# Patient Record
Sex: Female | Born: 1935 | Race: White | Hispanic: No | Smoking: Never smoker
Health system: Southern US, Community
[De-identification: ages and names within clinical notes are randomized; demographics above are authoritative.]

## PROBLEM LIST (undated history)

## (undated) DIAGNOSIS — M8580 Other specified disorders of bone density and structure, unspecified site: Secondary | ICD-10-CM

## (undated) DIAGNOSIS — R0600 Dyspnea, unspecified: Secondary | ICD-10-CM

## (undated) DIAGNOSIS — R413 Other amnesia: Secondary | ICD-10-CM

## (undated) DIAGNOSIS — R0609 Other forms of dyspnea: Secondary | ICD-10-CM

## (undated) DIAGNOSIS — F0390 Unspecified dementia without behavioral disturbance: Secondary | ICD-10-CM

## (undated) DIAGNOSIS — F4321 Adjustment disorder with depressed mood: Secondary | ICD-10-CM

## (undated) DIAGNOSIS — F4320 Adjustment disorder, unspecified: Secondary | ICD-10-CM

## (undated) HISTORY — DX: Other amnesia: R41.3

## (undated) HISTORY — PX: OOPHORECTOMY: SHX86

## (undated) HISTORY — DX: Adjustment disorder, unspecified: F43.20

## (undated) HISTORY — DX: Other forms of dyspnea: R06.09

## (undated) HISTORY — DX: Other specified disorders of bone density and structure, unspecified site: M85.80

## (undated) HISTORY — DX: Adjustment disorder with depressed mood: F43.21

## (undated) HISTORY — PX: ABDOMINAL HYSTERECTOMY: SHX81

## (undated) HISTORY — DX: Unspecified dementia, unspecified severity, without behavioral disturbance, psychotic disturbance, mood disturbance, and anxiety: F03.90

## (undated) HISTORY — DX: Dyspnea, unspecified: R06.00

---

## 1997-11-25 ENCOUNTER — Ambulatory Visit (HOSPITAL_COMMUNITY): Admission: RE | Admit: 1997-11-25 | Discharge: 1997-11-25 | Payer: Self-pay | Admitting: Gynecology

## 1999-02-28 ENCOUNTER — Other Ambulatory Visit: Admission: RE | Admit: 1999-02-28 | Discharge: 1999-02-28 | Payer: Self-pay | Admitting: Gynecology

## 2000-04-04 ENCOUNTER — Other Ambulatory Visit: Admission: RE | Admit: 2000-04-04 | Discharge: 2000-04-04 | Payer: Self-pay | Admitting: Gynecology

## 2000-07-09 ENCOUNTER — Ambulatory Visit (HOSPITAL_COMMUNITY): Admission: RE | Admit: 2000-07-09 | Discharge: 2000-07-09 | Payer: Self-pay | Admitting: Gastroenterology

## 2001-05-29 ENCOUNTER — Other Ambulatory Visit: Admission: RE | Admit: 2001-05-29 | Discharge: 2001-05-29 | Payer: Self-pay | Admitting: Gynecology

## 2002-06-08 ENCOUNTER — Other Ambulatory Visit: Admission: RE | Admit: 2002-06-08 | Discharge: 2002-06-08 | Payer: Self-pay | Admitting: Gynecology

## 2004-06-12 ENCOUNTER — Other Ambulatory Visit: Admission: RE | Admit: 2004-06-12 | Discharge: 2004-06-12 | Payer: Self-pay | Admitting: Gynecology

## 2005-06-28 ENCOUNTER — Other Ambulatory Visit: Admission: RE | Admit: 2005-06-28 | Discharge: 2005-06-28 | Payer: Self-pay | Admitting: Gynecology

## 2006-07-26 ENCOUNTER — Other Ambulatory Visit: Admission: RE | Admit: 2006-07-26 | Discharge: 2006-07-26 | Payer: Self-pay | Admitting: Gynecology

## 2007-07-31 ENCOUNTER — Other Ambulatory Visit: Admission: RE | Admit: 2007-07-31 | Discharge: 2007-07-31 | Payer: Self-pay | Admitting: Gynecology

## 2009-02-22 ENCOUNTER — Ambulatory Visit: Payer: Self-pay | Admitting: Gynecology

## 2009-02-22 ENCOUNTER — Other Ambulatory Visit: Admission: RE | Admit: 2009-02-22 | Discharge: 2009-02-22 | Payer: Self-pay | Admitting: Gynecology

## 2009-05-02 ENCOUNTER — Ambulatory Visit: Payer: Self-pay | Admitting: Gynecology

## 2010-08-04 ENCOUNTER — Encounter (INDEPENDENT_AMBULATORY_CARE_PROVIDER_SITE_OTHER): Payer: Medicare Other | Admitting: Gynecology

## 2010-08-04 DIAGNOSIS — K648 Other hemorrhoids: Secondary | ICD-10-CM

## 2010-08-04 DIAGNOSIS — R82998 Other abnormal findings in urine: Secondary | ICD-10-CM

## 2010-08-04 DIAGNOSIS — N952 Postmenopausal atrophic vaginitis: Secondary | ICD-10-CM

## 2010-08-04 DIAGNOSIS — M949 Disorder of cartilage, unspecified: Secondary | ICD-10-CM

## 2010-08-04 DIAGNOSIS — M899 Disorder of bone, unspecified: Secondary | ICD-10-CM

## 2010-08-25 NOTE — Procedures (Signed)
[place]. [facility]  Patient:    Rita Torres, Rita Torres                     MRN: [id number] Proc. Date: 07/09/00 Adm. Date:  [id number] Attending:  Charna Elizabeth CC:         Timothy P. Fontaine, M.D.                           Procedure Report  DATE OF BIRTH:  01-Jan-1933  REFERRING PHYSICIAN:  Nadyne Coombes. Fontaine, M.D.  PROCEDURE PERFORMED:  Colonoscopy.  ENDOSCOPIST:  Anselmo Rod, M.D.  INSTRUMENT USED:  Olympus video colonoscope.  INDICATIONS FOR PROCEDURE:  Screening colonoscopy being performed in a 77 year old white female to rule out colonic polyps, masses, hemorrhoids, etc.  PREPROCEDURE PREPARATION:  Informed consent was procured from the patient. The patient was fasted for eight hours prior to the procedure and prepped with a bottle of magnesium citrate and a gallon of NuLytely the night prior to the procedure.  PREPROCEDURE PHYSICAL:  The patient had stable vital signs.  Neck supple. Chest clear to auscultation.  S1, S2 regular.  Abdomen soft with normal abdominal bowel sounds.  DESCRIPTION OF PROCEDURE:  The patient was placed in the left lateral decubitus position and sedated with 40 mg of Demerol and 5 mg of Versed intravenously.  Once the patient was adequately sedated and maintained on low-flow oxygen and continuous cardiac monitoring, the Olympus video colonoscope was advanced from the rectum to the cecum and terminal ileum without difficulty.  The entire exam was normal except for small nonbleeding internal and external hemorrhoids.  No other abnormalities were appreciated.  No masses or polyps were seen.  IMPRESSION: 1. Normal colonoscopy up to the terminal ileum. 2. Small nonbleeding internal and external hemorrhoids.  RECOMMENDATIONS:  The patient has been advised to increase the fluid and fiber in the diet and follow up in the office as the need arises.DD:  07/09/00 TD:  07/09/00 Job: [id number]

## 2011-03-09 ENCOUNTER — Other Ambulatory Visit: Payer: Self-pay | Admitting: *Deleted

## 2011-03-09 ENCOUNTER — Encounter: Payer: Self-pay | Admitting: Gynecology

## 2011-03-12 ENCOUNTER — Encounter: Payer: Self-pay | Admitting: Gynecology

## 2011-03-13 ENCOUNTER — Encounter: Payer: Self-pay | Admitting: Gynecology

## 2012-05-27 ENCOUNTER — Encounter: Payer: Self-pay | Admitting: Gynecology

## 2012-06-04 ENCOUNTER — Encounter: Payer: Self-pay | Admitting: Gynecology

## 2012-06-06 ENCOUNTER — Encounter: Payer: Self-pay | Admitting: Gynecology

## 2012-06-06 ENCOUNTER — Ambulatory Visit (INDEPENDENT_AMBULATORY_CARE_PROVIDER_SITE_OTHER): Payer: Medicare Other | Admitting: Gynecology

## 2012-06-06 VITALS — BP 120/70 | Ht 64.0 in | Wt 169.0 lb

## 2012-06-06 DIAGNOSIS — N3281 Overactive bladder: Secondary | ICD-10-CM

## 2012-06-06 DIAGNOSIS — M899 Disorder of bone, unspecified: Secondary | ICD-10-CM

## 2012-06-06 DIAGNOSIS — N952 Postmenopausal atrophic vaginitis: Secondary | ICD-10-CM

## 2012-06-06 DIAGNOSIS — N318 Other neuromuscular dysfunction of bladder: Secondary | ICD-10-CM

## 2012-06-06 DIAGNOSIS — M8580 Other specified disorders of bone density and structure, unspecified site: Secondary | ICD-10-CM | POA: Insufficient documentation

## 2012-06-06 NOTE — Progress Notes (Signed)
Rita Torres 02/11/1933 [id number]        79 y.o.  G2P2002 for followup exam.  Several issues that are below  Past medical history,surgical history, medications, allergies, family history and social history were all reviewed and documented in the EPIC chart. ROS:  Was performed and pertinent positives and negatives are included in the history.  Exam: Kim assistant Filed Vitals:   06/06/12 1401  BP: 120/70  Height: 5\' 4"  (1.626 m)  Weight: 169 lb (76.658 kg)   General appearance  Normal Skin grossly normal Head/Neck normal with no cervical or supraclavicular adenopathy thyroid normal Lungs  clear Cardiac RR, without RMG Abdominal  soft, nontender, without masses, organomegaly or hernia Breasts  examined lying and sitting without masses, retractions, discharge or axillary adenopathy. Pelvic  Ext/BUS/vagina  normal with atrophic changes  Adnexa  Without masses or tenderness    Anus and perineum  normal   Rectovaginal  normal sphincter tone without palpated masses or tenderness.    Assessment/Plan:  79 y.o. [id number] female for annual exam.   1. Atrophic genital changes. Patient is asymptomatic and we'll continue to follow. 2. Detrusor instability. Having to run to the bathroom at times but no incontinence. Discussed strategies to include decrease caffeine and more frequent bladder and being. Discussed medication and she is not interested in this. 3. Osteopenia. DEXA 02/2011 T score -1.3. Stable from prior DEXA. Had been on bisphosphonate previously but is off now. Plan repeat DEXA  in another year or 2.  Increase calcium vitamin D reviewed. 4. Mammography 05/2012. Continue with annual mammography. SBE reviewed. 5. Pap smear 2010. No Pap smear done today. No history of significant abnormalities. Status post hysterectomy for benign indications. Over the age of 34. We'll stop screening and she is comfortable with this. 6. Colonoscopy 2012. Repeat at their recommended  interval. 7. Health maintenance. No blood work done as it is all done through her primary physician's office. Followup one year, sooner as needed.    Dara Lords MD, 2:25 PM 06/06/2012

## 2012-06-06 NOTE — Patient Instructions (Signed)
Followup in 1 year for her exam.

## 2012-06-07 LAB — URINALYSIS W MICROSCOPIC + REFLEX CULTURE
Bacteria, UA: NONE SEEN
Bilirubin Urine: NEGATIVE
Casts: NONE SEEN
Crystals: NONE SEEN
Glucose, UA: NEGATIVE mg/dL
Hgb urine dipstick: NEGATIVE
Ketones, ur: NEGATIVE mg/dL
Nitrite: NEGATIVE
Protein, ur: NEGATIVE mg/dL
Specific Gravity, Urine: 1.018 (ref 1.005–1.030)
Urobilinogen, UA: 0.2 mg/dL (ref 0.0–1.0)
pH: 5.5 (ref 5.0–8.0)

## 2012-06-08 LAB — URINE CULTURE: Colony Count: 40000

## 2013-03-13 ENCOUNTER — Ambulatory Visit (INDEPENDENT_AMBULATORY_CARE_PROVIDER_SITE_OTHER): Payer: Medicare Other | Admitting: Women's Health

## 2013-03-13 ENCOUNTER — Encounter: Payer: Self-pay | Admitting: Women's Health

## 2013-03-13 DIAGNOSIS — R300 Dysuria: Secondary | ICD-10-CM

## 2013-03-13 LAB — URINALYSIS W MICROSCOPIC + REFLEX CULTURE
Bilirubin Urine: NEGATIVE
Glucose, UA: NEGATIVE mg/dL
Hgb urine dipstick: NEGATIVE
Nitrite: NEGATIVE
Protein, ur: NEGATIVE mg/dL
Specific Gravity, Urine: 1.025 (ref 1.005–1.030)
Urobilinogen, UA: 0.2 mg/dL (ref 0.0–1.0)
pH: 5.5 (ref 5.0–8.0)

## 2013-03-13 MED ORDER — CIPROFLOXACIN HCL 250 MG PO TABS: 250.0000 mg | ORAL_TABLET | Freq: Two times a day (BID) | ORAL | Status: DC

## 2013-03-13 MED ORDER — BETAMETHASONE VALERATE 0.1 % EX OINT: 1.0000 "application " | TOPICAL_OINTMENT | Freq: Two times a day (BID) | CUTANEOUS | Status: DC

## 2013-03-13 NOTE — Progress Notes (Signed)
Patient ID: Rita Torres, female   DOB: 03-02-1933, 79 y.o.   MRN: [id number] Presents with complaint of urinary frequency with urgency and slight burning with urination. States urine has a stronger odor than usual. Denies vaginal discharge, abdominal pain or fever. Right shoulder pain and immobility, had a steroid injection, and now has a  rash in the axilla. Helping to care for her husband who has numerous health  and mobility problems  Exam: Appears well, UA moderate leukocytes, 7-10 WBCs, heavy mucus, few bacteria. Right Axilla 3 cm erythemic area.  UTI  Plan: Cipro 250 twice daily for 3 days #6 prescription, proper use given and reviewed. Valisone 0.1% ointment twice daily small amount to right axilla. Avoid deodorant soft, make sure dry after showering. If no relief followup with primary care.

## 2013-03-13 NOTE — Patient Instructions (Signed)
Urinary Tract Infection  Urinary tract infections (UTIs) can develop anywhere along your urinary tract. Your urinary tract is your body's drainage system for removing wastes and extra water. Your urinary tract includes two kidneys, two ureters, a bladder, and a urethra. Your kidneys are a pair of bean-shaped organs. Each kidney is about the size of your fist. They are located below your ribs, one on each side of your spine.  CAUSES  Infections are caused by microbes, which are microscopic organisms, including fungi, viruses, and bacteria. These organisms are so small that they can only be seen through a microscope. Bacteria are the microbes that most commonly cause UTIs.  SYMPTOMS   Symptoms of UTIs may vary by age and gender of the patient and by the location of the infection. Symptoms in [place] women typically include a frequent and intense urge to urinate and a painful, burning feeling in the bladder or urethra during urination. Older women and men are more likely to be tired, shaky, and weak and have muscle aches and abdominal pain. A fever may mean the infection is in your kidneys. Other symptoms of a kidney infection include pain in your back or sides below the ribs, nausea, and vomiting.  DIAGNOSIS  To diagnose a UTI, your caregiver will ask you about your symptoms. Your caregiver also will ask to provide a urine sample. The urine sample will be tested for bacteria and white blood cells. White blood cells are made by your body to help fight infection.  TREATMENT   Typically, UTIs can be treated with medication. Because most UTIs are caused by a bacterial infection, they usually can be treated with the use of antibiotics. The choice of antibiotic and length of treatment depend on your symptoms and the type of bacteria causing your infection.  HOME CARE INSTRUCTIONS   If you were prescribed antibiotics, take them exactly as your caregiver instructs you. Finish the medication even if you feel better after you  have only taken some of the medication.   Drink enough water and fluids to keep your urine clear or pale yellow.   Avoid caffeine, tea, and carbonated beverages. They tend to irritate your bladder.   Empty your bladder often. Avoid holding urine for long periods of time.   Empty your bladder before and after sexual intercourse.   After a bowel movement, women should cleanse from front to back. Use each tissue only once.  SEEK MEDICAL CARE IF:    You have back pain.   You develop a fever.   Your symptoms do not begin to resolve within 3 days.  SEEK IMMEDIATE MEDICAL CARE IF:    You have severe back pain or lower abdominal pain.   You develop chills.   You have nausea or vomiting.   You have continued burning or discomfort with urination.  MAKE SURE YOU:    Understand these instructions.   Will watch your condition.   Will get help right away if you are not doing well or get worse.  Document Released: 01/03/2005 Document Revised: 09/25/2011 Document Reviewed: 05/04/2011  Exit[employer] Patient Information 2014 [employer].

## 2013-03-14 LAB — URINE CULTURE
Colony Count: NO GROWTH
Organism ID, Bacteria: NO GROWTH

## 2013-04-30 ENCOUNTER — Ambulatory Visit (INDEPENDENT_AMBULATORY_CARE_PROVIDER_SITE_OTHER): Payer: Medicare Other | Admitting: Gynecology

## 2013-04-30 ENCOUNTER — Encounter: Payer: Self-pay | Admitting: Gynecology

## 2013-04-30 DIAGNOSIS — R300 Dysuria: Secondary | ICD-10-CM

## 2013-04-30 LAB — URINALYSIS W MICROSCOPIC + REFLEX CULTURE
Bilirubin Urine: NEGATIVE
Casts: NONE SEEN
Glucose, UA: NEGATIVE mg/dL
Hgb urine dipstick: NEGATIVE
Ketones, ur: 15 mg/dL — AB
Leukocytes, UA: NEGATIVE
Nitrite: NEGATIVE
Protein, ur: 30 mg/dL — AB
RBC / HPF: NONE SEEN RBC/hpf (ref <3)
Specific Gravity, Urine: >1.03 — ABNORMAL HIGH (ref 1.005–1.030)
Urobilinogen, UA: 0.2 mg/dL (ref 0.0–1.0)
WBC, UA: NONE SEEN WBC/hpf (ref <3)
pH: 5.5 (ref 5.0–8.0)

## 2013-04-30 MED ORDER — NITROFURANTOIN MONOHYD MACRO 100 MG PO CAPS: 100.0000 mg | ORAL_CAPSULE | Freq: Two times a day (BID) | ORAL | Status: DC

## 2013-04-30 NOTE — Progress Notes (Signed)
Patient presents with several days of dysuria. Also mild frequency. No low back pain abdominal discomfort fever chills nausea vomiting diarrhea or constipation. Was treated for UTI earlier in December.  Exam was Radiographer, therapeuticKim Assistant Spine straight without CVA tenderness. Abdomen soft nontender without masses guarding rebound organomegaly. Pelvic external BUS vagina with atrophic changes. Bimanual exam without masses or tenderness.  Assessment and plan: Symptoms suggestive of an early mild UTI. Urinalysis is unremarkable. No evidence of vaginal infection. Will cover with Macrobid 100 mg twice a day x7 days. Followup if symptoms persist worsen or recur.

## 2013-04-30 NOTE — Patient Instructions (Signed)
Start antibiotic twice daily for 7 days. Followup if symptoms persist, worsen or recur.

## 2013-05-01 ENCOUNTER — Telehealth: Payer: Self-pay | Admitting: *Deleted

## 2013-05-01 NOTE — Telephone Encounter (Signed)
Left message for pt to call.

## 2013-05-01 NOTE — Telephone Encounter (Signed)
Pt informed with the below note. 

## 2013-05-01 NOTE — Telephone Encounter (Signed)
Message copied by Aura CampsWEBB, Adriana Lina L on Fri May 01, 2013  9:41 AM ------      Message from: [name], TIMOTHY P      Created: Thu Apr 30, 2013  4:46 PM       Call patient. She left the office without talking to me. I did want her to start antibiotics twice daily for 7 days in the event that she's has an early urinary tract infection. I prescribed this to her pharmacy and she can pick it up there. ------

## 2013-05-02 LAB — URINE CULTURE: Colony Count: 40000

## 2013-11-16 ENCOUNTER — Ambulatory Visit (INDEPENDENT_AMBULATORY_CARE_PROVIDER_SITE_OTHER): Payer: Medicare Other | Admitting: Gynecology

## 2013-11-16 ENCOUNTER — Encounter: Payer: Self-pay | Admitting: Gynecology

## 2013-11-16 VITALS — BP 130/82

## 2013-11-16 DIAGNOSIS — N318 Other neuromuscular dysfunction of bladder: Secondary | ICD-10-CM

## 2013-11-16 DIAGNOSIS — R350 Frequency of micturition: Secondary | ICD-10-CM

## 2013-11-16 DIAGNOSIS — N3281 Overactive bladder: Secondary | ICD-10-CM | POA: Insufficient documentation

## 2013-11-16 LAB — URINALYSIS W MICROSCOPIC + REFLEX CULTURE
Bilirubin Urine: NEGATIVE
Glucose, UA: NEGATIVE mg/dL
Hgb urine dipstick: NEGATIVE
Ketones, ur: NEGATIVE mg/dL
Leukocytes, UA: NEGATIVE
Nitrite: NEGATIVE
Protein, ur: NEGATIVE mg/dL
Specific Gravity, Urine: 1.02 (ref 1.005–1.030)
Urobilinogen, UA: 0.2 mg/dL (ref 0.0–1.0)
pH: 5.5 (ref 5.0–8.0)

## 2013-11-16 MED ORDER — MIRABEGRON ER 25 MG PO TB24: 25.0000 mg | ORAL_TABLET | Freq: Every day | ORAL | Status: DC

## 2013-11-16 NOTE — Patient Instructions (Signed)
Mirabegron extended-release tablets What is this medicine? MIRABEGRON (MIR a BEG ron) is used to treat overactive bladder. This medicine reduces the amount of bathroom visits. It may also help to control wetting accidents. This medicine may be used for other purposes; ask your health care provider or pharmacist if you have questions. COMMON BRAND NAME(S): Myrbetriq What should I tell my health care provider before I take this medicine? They need to know if you have any of these conditions: -difficulty passing urine -high blood pressure -kidney disease -liver disease -an unusual or allergic reaction to mirabegron, other medicines, foods, dyes, or preservatives -pregnant or trying to get pregnant -breast-feeding How should I use this medicine? Take this medicine by mouth with a glass of water. Follow the directions on the prescription label. Do not cut, crush or chew this medicine. You can take it with or without food. If it upsets your stomach, take it with food. Take your medicine at regular intervals. Do not take it more often than directed. Do not stop taking except on your doctor's advice. Talk to your pediatrician regarding the use of this medicine in children. Special care may be needed. Overdosage: If you think you've taken too much of this medicine contact a poison control center or emergency room at once. Overdosage: If you think you have taken too much of this medicine contact a poison control center or emergency room at once. NOTE: This medicine is only for you. Do not share this medicine with others. What if I miss a dose? If you miss a dose, take it as soon as you can. If it is almost time for your next dose, take only that dose. Do not take double or extra doses. What may interact with this medicine? -certain medicines for bladder problems like fesoterodine, oxybutynin, solifenacin, tolterodine -desipramine -digoxin -flecainide -ketoconazole -MAOIs like Carbex, Eldepryl,  Marplan, Nardil, and Parnate -metoprolol -propafenone -thioridazine -warfarin This list may not describe all possible interactions. Give your health care provider a list of all the medicines, herbs, non-prescription drugs, or dietary supplements you use. Also tell them if you smoke, drink alcohol, or use illegal drugs. Some items may interact with your medicine. What should I watch for while using this medicine? It may take 8 weeks to notice the full benefit from this medicine. You may need to limit your intake tea, coffee, caffeinated sodas, and alcohol. These drinks may make your symptoms worse. Visit your doctor or health care professional for regular checks on your progress. Check your blood pressure as directed. Ask your doctor or health care professional what your blood pressure should be and when you should contact him or her. What side effects may I notice from receiving this medicine? Side effects that you should report to your doctor or health care professional as soon as possible: -allergic reactions like skin rash, itching or hives, swelling of the face, lips, or tongue -chest pain or palpitations -severe or sudden headache -high blood pressure -fast, irregular heartbeat -redness, blistering, peeling or loosening of the skin, including inside the mouth -signs of infection - fever or chills, pain or difficulty passing urine -trouble passing urine or change in the amount of urine Side effects that usually do not require medical attention (Report these to your doctor or health care professional if they continue or are bothersome.): -constipation -dry eyes -joint pain -mild headache -nausea -runny nose This list may not describe all possible side effects. Call your doctor for medical advice about side effects. You may report   side effects to FDA at 1-800-FDA-1088. Where should I keep my medicine? Keep out of the reach of children. Store at room temperature between 15 and 30  degrees C (59 and 86 degrees F). Throw away any unused medicine after the expiration date. NOTE: This sheet is a summary. It may not cover all possible information. If you have questions about this medicine, talk to your doctor, pharmacist, or health care provider.  2015, [employer]vier/[employer] Standard. (2011-12-07 15:59:47)  

## 2013-11-16 NOTE — Progress Notes (Signed)
   80 year old who presented to the office today complaining of urinary frequency but no dysuria. Patient denies any back pain, patient denies any nausea, fever, chills or any vomiting. Patient has had this problem for quite some time. She has been seen on several occasions and urine cultures have identified no microorganisms. On further questioning the patient states that regardless of her fluid intake that she has to get up at night at least 3 times to void. The sensation wakes her up. She also urinates frequently throughout the day. She is currently on no medication. Patient states she suffers from no vaginal dryness or irritation and is not sexually active.  Exam: Back: No CVA tenderness Abdomen: Soft nontender no rebound guarding Pelvic: Bartholin urethra Skene glands with atrophic changes Vagina: Vaginal cuff intact, no discharge Bimanual exam no palpable masses or tenderness Rectal exam: Not done  Urinalysis negative  Assessment/plan: Patient with apparent detrusor dyssynergia. We discussed different treatment options. The patient will be started on Myrbetriq 25 mg daily. Literature information was provided. Risks benefits and pros and cons discussed. We will still some in her urine for for culture as well.

## 2013-11-18 ENCOUNTER — Other Ambulatory Visit: Payer: Self-pay | Admitting: Gynecology

## 2013-11-18 MED ORDER — NITROFURANTOIN MONOHYD MACRO 100 MG PO CAPS: 100.0000 mg | ORAL_CAPSULE | Freq: Two times a day (BID) | ORAL | Status: DC

## 2013-11-20 LAB — URINE CULTURE: Colony Count: 15000

## 2013-12-03 ENCOUNTER — Telehealth: Payer: Self-pay | Admitting: *Deleted

## 2013-12-03 ENCOUNTER — Other Ambulatory Visit: Payer: Medicare Other

## 2013-12-03 DIAGNOSIS — N390 Urinary tract infection, site not specified: Secondary | ICD-10-CM

## 2013-12-03 NOTE — Telephone Encounter (Signed)
Pt informed with the below note will be in today at 11:00 am, order placed

## 2013-12-03 NOTE — Telephone Encounter (Signed)
Pt was treated with Macrobid x 7 days at [facility] 11/16/13 completed course. Pt still c/o urgency requesting if refill could be given? Please advise

## 2013-12-03 NOTE — Telephone Encounter (Signed)
I would recommend a urine analysis and urine culture and sensitivity regardless of urinalysis results to see if she still has a bacterial infection and which antibiotics the bacteria is sensitive to.

## 2013-12-04 ENCOUNTER — Other Ambulatory Visit: Payer: Self-pay | Admitting: Women's Health

## 2013-12-04 ENCOUNTER — Telehealth: Payer: Self-pay | Admitting: *Deleted

## 2013-12-04 DIAGNOSIS — R350 Frequency of micturition: Secondary | ICD-10-CM

## 2013-12-04 LAB — URINALYSIS W MICROSCOPIC + REFLEX CULTURE
Bacteria, UA: NONE SEEN
Bilirubin Urine: NEGATIVE
Casts: NONE SEEN
Glucose, UA: NEGATIVE mg/dL
Hgb urine dipstick: NEGATIVE
Ketones, ur: NEGATIVE mg/dL
Leukocytes, UA: NEGATIVE
Nitrite: NEGATIVE
Protein, ur: NEGATIVE mg/dL
Specific Gravity, Urine: 1.021 (ref 1.005–1.030)
Urobilinogen, UA: 0.2 mg/dL (ref 0.0–1.0)
pH: 6 (ref 5.0–8.0)

## 2013-12-04 MED ORDER — PHENAZOPYRIDINE HCL 100 MG PO TABS: 100.0000 mg | ORAL_TABLET | Freq: Three times a day (TID) | ORAL | Status: DC | PRN

## 2013-12-04 NOTE — Telephone Encounter (Signed)
(  Dr.Fontaine patient) Pt was treated for UTI with Macrobid x 7 days on 11/16/13 dropped repeat u/a off on 12/03/13 negative. Pt still c/o waking up at night using bathroom 5-6 times last night, no pain other symptoms. Pt would like recommendations. Please advise

## 2013-12-04 NOTE — Telephone Encounter (Signed)
Telephone call, states feels she is getting another UTI. Reviewed UA  12/03/2013 with negative. States has frequency during the day but more so at night, was up 6 times last night. Will try pyridium 100 3 times daily, prescription called in and will discuss at office visit with Dr. Audie Box in September. Encouraged to drink more fluids during the day and stop after 7 PM, night lites for safety.

## 2013-12-29 ENCOUNTER — Ambulatory Visit (INDEPENDENT_AMBULATORY_CARE_PROVIDER_SITE_OTHER): Payer: Medicare Other | Admitting: Gynecology

## 2013-12-29 ENCOUNTER — Encounter: Payer: Self-pay | Admitting: Gynecology

## 2013-12-29 VITALS — BP 120/66 | Ht 64.0 in | Wt 169.0 lb

## 2013-12-29 DIAGNOSIS — M949 Disorder of cartilage, unspecified: Secondary | ICD-10-CM

## 2013-12-29 DIAGNOSIS — M899 Disorder of bone, unspecified: Secondary | ICD-10-CM

## 2013-12-29 DIAGNOSIS — M8580 Other specified disorders of bone density and structure, unspecified site: Secondary | ICD-10-CM

## 2013-12-29 DIAGNOSIS — N952 Postmenopausal atrophic vaginitis: Secondary | ICD-10-CM

## 2013-12-29 NOTE — Patient Instructions (Signed)
Call to Schedule your mammogram and bone density  Facilities in [place]: 1)  [facility], [place] Phone: [phone] 2)  [facility]. [address] [place] Phone: [phone] 3)  Dr. Isaiah Blakes at [facility] [address] Phone: [phone]     Mammogram A mammogram is an X-ray test to find changes in a woman's breast. You should get a mammogram if:  You are 78 years of age or older  You have risk factors.   Your doctor recommends that you have one.  BEFORE THE TEST  Do not schedule the test the week before your period, especially if your breasts are sore during this time.  On the day of your mammogram:  Wash your breasts and armpits well. After washing, do not put on any deodorant or talcum powder on until after your test.   Eat and drink as you usually do.   Take your medicines as usual.   If you are diabetic and take insulin, make sure you:   Eat before coming for your test.   Take your insulin as usual.   If you cannot keep your appointment, call before the appointment to cancel. Schedule another appointment.  TEST  You will need to undress from the waist up. You will put on a hospital gown.   Your breast will be put on the mammogram machine, and it will press firmly on your breast with a piece of plastic called a compression paddle. This will make your breast flatter so that the machine can X-ray all parts of your breast.   Both breasts will be X-rayed. Each breast will be X-rayed from above and from the side. An X-ray might need to be taken again if the picture is not good enough.   The mammogram will last about 15 to 30 minutes.  AFTER THE TEST Finding out the results of your test Ask when your test results will be ready. Make sure you get your test results.  Document Released: 06/22/2008 Document Revised: 03/15/2011 Document Reviewed:  06/22/2008 [facility] Patient Information 2012 [place].  You may obtain a copy of any labs that were done today by logging onto MyChart as outlined in the instructions provided with your AVS (after visit summary). The office will not call with normal lab results but certainly if there are any significant abnormalities then we will contact you.   Health Maintenance, Female A healthy lifestyle and preventative care can promote health and wellness.  Maintain regular health, dental, and eye exams.  Eat a healthy diet. Foods like vegetables, fruits, whole grains, low-fat dairy products, and lean protein foods contain the nutrients you need without too many calories. Decrease your intake of foods high in solid fats, added sugars, and salt. Get information about a proper diet from your caregiver, if necessary.  Regular physical exercise is one of the most important things you can do for your health. Most adults should get at least 150 minutes of moderate-intensity exercise (any activity that increases your heart rate and causes you to sweat) each week. In addition, most adults need muscle-strengthening exercises on 2 or more days a week.   Maintain a healthy weight. The body mass index (BMI) is a screening tool to identify possible weight problems. It provides an estimate of body fat based on height and weight. Your caregiver can help determine your BMI, and can help you achieve or maintain a healthy  weight. For adults 20 years and older:  A BMI below 18.5 is considered underweight.  A BMI of 18.5 to 24.9 is normal.  A BMI of 25 to 29.9 is considered overweight.  A BMI of 30 and above is considered obese.  Maintain normal blood lipids and cholesterol by exercising and minimizing your intake of saturated fat. Eat a balanced diet with plenty of fruits and vegetables. Blood tests for lipids and cholesterol should begin at age 47 and be repeated every 5 years. If your lipid or cholesterol levels  are high, you are over 50, or you are a high risk for heart disease, you may need your cholesterol levels checked more frequently.Ongoing high lipid and cholesterol levels should be treated with medicines if diet and exercise are not effective.  If you smoke, find out from your caregiver how to quit. If you do not use tobacco, do not start.  Lung cancer screening is recommended for adults aged 59 80 years who are at high risk for developing lung cancer because of a history of smoking. Yearly low-dose computed tomography (CT) is recommended for people who have at least a 30-pack-year history of smoking and are a current smoker or have quit within the past 15 years. A pack year of smoking is smoking an average of 1 pack of cigarettes a day for 1 year (for example: 1 pack a day for 30 years or 2 packs a day for 15 years). Yearly screening should continue until the smoker has stopped smoking for at least 15 years. Yearly screening should also be stopped for people who develop a health problem that would prevent them from having lung cancer treatment.  If you are pregnant, do not drink alcohol. If you are breastfeeding, be very cautious about drinking alcohol. If you are not pregnant and choose to drink alcohol, do not exceed 1 drink per day. One drink is considered to be 12 ounces (355 mL) of beer, 5 ounces (148 mL) of wine, or 1.5 ounces (44 mL) of liquor.  Avoid use of street drugs. Do not share needles with anyone. Ask for help if you need support or instructions about stopping the use of drugs.  High blood pressure causes heart disease and increases the risk of stroke. Blood pressure should be checked at least every 1 to 2 years. Ongoing high blood pressure should be treated with medicines, if weight loss and exercise are not effective.  If you are 84 to 78 years old, ask your caregiver if you should take aspirin to prevent strokes.  Diabetes screening involves taking a blood sample to check your  fasting blood sugar level. This should be done once every 3 years, after age 53, if you are within normal weight and without risk factors for diabetes. Testing should be considered at a younger age or be carried out more frequently if you are overweight and have at least 1 risk factor for diabetes.  Breast cancer screening is essential preventative care for women. You should practice "breast self-awareness." This means understanding the normal appearance and feel of your breasts and may include breast self-examination. Any changes detected, no matter how small, should be reported to a caregiver. Women in their 22s and 30s should have a clinical breast exam (CBE) by a caregiver as part of a regular health exam every 1 to 3 years. After age 65, women should have a CBE every year. Starting at age 78, women should consider having a mammogram (breast X-ray) every year. Women  who have a family history of breast cancer should talk to their caregiver about genetic screening. Women at a high risk of breast cancer should talk to their caregiver about having an MRI and a mammogram every year.  Breast cancer gene (BRCA)-related cancer risk assessment is recommended for women who have family members with BRCA-related cancers. BRCA-related cancers include breast, ovarian, tubal, and peritoneal cancers. Having family members with these cancers may be associated with an increased risk for harmful changes (mutations) in the breast cancer genes BRCA1 and BRCA2. Results of the assessment will determine the need for genetic counseling and BRCA1 and BRCA2 testing.  The Pap test is a screening test for cervical cancer. Women should have a Pap test starting at age 29. Between ages 63 and 63, Pap tests should be repeated every 2 years. Beginning at age 64, you should have a Pap test every 3 years as long as the past 3 Pap tests have been normal. If you had a hysterectomy for a problem that was not cancer or a condition that could  lead to cancer, then you no longer need Pap tests. If you are between ages 75 and 31, and you have had normal Pap tests going back 10 years, you no longer need Pap tests. If you have had past treatment for cervical cancer or a condition that could lead to cancer, you need Pap tests and screening for cancer for at least 20 years after your treatment. If Pap tests have been discontinued, risk factors (such as a new sexual partner) need to be reassessed to determine if screening should be resumed. Some women have medical problems that increase the chance of getting cervical cancer. In these cases, your caregiver may recommend more frequent screening and Pap tests.  The human papillomavirus (HPV) test is an additional test that may be used for cervical cancer screening. The HPV test looks for the virus that can cause the cell changes on the cervix. The cells collected during the Pap test can be tested for HPV. The HPV test could be used to screen women aged 42 years and older, and should be used in women of any age who have unclear Pap test results. After the age of 25, women should have HPV testing at the same frequency as a Pap test.  Colorectal cancer can be detected and often prevented. Most routine colorectal cancer screening begins at the age of 42 and continues through age 19. However, your caregiver may recommend screening at an earlier age if you have risk factors for colon cancer. On a yearly basis, your caregiver may provide home test kits to check for hidden blood in the stool. Use of a small camera at the end of a tube, to directly examine the colon (sigmoidoscopy or colonoscopy), can detect the earliest forms of colorectal cancer. Talk to your caregiver about this at age 30, when routine screening begins. Direct examination of the colon should be repeated every 5 to 10 years through age 35, unless early forms of pre-cancerous polyps or small growths are found.  Hepatitis C blood testing is  recommended for all people born from [birth year] through 1963 and any individual with known risks for hepatitis C.  Practice safe sex. Use condoms and avoid high-risk sexual practices to reduce the spread of sexually transmitted infections (STIs). Sexually active women aged 14 and younger should be checked for Chlamydia, which is a common sexually transmitted infection. Older women with new or multiple partners should also be tested for  Chlamydia. Testing for other STIs is recommended if you are sexually active and at increased risk.  Osteoporosis is a disease in which the bones lose minerals and strength with aging. This can result in serious bone fractures. The risk of osteoporosis can be identified using a bone density scan. Women ages 32 and over and women at risk for fractures or osteoporosis should discuss screening with their caregivers. Ask your caregiver whether you should be taking a calcium supplement or vitamin D to reduce the rate of osteoporosis.  Menopause can be associated with physical symptoms and risks. Hormone replacement therapy is available to decrease symptoms and risks. You should talk to your caregiver about whether hormone replacement therapy is right for you.  Use sunscreen. Apply sunscreen liberally and repeatedly throughout the day. You should seek shade when your shadow is shorter than you. Protect yourself by wearing long sleeves, pants, a wide-brimmed hat, and sunglasses year round, whenever you are outdoors.  Notify your caregiver of new moles or changes in moles, especially if there is a change in shape or color. Also notify your caregiver if a mole is larger than the size of a pencil eraser.  Stay current with your immunizations. Document Released: 10/09/2010 Document Revised: 07/21/2012 Document Reviewed: 10/09/2010 [facility] Patient Information 2014 [place].

## 2013-12-29 NOTE — Progress Notes (Signed)
Rita Torres 1933/02/25 [id number]        80 y.o.  G2P2002 for follow up exam. Several issues noted below.  Past medical history,surgical history, problem list, medications, allergies, family history and social history were all reviewed and documented as reviewed in the EPIC chart.  ROS:  12 system ROS performed with pertinent positives and negatives included in the history, assessment and plan.   Additional significant findings :  none   Exam: [facility] person Vitals:   12/29/13 1409  BP: 120/66  Height:  (1.626 m)  Weight: 169 lb (76.658 kg)   General appearance:  Normal affect, orientation and appearance. Skin: Grossly normal HEENT: Without gross lesions.  No cervical or supraclavicular adenopathy. Thyroid normal.  Lungs:  Clear without wheezing, rales or rhonchi Cardiac: RR, without RMG Abdominal:  Soft, nontender, without masses, guarding, rebound, organomegaly or hernia Breasts:  Examined lying and sitting without masses, retractions, discharge or axillary adenopathy. Pelvic:  Ext/BUS/vagina with generalized atrophic changes  Adnexa  Without masses or tenderness    Anus and perineum  Normal   Rectovaginal  Normal sphincter tone without palpated masses or tenderness.    Assessment/Plan:  80 y.o. [id number] female for follow up exam.   1. Postmenopausal/atrophic genital changes. Patient without significant symptoms of hot flushes, night sweats, vaginal dryness. Continue to monitor. 2. Osteopenia.  DEXA 02/2011 T score -1.3. Repeat DEXA now when she schedules her mammograms she agrees to do so. Increase calcium and vitamin D reviewed. 3. Pap smear 2010. No Pap smear done today. No history of significant abnormal Pap smears. Status post hysterectomy for benign indications.  Over the age of 35. We both agreed to stop screening and she is comfortable with this. 4. Mammography 03/2013. Patient knows she's overdue and agrees to schedule along with her bone density at [facility].  SBE monthly reviewed. 5. Colonoscopy 2012. Repeat at their recommended interval. 6. Health maintenance. No routine blood work done as she reports this done at her primary physician's office. Follow up one year, sooner as needed.     Dara Lords MD, 2:43 PM 12/29/2013

## 2013-12-30 LAB — URINALYSIS W MICROSCOPIC + REFLEX CULTURE
Bacteria, UA: NONE SEEN
Bilirubin Urine: NEGATIVE
Casts: NONE SEEN
Crystals: NONE SEEN
Glucose, UA: NEGATIVE mg/dL
Hgb urine dipstick: NEGATIVE
Ketones, ur: NEGATIVE mg/dL
Leukocytes, UA: NEGATIVE
Nitrite: NEGATIVE
Protein, ur: NEGATIVE mg/dL
Specific Gravity, Urine: 1.021 (ref 1.005–1.030)
Urobilinogen, UA: 1 mg/dL (ref 0.0–1.0)
pH: 6 (ref 5.0–8.0)

## 2014-02-08 ENCOUNTER — Encounter: Payer: Self-pay | Admitting: Gynecology

## 2015-02-07 ENCOUNTER — Encounter: Payer: Self-pay | Admitting: Gynecology

## 2015-03-31 ENCOUNTER — Encounter: Payer: Self-pay | Admitting: Gynecology

## 2015-03-31 ENCOUNTER — Ambulatory Visit (INDEPENDENT_AMBULATORY_CARE_PROVIDER_SITE_OTHER): Payer: Medicare Other | Admitting: Gynecology

## 2015-03-31 VITALS — BP 120/76 | Ht 64.0 in | Wt 165.0 lb

## 2015-03-31 DIAGNOSIS — Z01419 Encounter for gynecological examination (general) (routine) without abnormal findings: Secondary | ICD-10-CM | POA: Diagnosis not present

## 2015-03-31 DIAGNOSIS — N8111 Cystocele, midline: Secondary | ICD-10-CM

## 2015-03-31 DIAGNOSIS — N952 Postmenopausal atrophic vaginitis: Secondary | ICD-10-CM | POA: Diagnosis not present

## 2015-03-31 DIAGNOSIS — M8580 Other specified disorders of bone density and structure, unspecified site: Secondary | ICD-10-CM | POA: Diagnosis not present

## 2015-03-31 DIAGNOSIS — R21 Rash and other nonspecific skin eruption: Secondary | ICD-10-CM | POA: Diagnosis not present

## 2015-03-31 NOTE — Patient Instructions (Signed)
Follow up for the bone density as you scheduled in the office.  Make appointment to see her primary physician for a health physical and lab work.

## 2015-03-31 NOTE — Progress Notes (Signed)
Rita Torres 08/23/1932 [id number]        81 y.o.  [id number]  for breast and pelvic exam. Several issues noted below.  Past medical history,surgical history, problem list, medications, allergies, family history and social history were all reviewed and documented as reviewed in the EPIC chart.  ROS:  Performed with pertinent positives and negatives included in the history, assessment and plan.   Additional significant findings :  none   Exam: [facility] personassistant Filed Vitals:   03/31/15 1516  BP: 120/76  Height: 5\' 4"  (1.626 m)  Weight: 165 lb (74.844 kg)   General appearance:  Normal affect, orientation and appearance. Skin: Grossly normal excepting small erythematous patch-like rash right corner of her panniculus consistent with fungal HEENT: Without gross lesions.  No cervical or supraclavicular adenopathy. Thyroid normal.  Lungs:  Clear without wheezing, rales or rhonchi Cardiac: RR, without RMG Abdominal:  Soft, nontender, without masses, guarding, rebound, organomegaly or hernia Breasts:  Examined lying and sitting without masses, retractions, discharge or axillary adenopathy. Pelvic:  Ext/BUS/vagina with atrophic changes. First-degree cystocele noted.  Adnexa  Without masses or tenderness    Anus and perineum  Normal   Rectovaginal  Normal sphincter tone without palpated masses or tenderness.    Assessment/Plan:  81 y.o. [id number] female for breast and pelvic exam.   1. Postmenopausal/atrophic genital changes.  Without significant hot flushes, night sweats or vaginal dryness. Continue to monitor report any issues. 2. Osteopenia. DEXA 2012 T score -1.3. I asked her to schedule a repeat DEXA but she did not do this last year. I asked her again this year to go ahead scheduled this and she agrees to do so. Increased calcium and vitamin D reviewed. 3. Cystocele. Patient has a first-degree cystocele. Patient doing well without significant urinary symptoms such as frequency dysuria  urgency or incontinence. Check urinalysis today. Continue to monitor. 4. Fungal appearing rash right corner of her panniculus. Recommended OTC dermatologic antifungal at bedtime. Follow up if persists or worsens. 5. Issues with memory. Patient notes having issues with memory. She notes it seems to followed her husband's death this past spring. Reviewed with the patient this is very common after a death of his spouse when your mind is being preoccupied and overwhelmed. She is not having more concerning symptoms such as not remembering how to get home, forgetting close relatives names or leaving appliances on. Will monitor at present and if seems to persist/worsen she's going to follow up with her primary physician. 6. Mammography 01/2015. Continue with annual mammography. SBE monthly reviewed. 7. Pap smear 2010. No Pap smear done today. No history of significant abnormal Pap smears. Based on age and hysterectomy for benign indications we both agreed to stop screening per current screening guidelines. 8. Colonoscopy 2012. Repeat at their recommended interval. 9. Health maintenance. She reports not having seen her primary physician for some time. I strongly recommended she follow up with her primary physician for general health screening and lab work. The patient agrees to arrange. Follow up for her bone density otherwise 1 year, sooner as needed.   [name],Rita Torres P MD, 3:50 PM 03/31/2015

## 2015-04-01 LAB — URINALYSIS W MICROSCOPIC + REFLEX CULTURE
Bilirubin Urine: NEGATIVE
Casts: NONE SEEN [LPF]
Glucose, UA: NEGATIVE
Hgb urine dipstick: NEGATIVE
Nitrite: NEGATIVE
Protein, ur: NEGATIVE
RBC / HPF: NONE SEEN RBC/HPF (ref <=2)
Specific Gravity, Urine: 1.027 (ref 1.001–1.035)
Yeast: NONE SEEN [HPF]
pH: 5.5 (ref 5.0–8.0)

## 2015-04-02 LAB — URINE CULTURE: Colony Count: 3000

## 2015-04-10 DIAGNOSIS — M8580 Other specified disorders of bone density and structure, unspecified site: Secondary | ICD-10-CM

## 2015-04-10 HISTORY — DX: Other specified disorders of bone density and structure, unspecified site: M85.80

## 2015-04-12 ENCOUNTER — Other Ambulatory Visit: Payer: Self-pay | Admitting: Gynecology

## 2015-04-12 ENCOUNTER — Ambulatory Visit (INDEPENDENT_AMBULATORY_CARE_PROVIDER_SITE_OTHER): Payer: Medicare Other

## 2015-04-12 ENCOUNTER — Telehealth: Payer: Self-pay | Admitting: Gynecology

## 2015-04-12 DIAGNOSIS — M8580 Other specified disorders of bone density and structure, unspecified site: Secondary | ICD-10-CM

## 2015-04-12 DIAGNOSIS — M8589 Other specified disorders of bone density and structure, multiple sites: Secondary | ICD-10-CM

## 2015-04-12 DIAGNOSIS — M899 Disorder of bone, unspecified: Secondary | ICD-10-CM

## 2015-04-12 NOTE — Telephone Encounter (Signed)
Tell patient that her bone density does show an increased risk of hip fracture. Recommend office visit to discuss treatment options.  Recommend check baseline vitamin D

## 2015-04-13 NOTE — Telephone Encounter (Signed)
Left message for pt to call.

## 2015-04-13 NOTE — Telephone Encounter (Signed)
Pt informed, transferred

## 2015-04-14 ENCOUNTER — Encounter: Payer: Self-pay | Admitting: Gynecology

## 2015-04-14 ENCOUNTER — Ambulatory Visit (INDEPENDENT_AMBULATORY_CARE_PROVIDER_SITE_OTHER): Payer: Medicare Other | Admitting: Gynecology

## 2015-04-14 VITALS — BP 124/76

## 2015-04-14 DIAGNOSIS — M899 Disorder of bone, unspecified: Secondary | ICD-10-CM | POA: Diagnosis not present

## 2015-04-14 DIAGNOSIS — M8589 Other specified disorders of bone density and structure, multiple sites: Secondary | ICD-10-CM

## 2015-04-14 DIAGNOSIS — M8580 Other specified disorders of bone density and structure, unspecified site: Secondary | ICD-10-CM

## 2015-04-14 NOTE — Patient Instructions (Addendum)
Follow up in one year when you're due for your annual exam We will plan on repeating your bone density in 2 years

## 2015-04-14 NOTE — Progress Notes (Signed)
[place] OldBarbara J Drinkard 25-Jul-1932 [id number]        82 y.o.  [id number] Presents to discuss her most recent bone density T score -1.8 FRAX 15%/3.8%  Past medical history,surgical history, problem list, medications, allergies, family history and social history were all reviewed and documented in the EPIC chart.  Directed ROS with pertinent positives and negatives documented in the history of present illness/assessment and plan.  Exam: Filed Vitals:   04/14/15 1107  BP: 124/76   General appearance:  Normal   Assessment/Plan:  82 y.o. [id number] with most recent DEXA T score -1.8 FRAX 15%/3.8%. 10 your calculated hip fracture risk exceeds 3% recommended threshold for treatment. [facility]. Unable to do a direct comparison but prior T score -1.3 right hip. The issues is whether we should initiate treatment now versus continued observation given that she is active/ambulatory with repeat DEXA in 2 years. Options for treatment reviewed. Side effects and risks discussed. After a lengthy discussion at this point the patient is not interested in treatment but prefers observation. I did recommend checking a vitamin D level today she is unclear when she has had a recent vitamin D level. I reviewed that this is one modifiable factor if low we could supplement. Patient go ahead and get that drawn today and otherwise we will plan on repeat DEXA in 2 years. She'll follow up in one year when she is due for her annual follow up.    [name],TIMOTHY P MD, 11:23 AM 04/14/2015

## 2015-04-15 LAB — VITAMIN D 25 HYDROXY (VIT D DEFICIENCY, FRACTURES): Vit D, 25-Hydroxy: 33 ng/mL (ref 30–100)

## 2016-02-19 ENCOUNTER — Emergency Department (HOSPITAL_COMMUNITY)
Admission: EM | Admit: 2016-02-19 | Discharge: 2016-02-19 | Disposition: A | Discharge status: Home / Self Care | Payer: Medicare Other | Attending: Emergency Medicine | Admitting: Emergency Medicine

## 2016-02-19 ENCOUNTER — Encounter (HOSPITAL_COMMUNITY): Payer: Self-pay

## 2016-02-19 DIAGNOSIS — H9201 Otalgia, right ear: Secondary | ICD-10-CM | POA: Diagnosis not present

## 2016-02-19 DIAGNOSIS — H61891 Other specified disorders of right external ear: Secondary | ICD-10-CM

## 2016-02-19 NOTE — ED Triage Notes (Signed)
Pt here with rt ear pain. States fluttering sensation and family feels they saw movement in ear.  Has been there for a week.

## 2016-02-19 NOTE — Discharge Instructions (Signed)
Follow-up with ENT if symptoms persist-- call to make appt. Return to the ED for new or worsening symptoms.

## 2016-02-19 NOTE — ED Provider Notes (Signed)
[facility]-EMERGENCY DEPT Provider Note   CSN: [id number] Arrival date & time: 02/19/16  1322  By signing my name below, I, Doreatha MartinEva Mathews, attest that this documentation has been prepared under the direction and in the presence of  Sharilyn SitesLisa Darly Massi, PA-C. Electronically Signed: Doreatha MartinEva Mathews, ED Scribe. 02/19/16. 1:49 PM.    History   Chief Complaint Chief Complaint  Patient presents with  . Otalgia    HPI Rita Torres is a 82 y.o. female who presents to the Emergency Department complaining of an intermittent foreign body sensation in her right ear for a week. Pt states her ear is not painful, but she describes the sensation that she suddenly feels a flutter or hears a buzzing noise. States when this happens she pushes down on the opening of her ear canal and it goes away. Pt states she does not specifically remember any objects getting into or bugs flying into the ear; however, reports that she was working out in her yard prior to the onset of her symptoms. No h/o of similar symptoms or ear problems. Pt notes she has attempted to clear the ear canal with a q-tip, but this provided no relief of the foreign body sensation. Pt denies similar symptoms in the left ear, hearing changes, dizziness, numbness, or weakness.  No fever, chills, sweats.  No cough or URI symptoms.   The history is provided by the patient. No language interpreter was used.    Past Medical History:  Diagnosis Date  . Memory loss   . Osteopenia 04/2015   t score -1.8    Patient Active Problem List   Diagnosis Date Noted  . OAB (overactive bladder) 11/16/2013  . Osteopenia     Past Surgical History:  Procedure Laterality Date  . ABDOMINAL HYSTERECTOMY     BSO  . OOPHORECTOMY     BSO    OB History    Gravida Para Term Preterm AB Living   2 2 2     2    SAB TAB Ectopic Multiple Live Births                   Home Medications    Prior to Admission medications   Medication Sig Start Date End Date Taking?  Authorizing Provider  Ascorbic Acid (VITAMIN C) 100 MG tablet Take 100 mg by mouth daily.    Historical Provider, MD  Omega-3 Fatty Acids (FISH OIL PO) Take by mouth.    Historical Provider, MD    Family History Family History  Problem Relation Age of Onset  . Heart disease Father     Social History Social History  Substance Use Topics  . Smoking status: Never Smoker  . Smokeless tobacco: Never Used  . Alcohol use No     Allergies   Penicillins   Review of Systems Review of Systems  Constitutional: Negative for fever.  HENT: Negative for ear pain and hearing loss.        +foreign body sensation right ear  All other systems reviewed and are negative.    Physical Exam Updated Vital Signs BP 135/65 (BP Location: Left Arm)   Pulse 72   Temp 97.5 F (36.4 C) (Oral)   Resp 18   SpO2 99%   Physical Exam  Constitutional: She is oriented to person, place, and time. She appears well-developed and well-nourished.  HENT:  Head: Normocephalic and atraumatic.  Mouth/Throat: Oropharynx is clear and moist.  Right ear normal in appearance, small amount of dried way  noted without cerumen impaction; TM is well visualized and clear; no signs of effusion or hemotympanum; hearing is normal, mastoids non-tender  Eyes: Conjunctivae and EOM are normal. Pupils are equal, round, and reactive to light.  Neck: Normal range of motion.  Cardiovascular: Normal rate, regular rhythm and normal heart sounds.   Pulmonary/Chest: Effort normal and breath sounds normal.  Abdominal: Soft. Bowel sounds are normal.  Musculoskeletal: Normal range of motion.  Neurological: She is alert and oriented to person, place, and time.  Awake, alert, oriented to baseline; moving all extremities with purposeful movements, no apparent ataxia, normal gait, speech clear and goal oriented  Skin: Skin is warm and dry.  Psychiatric: She has a normal mood and affect.  Nursing note and vitals reviewed.    ED  Treatments / Results   DIAGNOSTIC STUDIES: Oxygen Saturation is 99% on RA, normal by my interpretation.    COORDINATION OF CARE: 1:46 PM Discussed treatment plan with pt at bedside and pt agreed to plan.    Labs (all labs ordered are listed, but only abnormal results are displayed) Labs Reviewed - No data to display  EKG  EKG Interpretation None       Radiology No results found.  Procedures Procedures (including critical care time)  Medications Ordered in ED Medications - No data to display   Initial Impression / Assessment and Plan / ED Course  I have reviewed the triage vital signs and the nursing notes.  Pertinent labs & imaging results that were available during my care of the patient were reviewed by me and considered in my medical decision making (see chart for details).  Clinical Course    82 year old female here with foreign body sensation of her right ear. Reports this is been intermittent over the past week. Denies any known foreign body exposure, however has been working in her garden recently.  Right ear is normal in appearance without visualized foreign body or signs of infection.  Hearing is normal on exam, no apparent tinnitus.  She is ambulatory here with steady gait.  Denies dizziness, nausea, vomiting.  Does not appear to be vertigo at this time. Case discussed with attending physician, Dr. Verdie MosherLiu, does not recommend further work-up as there are no abnormalities noted on exam and symptoms are atypical for any acute neurologic process.  Will refer to ENT if symptoms persist.  Discussed plan with patient and family, acknowledged understanding and agreed with plan of care.  Return precautions given for new or worsening symptoms.  Final Clinical Impressions(s) / ED Diagnoses   Final diagnoses:  Foreign body sensation in ear canal, right    New Prescriptions New Prescriptions   No medications on file   I personally performed the services described in this  documentation, which was scribed in my presence. The recorded information has been reviewed and is accurate.   Garlon HatchetLisa M Lizmary Nader, PA-C 02/19/16 1407    Lavera Guiseana Duo Liu, MD 02/19/16 2126

## 2016-04-17 DIAGNOSIS — H524 Presbyopia: Secondary | ICD-10-CM | POA: Diagnosis not present

## 2016-04-17 DIAGNOSIS — Z961 Presence of intraocular lens: Secondary | ICD-10-CM | POA: Diagnosis not present

## 2016-04-19 ENCOUNTER — Encounter: Payer: Self-pay | Admitting: Gynecology

## 2016-04-19 ENCOUNTER — Ambulatory Visit (INDEPENDENT_AMBULATORY_CARE_PROVIDER_SITE_OTHER): Payer: Medicare Other | Admitting: Gynecology

## 2016-04-19 VITALS — BP 124/78 | Ht 63.0 in | Wt 171.0 lb

## 2016-04-19 DIAGNOSIS — M8580 Other specified disorders of bone density and structure, unspecified site: Secondary | ICD-10-CM

## 2016-04-19 DIAGNOSIS — N952 Postmenopausal atrophic vaginitis: Secondary | ICD-10-CM

## 2016-04-19 DIAGNOSIS — N8111 Cystocele, midline: Secondary | ICD-10-CM | POA: Diagnosis not present

## 2016-04-19 DIAGNOSIS — Z01411 Encounter for gynecological examination (general) (routine) with abnormal findings: Secondary | ICD-10-CM | POA: Diagnosis not present

## 2016-04-19 LAB — URINALYSIS W MICROSCOPIC + REFLEX CULTURE
Bacteria, UA: NONE SEEN [HPF]
Bilirubin Urine: NEGATIVE
Casts: NONE SEEN [LPF]
Crystals: NONE SEEN [HPF]
Glucose, UA: NEGATIVE
Hgb urine dipstick: NEGATIVE
Ketones, ur: NEGATIVE
Nitrite: NEGATIVE
Protein, ur: NEGATIVE
Specific Gravity, Urine: 1.02 (ref 1.001–1.035)
Yeast: NONE SEEN [HPF]
pH: 5.5 (ref 5.0–8.0)

## 2016-04-19 NOTE — Patient Instructions (Signed)
Schedule your mammogram;

## 2016-04-19 NOTE — Progress Notes (Signed)
    Rita Torres [birth year] [id number]        83 y.o.  G2P2002 for breast and pelvic exam  Past medical history,surgical history, problem list, medications, allergies, family history and social history were all reviewed and documented as reviewed in the EPIC chart.  ROS:  Performed with pertinent positives and negatives included in the history, assessment and plan.   Additional significant findings :  None   Exam: Rita PortelaKim Torres assistant Vitals:   04/19/16 1422  BP: 124/78  Weight: 171 lb (77.6 kg)  Height: 5\' 3"  (1.6 m)   Body mass index is 30.29 kg/m.  General appearance:  Normal affect, orientation and appearance. Skin: Grossly normal HEENT: Without gross lesions.  No cervical or supraclavicular adenopathy. Thyroid normal.  Lungs:  Clear without wheezing, rales or rhonchi Cardiac: RR, without RMG Abdominal:  Soft, nontender, without masses, guarding, rebound, organomegaly or hernia Breasts:  Examined lying and sitting without masses, retractions, discharge or axillary adenopathy. Pelvic:  Ext, BUS, Vagina with atrophic changes and with mild cystocele noted  Adnexa without masses or tenderness    Anus and perineum normal   Rectovaginal normal sphincter tone without palpated masses or tenderness.    Assessment/Plan:  83 y.o. [id number] female for breast and pelvic exam.   1. Postmenopausal/atrophic genital changes. No significant hot flushes, night sweats, vaginal dryness. 2. Mild cystocele. Asymptomatic to the patient. Check baseline urinalysis. Continue to monitor report any issues. 3. Osteopenia.  DEXA 04/2015 T score -1.8. FRAX 15%/3.8%. We discussed last year options for treatment based on increased hip fracture risk. Patient declined treatment and plans to repeat DEXA next year to year interval. Increase calcium vitamin D reviewed. 4. Mammography 01/2015. I reminded patient she is overdue and wrote her note on her after visit summary. Patient agrees to call and  schedule. 5. Pap smear 2010. No Pap smear done today. No history of significant abnormal Pap smears. We both agree to stop screening per current screening guidelines based on age and hysterectomy history. 6. Colonoscopy 2012. Repeat at their recommended interval. 7. Health maintenance. No routine lab work done as patient does this elsewhere. Follow up 1 year, sooner as needed.   Rita Torres,Rita Torres P MD, 2:47 PM 04/19/2016

## 2016-04-21 LAB — URINE CULTURE

## 2016-05-28 DIAGNOSIS — E785 Hyperlipidemia, unspecified: Secondary | ICD-10-CM | POA: Diagnosis not present

## 2016-05-28 DIAGNOSIS — D696 Thrombocytopenia, unspecified: Secondary | ICD-10-CM | POA: Diagnosis not present

## 2016-05-28 DIAGNOSIS — R413 Other amnesia: Secondary | ICD-10-CM | POA: Diagnosis not present

## 2016-06-08 DIAGNOSIS — L2089 Other atopic dermatitis: Secondary | ICD-10-CM | POA: Diagnosis not present

## 2016-07-09 ENCOUNTER — Encounter: Payer: Self-pay | Admitting: Neurology

## 2016-07-20 DIAGNOSIS — L249 Irritant contact dermatitis, unspecified cause: Secondary | ICD-10-CM | POA: Diagnosis not present

## 2016-07-20 DIAGNOSIS — Z85828 Personal history of other malignant neoplasm of skin: Secondary | ICD-10-CM | POA: Diagnosis not present

## 2016-07-20 DIAGNOSIS — L218 Other seborrheic dermatitis: Secondary | ICD-10-CM | POA: Diagnosis not present

## 2016-08-22 ENCOUNTER — Encounter: Payer: Self-pay | Admitting: Gynecology

## 2016-08-28 DIAGNOSIS — I83893 Varicose veins of bilateral lower extremities with other complications: Secondary | ICD-10-CM | POA: Diagnosis not present

## 2016-08-28 DIAGNOSIS — I83813 Varicose veins of bilateral lower extremities with pain: Secondary | ICD-10-CM | POA: Diagnosis not present

## 2016-09-12 ENCOUNTER — Encounter: Payer: Self-pay | Admitting: Neurology

## 2016-09-12 ENCOUNTER — Ambulatory Visit (INDEPENDENT_AMBULATORY_CARE_PROVIDER_SITE_OTHER): Payer: Medicare Other | Admitting: Neurology

## 2016-09-12 ENCOUNTER — Other Ambulatory Visit: Payer: Medicare Other

## 2016-09-12 VITALS — BP 122/66 | HR 69 | Ht 64.0 in | Wt 166.0 lb

## 2016-09-12 DIAGNOSIS — G3184 Mild cognitive impairment, so stated: Secondary | ICD-10-CM | POA: Diagnosis not present

## 2016-09-12 NOTE — Patient Instructions (Signed)
1. Bloodwork for TSH, B12 2. Schedule open MRI brain without contrast 3. Continue Aricept 10mg  daily 4. Control of blood pressure, cholesterol, as well as physical exercise and brain stimulation exercises are important for brain health 5. Continue to monitor driving 6. Follow-up in 6 months, call for any changes

## 2016-09-12 NOTE — Progress Notes (Signed)
NEUROLOGY CONSULTATION NOTE  Rita Torres MRN: [id number] DOB: [birth year]  Referring provider: Dr. Nadyne Coombes Primary care provider: Dr. Shelly Coss  Reason for consult:  Memory loss  Dear Dr Hal Hope:  Thank you for your kind referral of Rita Torres for consultation of the above symptoms. Although her history is well known to you, please allow me to reiterate it for the purpose of our medical record. The patient was accompanied to the clinic by her son and daughter-in-law who also provide collateral information. Records and images were personally reviewed where available.  HISTORY OF PRESENT ILLNESS: This is a pleasant 83 year old right-handed woman with no significant past medical history presenting for evaluation of worsening memory. Her family started noticing memory changes around a year ago where she was noted to have a lot of word-finding difficulties. She was started on Aricept at that time, which she only took intermittently because she felt she was not getting anywhere with it. When she stopped it, she feels there was a difference with worsening of memory. She lives alone and denies any missed bill payments, no difficulties with ADLs, no hygiene concerns from family. Her family became concerned when she got lost driving around 2 months ago going to their house. Her son denies that she repeats herself excessively, no personality changes or hallucinations.  She has occasional back pain, occasional numbness and tingling below the knees. She has occasional tremors in her hands, R>L. She denies any headaches, dizziness, diplopia, dysarthria/dysphagia, neck pain, bowel/bladder dysfunction, anosmia. No family history of dementia, no history of significant head injuries or alcohol use.    PAST MEDICAL HISTORY: Past Medical History:  Diagnosis Date  . Memory loss   . Osteopenia 04/2015   t score -1.8    PAST SURGICAL HISTORY: Past Surgical History:  Procedure  Laterality Date  . ABDOMINAL HYSTERECTOMY     BSO  . OOPHORECTOMY     BSO    MEDICATIONS: Current Outpatient Prescriptions on File Prior to Visit  Medication Sig Dispense Refill  . Ascorbic Acid (VITAMIN C) 100 MG tablet Take 100 mg by mouth daily.    . Cholecalciferol (VITAMIN D PO) Take by mouth.    . Donepezil HCl (ARICEPT PO) Take by mouth.     No current facility-administered medications on file prior to visit.     ALLERGIES: Allergies  Allergen Reactions  . Penicillins Rash    FAMILY HISTORY: Family History  Problem Relation Age of Onset  . Heart disease Father     SOCIAL HISTORY: Social History   Social History  . Marital status: Widowed    Spouse name: N/A  . Number of children: N/A  . Years of education: N/A   Occupational History  . Not on file.   Social History Main Topics  . Smoking status: Never Smoker  . Smokeless tobacco: Never Used  . Alcohol use No  . Drug use: No  . Sexual activity: No     Comment: HYST-1st intercourse 31 yo-1 partner   Other Topics Concern  . Not on file   Social History Narrative  . No narrative on file    REVIEW OF SYSTEMS: Constitutional: No fevers, chills, or sweats, no generalized fatigue, change in appetite Eyes: No visual changes, double vision, eye pain Ear, nose and throat: No hearing loss, ear pain, nasal congestion, sore throat Cardiovascular: No chest pain, palpitations Respiratory:  No shortness of breath at rest or with exertion, wheezes GastrointestinaI: No nausea, vomiting,  diarrhea, abdominal pain, fecal incontinence Genitourinary:  No dysuria, urinary retention or frequency Musculoskeletal:  No neck pain,+ back pain Integumentary: No rash, pruritus, skin lesions Neurological: as above Psychiatric: No depression, insomnia, anxiety Endocrine: No palpitations, fatigue, diaphoresis, mood swings, change in appetite, change in weight, increased thirst Hematologic/Lymphatic:  No anemia, purpura,  petechiae. Allergic/Immunologic: no itchy/runny eyes, nasal congestion, recent allergic reactions, rashes  PHYSICAL EXAM: Vitals:   09/12/16 1354  BP: 122/66  Pulse: 69   General: No acute distress Head:  Normocephalic/atraumatic Eyes: Fundoscopic exam shows bilateral sharp discs, no vessel changes, exudates, or hemorrhages Neck: supple, no paraspinal tenderness, full range of motion Back: No paraspinal tenderness Heart: regular rate and rhythm Lungs: Clear to auscultation bilaterally. Vascular: No carotid bruits. Skin/Extremities: No rash, no edema Neurological Exam: Mental status: alert and oriented to person, place, and time, no dysarthria or aphasia, Fund of knowledge is appropriate.  Recent and remote memory are impaired  Attention and concentration are normal.    Able to name objects and repeat phrases.  Montreal Cognitive Assessment  09/12/2016  Visuospatial/ Executive (0/5) 1  Naming (0/3) 2  Attention: Read list of digits (0/2) 2  Attention: Read list of letters (0/1) 1  Attention: Serial 7 subtraction starting at 100 (0/3) 1  Language: Repeat phrase (0/2) 2  Language : Fluency (0/1) 1  Abstraction (0/2) 2  Delayed Recall (0/5) 1  Orientation (0/6) 6  Total 19   Cranial nerves: CN I: not tested CN II: pupils equal, round and reactive to light, visual fields intact, fundi unremarkable. CN III, IV, VI:  full range of motion, no nystagmus, no ptosis CN V: facial sensation intact CN VII: upper and lower face symmetric CN VIII: hearing intact to finger rub CN IX, X: gag intact, uvula midline CN XI: sternocleidomastoid and trapezius muscles intact CN XII: tongue midline Bulk & Tone: normal, no cogwheeling, no fasciculations. Motor: 5/5 throughout with no pronator drift. Sensation: intact to light touch, cold, pin, vibration and joint position sense.  No extinction to double simultaneous stimulation.  Romberg test negative Deep Tendon Reflexes: +2 throughout, no ankle  clonus Plantar responses: downgoing bilaterally Cerebellar: no incoordination on finger to nose, heel to shin. No dysdiadochokinesia Gait: narrow-based and steady, able to tandem walk adequately. Tremor: none  IMPRESSION: This is a pleasant 83 year old right-handed woman with no significant past medical history, presenting for worsening memory. Her family got concerned after she got lost driving to their house 2 months ago. Her neurological exam is normal, MOCA score today 19/30, indicating mild cognitive impairment, possible mild dementia. We discussed different causes of memory loss. Check TSH and B12. MRI brain without contrast will be ordered to assess for underlying structural abnormality and assess vascular load. We discussed expectations from Aricept, continue 10mg  daily. We discussed driving, at this time would recommend restricting driving to avoid interstate, no driving in poor weather conditions or after dark, continue to monitor driving. We discussed the importance of control of vascular risk factors, physical exercise, and brain stimulation exercises for brain health. She will follow-up in 6 months and knows to call for any changes.   Thank you for allowing me to participate in the care of this patient. Please do not hesitate to call for any questions or concerns.   Patrcia DollyKaren Briahnna Harries, M.D.  CC: Dr. Jeannetta NapElkins, Dr. Hal Hopeichter

## 2016-09-13 ENCOUNTER — Telehealth: Payer: Self-pay

## 2016-09-13 LAB — TSH: TSH: 2.26 mIU/L

## 2016-09-13 LAB — VITAMIN B12: Vitamin B-12: 1516 pg/mL — ABNORMAL HIGH (ref 200–1100)

## 2016-09-13 NOTE — Telephone Encounter (Signed)
LMOM relaying message below.  

## 2016-09-13 NOTE — Telephone Encounter (Signed)
-----   Message from Van ClinesKaren M Aquino, MD sent at 09/13/2016 10:32 AM EDT ----- Pls let her know thyroid and B12 levels are normal. Thanks

## 2016-09-14 ENCOUNTER — Encounter: Payer: Self-pay | Admitting: Neurology

## 2016-09-14 DIAGNOSIS — G3184 Mild cognitive impairment, so stated: Secondary | ICD-10-CM | POA: Insufficient documentation

## 2016-10-05 DIAGNOSIS — Z961 Presence of intraocular lens: Secondary | ICD-10-CM | POA: Diagnosis not present

## 2016-10-05 DIAGNOSIS — H43813 Vitreous degeneration, bilateral: Secondary | ICD-10-CM | POA: Diagnosis not present

## 2016-10-05 DIAGNOSIS — H26493 Other secondary cataract, bilateral: Secondary | ICD-10-CM | POA: Diagnosis not present

## 2016-11-01 DIAGNOSIS — H26491 Other secondary cataract, right eye: Secondary | ICD-10-CM | POA: Diagnosis not present

## 2016-11-16 DIAGNOSIS — D692 Other nonthrombocytopenic purpura: Secondary | ICD-10-CM | POA: Diagnosis not present

## 2016-11-16 DIAGNOSIS — L820 Inflamed seborrheic keratosis: Secondary | ICD-10-CM | POA: Diagnosis not present

## 2016-11-16 DIAGNOSIS — L821 Other seborrheic keratosis: Secondary | ICD-10-CM | POA: Diagnosis not present

## 2016-11-16 DIAGNOSIS — Z85828 Personal history of other malignant neoplasm of skin: Secondary | ICD-10-CM | POA: Diagnosis not present

## 2016-11-28 DIAGNOSIS — L259 Unspecified contact dermatitis, unspecified cause: Secondary | ICD-10-CM | POA: Diagnosis not present

## 2016-12-13 DIAGNOSIS — H26492 Other secondary cataract, left eye: Secondary | ICD-10-CM | POA: Diagnosis not present

## 2017-01-08 DIAGNOSIS — Z23 Encounter for immunization: Secondary | ICD-10-CM | POA: Diagnosis not present

## 2017-03-11 ENCOUNTER — Ambulatory Visit: Payer: PRIVATE HEALTH INSURANCE | Admitting: Neurology

## 2017-03-25 ENCOUNTER — Ambulatory Visit (INDEPENDENT_AMBULATORY_CARE_PROVIDER_SITE_OTHER): Payer: Medicare Other | Admitting: Neurology

## 2017-03-25 ENCOUNTER — Encounter: Payer: Self-pay | Admitting: Neurology

## 2017-03-25 VITALS — BP 142/66 | HR 64 | Ht 65.0 in | Wt 164.0 lb

## 2017-03-25 DIAGNOSIS — G3184 Mild cognitive impairment, so stated: Secondary | ICD-10-CM

## 2017-03-25 DIAGNOSIS — R413 Other amnesia: Secondary | ICD-10-CM

## 2017-03-25 NOTE — Patient Instructions (Signed)
1. Schedule MRI brain without contrast 2. Continue Aricept 10mg  daily 3. Follow-up in 6 months, call for any changes  RECOMMENDATIONS FOR ALL PATIENTS WITH MEMORY PROBLEMS: 1. Continue to exercise (Recommend 30 minutes of walking everyday, or 3 hours every week) 2. Increase social interactions - continue going to [place] and enjoy social gatherings with friends and family 3. Eat healthy, avoid fried foods and eat more fruits and vegetables 4. Maintain adequate blood pressure, blood sugar, and blood cholesterol level. Reducing the risk of stroke and cardiovascular disease also helps promoting better memory. 5. Avoid stressful situations. Live a simple life and avoid aggravations. Organize your time and prepare for the next day in anticipation. 6. Sleep well, avoid any interruptions of sleep and avoid any distractions in the bedroom that may interfere with adequate sleep quality 7. Avoid sugar, avoid sweets as there is a strong link between excessive sugar intake, diabetes, and cognitive impairment We discussed the Mediterranean diet, which has been shown to help patients reduce the risk of progressive memory disorders and reduces cardiovascular risk. This includes eating fish, eat fruits and green leafy vegetables, nuts like almonds and hazelnuts, walnuts, and also use olive oil. Avoid fast foods and fried foods as much as possible. Avoid sweets and sugar as sugar use has been linked to worsening of memory function.  There is always a concern of gradual progression of memory problems. If this is the case, then we may need to adjust level of care according to patient needs. Support, both to the patient and caregiver, should then be put into place.

## 2017-03-25 NOTE — Progress Notes (Signed)
NEUROLOGY FOLLOW UP OFFICE NOTE  [name] [id number] [birth year]  HISTORY OF PRESENT ILLNESS: I had the pleasure of seeing Rita FellowsBarbara Torres in follow-up in the neurology clinic on 03/25/2017.  The patient was last seen on 6 months ago for mild cognitive impairment and is again accompanied by her daughter-in-law who helps supplement the history today.  Records and images were personally reviewed where available.  She was not able to do MRI brain, ?scheduling issue. She feels her memory is "sort of up and down." She tries to remember things when she needs to. She continues to live alone and denies getting lost driving, no missed bills or medications. Her daughter-in-law feels she is doing good, not any worse. She does endorse word-finding difficulties, which "drives me up the wall." She is taking Aricept 10mg  daily without side effects. She has occasional back pain, occasional numbness and tingling below the knees. She has occasional tremors in her hands, R>L. She denies any headaches, dizziness, diplopia, dysarthria/dysphagia, neck pain, bowel/bladder dysfunction, anosmia.   HPI 09/12/2016: This is a pleasant 83 yo RH woman with no significant past medical history with worsening memory. Her family started noticing memory changes around a year ago where she was noted to have a lot of word-finding difficulties. She was started on Aricept at that time, which she only took intermittently because she felt she was not getting anywhere with it. When she stopped it, she feels there was a difference with worsening of memory. She lives alone and denies any missed bill payments, no difficulties with ADLs, no hygiene concerns from family. Her family became concerned when she got lost driving around 2 months ago going to their house. Her son denies that she repeats herself excessively, no personality changes or hallucinations.No family history of dementia, no history of significant head injuries or alcohol use.    PAST MEDICAL HISTORY: Past Medical History:  Diagnosis Date  . Memory loss   . Osteopenia 04/2015   t score -1.8    MEDICATIONS: Current Outpatient Medications on File Prior to Visit  Medication Sig Dispense Refill  . Ascorbic Acid (VITAMIN C) 100 MG tablet Take 100 mg by mouth daily.    . Cholecalciferol (VITAMIN D PO) Take by mouth.    . Donepezil HCl (ARICEPT PO) Take by mouth.     No current facility-administered medications on file prior to visit.     ALLERGIES: Allergies  Allergen Reactions  . Penicillins Rash    FAMILY HISTORY: Family History  Problem Relation Age of Onset  . Heart disease Father     SOCIAL HISTORY: Social History   Socioeconomic History  . Marital status: Widowed    Spouse name: Not on file  . Number of children: Not on file  . Years of education: Not on file  . Highest education level: Not on file  Social Needs  . Financial resource strain: Not on file  . Food insecurity - worry: Not on file  . Food insecurity - inability: Not on file  . Transportation needs - medical: Not on file  . Transportation needs - non-medical: Not on file  Occupational History  . Not on file  Tobacco Use  . Smoking status: Never Smoker  . Smokeless tobacco: Never Used  Substance and Sexual Activity  . Alcohol use: No    Alcohol/week: 0.0 oz  . Drug use: No  . Sexual activity: No    Birth control/protection: Surgical    Comment: HYST-1st intercourse 83 yo-1 partner  Other Topics Concern  . Not on file  Social History Narrative  . Not on file    REVIEW OF SYSTEMS: Constitutional: No fevers, chills, or sweats, no generalized fatigue, change in appetite Eyes: No visual changes, double vision, eye pain Ear, nose and throat: No hearing loss, ear pain, nasal congestion, sore throat Cardiovascular: No chest pain, palpitations Respiratory:  No shortness of breath at rest or with exertion, wheezes GastrointestinaI: No nausea, vomiting, diarrhea,  abdominal pain, fecal incontinence Genitourinary:  No dysuria, urinary retention or frequency Musculoskeletal:  No neck pain, back pain Integumentary: No rash, pruritus, skin lesions Neurological: as above Psychiatric: No depression, insomnia, anxiety Endocrine: No palpitations, fatigue, diaphoresis, mood swings, change in appetite, change in weight, increased thirst Hematologic/Lymphatic:  No anemia, purpura, petechiae. Allergic/Immunologic: no itchy/runny eyes, nasal congestion, recent allergic reactions, rashes  PHYSICAL EXAM: Vitals:   03/25/17 1600  BP: (!) 142/66  Pulse: 64  SpO2: 96%   General: No acute distress Head:  Normocephalic/atraumatic Neck: supple, no paraspinal tenderness, full range of motion Heart:  Regular rate and rhythm Lungs:  Clear to auscultation bilaterally Back: No paraspinal tenderness Skin/Extremities: No rash, no edema Neurological Exam: alert and oriented to person, place, and month/day of week. States year is 2012. No aphasia or dysarthria. Fund of knowledge is appropriate.  Remote memory intact.  Attention and concentration are normal.    Able to name objects and repeat phrases.  Montreal Cognitive Assessment  03/25/2017 09/12/2016  Visuospatial/ Executive (0/5) 3 1  Naming (0/3) 2 2  Attention: Read list of digits (0/2) 2 2  Attention: Read list of letters (0/1) 1 1  Attention: Serial 7 subtraction starting at 100 (0/3) 3 1  Language: Repeat phrase (0/2) 2 2  Language : Fluency (0/1) 0 1  Abstraction (0/2) 2 2  Delayed Recall (0/5) 0 1  Orientation (0/6) 4 6  Total 19 19   Cranial nerves: Pupils equal, round, reactive to light.  Extraocular movements intact with no nystagmus. Visual fields full. Facial sensation intact. No facial asymmetry. Tongue, uvula, palate midline.  Motor: Bulk and tone normal, muscle strength 5/5 throughout with no pronator drift.  Sensation to light touch intact.  No extinction to double simultaneous stimulation.  Deep  tendon reflexes 2+ throughout, toes downgoing.  Finger to nose testing intact.  Gait narrow-based and steady, able to tandem walk adequately.  Romberg negative.  IMPRESSION: This is a pleasant 83 yo RH woman with no significant past medical history, with mild cognitive impairment. Her family got concerned after she got lost driving one time, this has not recurred. Her neurological exam is normal, MOCA score today again 19/30, they deny any difficulties with complex ADLs. She is taking Aricept 10mg  daily. She will proceed with MRI brain as previously discussed to assess for underlying structural abnormality and assess vascular load. Continue to monitor driving. We again discussed the importance of control of vascular risk factors, physical exercise, and brain stimulation exercises for brain health. She will follow-up in 6 months and knows to call for any changes.   Thank you for allowing me to participate in her care.  Please do not hesitate to call for any questions or concerns.  The duration of this appointment visit was 25 minutes of face-to-face time with the patient.  Greater than 50% of this time was spent in counseling, explanation of diagnosis, planning of further management, and coordination of care.   Patrcia DollyKaren Meia Emley, M.D.   CC: Dr. Jeannetta NapElkins

## 2017-03-28 ENCOUNTER — Encounter: Payer: Self-pay | Admitting: Neurology

## 2017-04-05 ENCOUNTER — Inpatient Hospital Stay: Admission: RE | Admit: 2017-04-05 | Payer: PRIVATE HEALTH INSURANCE | Source: Ambulatory Visit

## 2017-06-19 DIAGNOSIS — R5383 Other fatigue: Secondary | ICD-10-CM | POA: Diagnosis not present

## 2017-06-19 DIAGNOSIS — F329 Major depressive disorder, single episode, unspecified: Secondary | ICD-10-CM | POA: Diagnosis not present

## 2017-06-19 DIAGNOSIS — R531 Weakness: Secondary | ICD-10-CM | POA: Diagnosis not present

## 2017-07-10 DIAGNOSIS — H0100A Unspecified blepharitis right eye, upper and lower eyelids: Secondary | ICD-10-CM | POA: Diagnosis not present

## 2017-07-10 DIAGNOSIS — H1859 Other hereditary corneal dystrophies: Secondary | ICD-10-CM | POA: Diagnosis not present

## 2017-07-10 DIAGNOSIS — H04123 Dry eye syndrome of bilateral lacrimal glands: Secondary | ICD-10-CM | POA: Diagnosis not present

## 2017-07-10 DIAGNOSIS — H0100B Unspecified blepharitis left eye, upper and lower eyelids: Secondary | ICD-10-CM | POA: Diagnosis not present

## 2017-08-12 DIAGNOSIS — F4320 Adjustment disorder, unspecified: Secondary | ICD-10-CM | POA: Diagnosis not present

## 2017-09-09 DIAGNOSIS — F4320 Adjustment disorder, unspecified: Secondary | ICD-10-CM | POA: Diagnosis not present

## 2017-09-09 DIAGNOSIS — M25569 Pain in unspecified knee: Secondary | ICD-10-CM | POA: Diagnosis not present

## 2017-09-23 ENCOUNTER — Ambulatory Visit: Payer: PRIVATE HEALTH INSURANCE | Admitting: Neurology

## 2017-09-25 ENCOUNTER — Other Ambulatory Visit: Payer: Self-pay

## 2017-09-25 ENCOUNTER — Encounter: Payer: Self-pay | Admitting: Neurology

## 2017-09-25 ENCOUNTER — Ambulatory Visit (INDEPENDENT_AMBULATORY_CARE_PROVIDER_SITE_OTHER): Payer: Medicare Other | Admitting: Neurology

## 2017-09-25 VITALS — BP 122/78 | HR 87 | Ht 65.0 in | Wt 161.0 lb

## 2017-09-25 DIAGNOSIS — G3184 Mild cognitive impairment, so stated: Secondary | ICD-10-CM

## 2017-09-25 MED ORDER — DONEPEZIL HCL 10 MG PO TABS: ORAL_TABLET | ORAL | 3 refills | Status: DC

## 2017-09-25 NOTE — Patient Instructions (Signed)
1. Continue Donepezil 10mg  every night 2. Follow-up in 6 months, call for any changes  FALL PRECAUTIONS: Be cautious when walking. Scan the area for obstacles that may increase the risk of trips and falls. When getting up in the mornings, sit up at the edge of the bed for a few minutes before getting out of bed. Consider elevating the bed at the head end to avoid drop of blood pressure when getting up. Walk always in a well-lit room (use night lights in the walls). Avoid area rugs or power cords from appliances in the middle of the walkways. Use a walker or a cane if necessary and consider physical therapy for balance exercise. Get your eyesight checked regularly.  HOME SAFETY: Consider the safety of the kitchen when operating appliances like stoves, microwave oven, and blender. Consider having supervision and share cooking responsibilities until no longer able to participate in those. Accidents with firearms and other hazards in the house should be identified and addressed as well.  DRIVING: Regarding driving, in patients with progressive memory problems, driving will be impaired. We advise to have someone else do the driving if trouble finding directions or if minor accidents are reported. Independent driving assessment is available to determine safety of driving.  ABILITY TO BE LEFT ALONE: If patient is unable to contact 911 operator, consider using LifeLine, or when the need is there, arrange for someone to stay with patients. Smoking is a fire hazard, consider supervision or cessation. Risk of wandering should be assessed by caregiver and if detected at any point, supervision and safe proof recommendations should be instituted.  MEDICATION SUPERVISION: Inability to self-administer medication needs to be constantly addressed. Implement a mechanism to ensure safe administration of the medications.  RECOMMENDATIONS FOR ALL PATIENTS WITH MEMORY PROBLEMS: 1. Continue to exercise (Recommend 30 minutes  of walking everyday, or 3 hours every week) 2. Increase social interactions - continue going to [place] and enjoy social gatherings with friends and family 3. Eat healthy, avoid fried foods and eat more fruits and vegetables 4. Maintain adequate blood pressure, blood sugar, and blood cholesterol level. Reducing the risk of stroke and cardiovascular disease also helps promoting better memory. 5. Avoid stressful situations. Live a simple life and avoid aggravations. Organize your time and prepare for the next day in anticipation. 6. Sleep well, avoid any interruptions of sleep and avoid any distractions in the bedroom that may interfere with adequate sleep quality 7. Avoid sugar, avoid sweets as there is a strong link between excessive sugar intake, diabetes, and cognitive impairment We discussed the Mediterranean diet, which has been shown to help patients reduce the risk of progressive memory disorders and reduces cardiovascular risk. This includes eating fish, eat fruits and green leafy vegetables, nuts like almonds and hazelnuts, walnuts, and also use olive oil. Avoid fast foods and fried foods as much as possible. Avoid sweets and sugar as sugar use has been linked to worsening of memory function.  There is always a concern of gradual progression of memory problems. If this is the case, then we may need to adjust level of care according to patient needs. Support, both to the patient and caregiver, should then be put into place.

## 2017-09-25 NOTE — Progress Notes (Signed)
NEUROLOGY FOLLOW UP OFFICE NOTE  [name] [id number] [birth year]  HISTORY OF PRESENT ILLNESS: I had the pleasure of seeing Rita Torres in follow-up in the neurology clinic on 09/25/2017.  The patient was last seen on 6 months ago for mild cognitive impairment and is again accompanied by her daughter-in-law who helps supplement the history today. Since her last visit, she feels she is doing better, family reports memory is unchanged, no significant concerns. No further driving concerns. She mostly drives to the store and church. She continues to live alone and manages bills and medications independently, family has not found cause for concern. She is able to dress and bathe independently. No personality changes, paranoia, or hallucinations. No side effects on Donepezil 10mg  daily. She is also on Wellbutrin. She has left leg pain and will be seeing Ortho. She denies any headaches, dizziness, diplopia, dysarthria/dysphagia, neck pain, focal numbness/tingling, bowel/bladder dysfunction.  HPI 09/12/2016: This is a pleasant 84 yo RH woman with no significant past medical history with worsening memory. Her family started noticing memory changes around a year ago where she was noted to have a lot of word-finding difficulties. She was started on Aricept at that time, which she only took intermittently because she felt she was not getting anywhere with it. When she stopped it, she feels there was a difference with worsening of memory. She lives alone and denies any missed bill payments, no difficulties with ADLs, no hygiene concerns from family. Her family became concerned when she got lost driving around 2 months ago going to their house. Her son denies that she repeats herself excessively, no personality changes or hallucinations.No family history of dementia, no history of significant head injuries or alcohol use.   PAST MEDICAL HISTORY: Past Medical History:  Diagnosis Date  . Memory loss   .  Osteopenia 04/2015   t score -1.8    MEDICATIONS: Current Outpatient Medications on File Prior to Visit  Medication Sig Dispense Refill  . Ascorbic Acid (VITAMIN C) 100 MG tablet Take 100 mg by mouth daily.    . Cholecalciferol (VITAMIN D PO) Take by mouth.    . Donepezil HCl (ARICEPT PO) Take by mouth.    . Glucosamine-Chondroitin (OSTEO BI-FLEX REGULAR STRENGTH PO) Take by mouth.     No current facility-administered medications on file prior to visit.     ALLERGIES: Allergies  Allergen Reactions  . Penicillins Rash    FAMILY HISTORY: Family History  Problem Relation Age of Onset  . Heart disease Father     SOCIAL HISTORY: Social History   Socioeconomic History  . Marital status: Widowed    Spouse name: Not on file  . Number of children: Not on file  . Years of education: Not on file  . Highest education level: Not on file  Occupational History  . Not on file  Social Needs  . Financial resource strain: Not on file  . Food insecurity:    Worry: Not on file    Inability: Not on file  . Transportation needs:    Medical: Not on file    Non-medical: Not on file  Tobacco Use  . Smoking status: Never Smoker  . Smokeless tobacco: Never Used  Substance and Sexual Activity  . Alcohol use: No    Alcohol/week: 0.0 oz  . Drug use: No  . Sexual activity: Never    Birth control/protection: Surgical    Comment: HYST-1st intercourse 3 yo-1 partner  Lifestyle  . Physical activity:  Days per week: Not on file    Minutes per session: Not on file  . Stress: Not on file  Relationships  . Social connections:    Talks on phone: Not on file    Gets together: Not on file    Attends religious service: Not on file    Active member of club or organization: Not on file    Attends meetings of clubs or organizations: Not on file    Relationship status: Not on file  . Intimate partner violence:    Fear of current or ex partner: Not on file    Emotionally abused: Not on file      Physically abused: Not on file    Forced sexual activity: Not on file  Other Topics Concern  . Not on file  Social History Narrative  . Not on file    REVIEW OF SYSTEMS: Constitutional: No fevers, chills, or sweats, no generalized fatigue, change in appetite Eyes: No visual changes, double vision, eye pain Ear, nose and throat: No hearing loss, ear pain, nasal congestion, sore throat Cardiovascular: No chest pain, palpitations Respiratory:  No shortness of breath at rest or with exertion, wheezes GastrointestinaI: No nausea, vomiting, diarrhea, abdominal pain, fecal incontinence Genitourinary:  No dysuria, urinary retention or frequency Musculoskeletal:  No neck pain, back pain Integumentary: No rash, pruritus, skin lesions Neurological: as above Psychiatric: No depression, insomnia, anxiety Endocrine: No palpitations, fatigue, diaphoresis, mood swings, change in appetite, change in weight, increased thirst Hematologic/Lymphatic:  No anemia, purpura, petechiae. Allergic/Immunologic: no itchy/runny eyes, nasal congestion, recent allergic reactions, rashes  PHYSICAL EXAM: Vitals:   09/25/17 1140  BP: 122/78  Pulse: 87  SpO2: 99%   General: No acute distress Head:  Normocephalic/atraumatic Neck: supple, no paraspinal tenderness, full range of motion Heart:  Regular rate and rhythm Lungs:  Clear to auscultation bilaterally Back: No paraspinal tenderness Skin/Extremities: No rash, no edema Neurological Exam: alert and oriented to person, place, time (except day of week). No aphasia or dysarthria. Fund of knowledge is appropriate.  Remote memory intact.  Attention and concentration are normal.    Able to name objects and repeat phrases. CDT 4/5  MMSE - Mini Mental State Exam 09/25/2017  Orientation to time 4  Orientation to Place 5  Registration 3  Attention/ Calculation 4  Recall 2  Language- name 2 objects 2  Language- repeat 1  Language- follow 3 step command 3   Language- read & follow direction 1  Write a sentence 1  Copy design 1  Total score 27    Montreal Cognitive Assessment  03/25/2017 09/12/2016  Visuospatial/ Executive (0/5) 3 1  Naming (0/3) 2 2  Attention: Read list of digits (0/2) 2 2  Attention: Read list of letters (0/1) 1 1  Attention: Serial 7 subtraction starting at 100 (0/3) 3 1  Language: Repeat phrase (0/2) 2 2  Language : Fluency (0/1) 0 1  Abstraction (0/2) 2 2  Delayed Recall (0/5) 0 1  Orientation (0/6) 4 6  Total 19 19   Cranial nerves: Pupils equal, round, reactive to light.  Extraocular movements intact with no nystagmus. Visual fields full. Facial sensation intact. No facial asymmetry. Tongue, uvula, palate midline.  Motor: Bulk and tone normal, muscle strength 5/5 throughout with no pronator drift.  Sensation to light touch intact.  No extinction to double simultaneous stimulation.  Deep tendon reflexes 2+ throughout, toes downgoing.  Finger to nose testing intact.  Gait slow and cautious favoring left leg  due to pain.  IMPRESSION: This is a pleasant 84 yo RH woman with no significant past medical history, with mild cognitive impairment. No further difficulties with complex tasks. Her family got concerned after she got lost driving one time, this has not recurred. Neurological exam non-focal, MMSE today normal 27/30. Continue Aricept 10mg  daily. Continue to monitor driving. Family was instructed to check behind her with medications and bills. We again discussed the importance of control of vascular risk factors, physical exercise, and brain stimulation exercises for brain health. She will follow-up in 6 months and knows to call for any changes.   Thank you for allowing me to participate in her care.  Please do not hesitate to call for any questions or concerns.  The duration of this appointment visit was 30 minutes of face-to-face time with the patient.  Greater than 50% of this time was spent in counseling, explanation  of diagnosis, planning of further management, and coordination of care.   Patrcia DollyKaren Nalina Yeatman, M.D.   CC: Dr. Tenny Crawoss

## 2017-10-04 DIAGNOSIS — M25561 Pain in right knee: Secondary | ICD-10-CM | POA: Diagnosis not present

## 2017-10-04 DIAGNOSIS — M25562 Pain in left knee: Secondary | ICD-10-CM | POA: Diagnosis not present

## 2017-11-04 DIAGNOSIS — H532 Diplopia: Secondary | ICD-10-CM | POA: Diagnosis not present

## 2017-12-26 ENCOUNTER — Ambulatory Visit (INDEPENDENT_AMBULATORY_CARE_PROVIDER_SITE_OTHER): Payer: Medicare Other | Admitting: Gynecology

## 2017-12-26 ENCOUNTER — Encounter: Payer: Self-pay | Admitting: Gynecology

## 2017-12-26 VITALS — BP 124/80

## 2017-12-26 DIAGNOSIS — Z23 Encounter for immunization: Secondary | ICD-10-CM | POA: Diagnosis not present

## 2017-12-26 DIAGNOSIS — R61 Generalized hyperhidrosis: Secondary | ICD-10-CM | POA: Diagnosis not present

## 2017-12-26 LAB — CBC WITH DIFFERENTIAL/PLATELET
Basophils Absolute: 32 cells/uL (ref 0–200)
Basophils Relative: 0.5 %
Eosinophils Absolute: 51 cells/uL (ref 15–500)
Eosinophils Relative: 0.8 %
HCT: 38.1 % (ref 35.0–45.0)
Hemoglobin: 12.5 g/dL (ref 11.7–15.5)
Lymphs Abs: 1043 cells/uL (ref 850–3900)
MCH: 30.4 pg (ref 27.0–33.0)
MCHC: 32.8 g/dL (ref 32.0–36.0)
MCV: 92.7 fL (ref 80.0–100.0)
MPV: 12.4 fL (ref 7.5–12.5)
Monocytes Relative: 5.8 %
Neutro Abs: 4902 cells/uL (ref 1500–7800)
Neutrophils Relative %: 76.6 %
Platelets: 143 10*3/uL (ref 140–400)
RBC: 4.11 10*6/uL (ref 3.80–5.10)
RDW: 12.8 % (ref 11.0–15.0)
Total Lymphocyte: 16.3 %
WBC mixed population: 371 cells/uL (ref 200–950)
WBC: 6.4 10*3/uL (ref 3.8–10.8)

## 2017-12-26 LAB — COMPREHENSIVE METABOLIC PANEL
AG Ratio: 1.7 (calc) (ref 1.0–2.5)
ALT: 15 U/L (ref 6–29)
AST: 15 U/L (ref 10–35)
Albumin: 4 g/dL (ref 3.6–5.1)
Alkaline phosphatase (APISO): 64 U/L (ref 33–130)
BUN/Creatinine Ratio: 15 (calc) (ref 6–22)
BUN: 14 mg/dL (ref 7–25)
CO2: 28 mmol/L (ref 20–32)
Calcium: 9.2 mg/dL (ref 8.6–10.4)
Chloride: 105 mmol/L (ref 98–110)
Creat: 0.91 mg/dL — ABNORMAL HIGH (ref 0.60–0.88)
Globulin: 2.3 g/dL (calc) (ref 1.9–3.7)
Glucose, Bld: 99 mg/dL (ref 65–99)
Potassium: 4.4 mmol/L (ref 3.5–5.3)
Sodium: 141 mmol/L (ref 135–146)
Total Bilirubin: 0.5 mg/dL (ref 0.2–1.2)
Total Protein: 6.3 g/dL (ref 6.1–8.1)

## 2017-12-26 LAB — TSH: TSH: 2.01 mIU/L (ref 0.40–4.50)

## 2017-12-26 NOTE — Patient Instructions (Signed)
Follow-up with Dr. Tenny Crawoss if your night sweats continue.

## 2017-12-26 NOTE — Progress Notes (Signed)
    Rita Torres Sep 01, 1932 [id number]        84 y.o.  [id number] presents complaining of night sweats over the past year.  Notes that she will wake up with her chest wet from sweating.  Is not having an issue during the day.  No weight changes hair or skin changes.  No nausea vomiting diarrhea constipation.  Actively sees her primary physician.  Past medical history,surgical history, problem list, medications, allergies, family history and social history were all reviewed and documented in the EPIC chart.  Directed ROS with pertinent positives and negatives documented in the history of present illness/assessment and plan.  Exam: Vitals:   12/26/17 1149  BP: 124/80   General appearance:  Normal HEENT normal Lungs clear Cardiac regular rate no rubs murmurs or gallops Abdomen soft nontender without masses guarding rebound  Assessment/Plan:  84 y.o. G2P2002 with night sweats.  I reviewed with her and her daughter-in-law various etiologies for night sweats.  I doubt that they are menopausally related noting she is status post hysterectomy BSO a number of years ago and did not have issues proceeding this.  Recommended baseline CBC, CMP and TSH.  Recommended that she follow-up with her primary physician if her symptoms continue to rule out other possibilities.    Dara Lordsimothy P Reve Crocket MD, 12:09 PM 12/26/2017

## 2018-01-08 DIAGNOSIS — R0609 Other forms of dyspnea: Secondary | ICD-10-CM | POA: Diagnosis not present

## 2018-01-08 DIAGNOSIS — F4320 Adjustment disorder, unspecified: Secondary | ICD-10-CM | POA: Diagnosis not present

## 2018-02-04 DIAGNOSIS — F4320 Adjustment disorder, unspecified: Secondary | ICD-10-CM | POA: Diagnosis not present

## 2018-03-10 NOTE — Progress Notes (Deleted)
Cardiology Office Note   Date:  03/10/2018   ID:  Rita Torres, DOB 1932/08/28, MRN [id number]  PCP:  Daisy Florooss, Charles Alan, MD  Cardiologist:   Charlton HawsPeter , MD   No chief complaint on file.     History of Present Illness: Rita Torres is a 84 y.o. female who presents for consultation regarding progressive dyspnea. Referred by Dr Tenny Crawoss Reviewed his office note from 01/08/18 She complained of feeling hot all the time with night sweats and fatigue Had Been on antidepressant and stopped as it made her more anxious. She has no history of cardiac disease. Deineis Recent infection , dysuria, cough sputum Non smoker Has had no recent CXR, labs or TTE  ***   Past Medical History:  Diagnosis Date  . Dementia (HCC)   . Dyspnea on exertion   . Grief reaction   . Memory loss   . Osteopenia 04/2015   t score -1.8    Past Surgical History:  Procedure Laterality Date  . ABDOMINAL HYSTERECTOMY     BSO  . OOPHORECTOMY     BSO     Current Outpatient Medications  Medication Sig Dispense Refill  . Ascorbic Acid (VITAMIN C) 100 MG tablet Take 100 mg by mouth daily.    . Cholecalciferol (VITAMIN D PO) Take by mouth.    . donepezil (ARICEPT) 10 MG tablet Take 1 tablet every night 90 tablet 3  . Glucosamine-Chondroitin (OSTEO BI-FLEX REGULAR STRENGTH PO) Take by mouth.     No current facility-administered medications for this visit.     Allergies:   Penicillins    Social History:  The patient  reports that she has never smoked. She has never used smokeless tobacco. She reports that she does not drink alcohol or use drugs.   Family History:  The patient's family history includes Heart disease in her father.    ROS:  Please see the history of present illness.   Otherwise, review of systems are positive for none.   All other systems are reviewed and negative.    PHYSICAL EXAM: VS:  There were no vitals taken for this visit. , BMI There is no height or weight on file to  calculate BMI. Affect appropriate Healthy:  appears stated age HEENT: normal Neck supple with no adenopathy JVP normal no bruits no thyromegaly Lungs clear with no wheezing and good diaphragmatic motion Heart:  S1/S2 no murmur, no rub, gallop or click PMI normal Abdomen: benighn, BS positve, no tenderness, no AAA no bruit.  No HSM or HJR Distal pulses intact with no bruits No edema Neuro non-focal Skin warm and dry No muscular weakness    EKG:  ***   Recent Labs: 12/26/2017: ALT 15; BUN 14; Creat 0.91; Hemoglobin 12.5; Platelets 143; Potassium 4.4; Sodium 141; TSH 2.01    Lipid Panel No results found for: CHOL, TRIG, HDL, CHOLHDL, VLDL, LDLCALC, LDLDIRECT    Wt Readings from Last 3 Encounters:  09/25/17 161 lb (73 kg)  03/25/17 164 lb (74.4 kg)  09/12/16 166 lb (75.3 kg)      Other studies Reviewed: Additional studies/ records that were reviewed today include: Notes Dr Tenny Crawoss 01/08/18 .    ASSESSMENT AND PLAN:  1.  ***   Current medicines are reviewed at length with the patient today.  The patient {ACTIONS; HAS/DOES NOT HAVE:19233} concerns regarding medicines.  The following changes have been made:  {PLAN; NO CHANGE:13088:s}  Labs/ tests ordered today include: *** No orders of  the defined types were placed in this encounter.    Disposition:   FU with ***     Signed, Charlton Haws, MD  03/10/2018 3:04 PM    [facility] [address] [place], [place], [place]  [address] Phone: [phone]; Fax: [phone]

## 2018-03-11 ENCOUNTER — Ambulatory Visit: Payer: Medicare Other | Admitting: Cardiovascular Disease

## 2018-03-19 DIAGNOSIS — H532 Diplopia: Secondary | ICD-10-CM | POA: Diagnosis not present

## 2018-03-19 DIAGNOSIS — H04123 Dry eye syndrome of bilateral lacrimal glands: Secondary | ICD-10-CM | POA: Diagnosis not present

## 2018-03-27 ENCOUNTER — Telehealth: Payer: Self-pay | Admitting: Neurology

## 2018-03-27 NOTE — Telephone Encounter (Signed)
Patient's daughter in law is calling due to her getting a little worse. She said she is unable to read things or tell the time. She is not sure if she should be seen before her 05/09/18 appt? She is on the wait list. Please Advise. Thank you

## 2018-04-08 ENCOUNTER — Telehealth: Payer: Self-pay | Admitting: *Deleted

## 2018-04-08 NOTE — Telephone Encounter (Addendum)
REFERRAL SENT TO SCHEDULING FROM DR. ALAN ROSS AND NOTES ON FILE..Marland Kitchen

## 2018-04-14 NOTE — Progress Notes (Deleted)
Cardiology Office Note   Date:  04/14/2018   ID:  Rita, Torres Sep 13, 1932, MRN [id number]  PCP:  Daisy Floro, MD  Cardiologist:   Charlton Haws, MD   No chief complaint on file.     History of Present Illness: Rita Torres is a 85 y.o. female who presents for consultation regarding progressive dyspnea Referred  By Dr Tenny Craw.  She has multiple somatic complaints including night sweats. Fatigue and exertional dyspnea  Has not felt well for 6 months Eating but losing weight  Was on antidepressant but made her feel nervous  And she stopped it. No history of CAD, CHF, Arrhythmia Non smoker no history of lung disease DVT or PE Labs ok reviewed Hct 38 TSH normal normal BUN/CR  ***  Past Medical History:  Diagnosis Date  . Dementia (HCC)   . Dyspnea on exertion   . Grief reaction   . Memory loss   . Osteopenia 04/2015   t score -1.8    Past Surgical History:  Procedure Laterality Date  . ABDOMINAL HYSTERECTOMY     BSO  . OOPHORECTOMY     BSO     Current Outpatient Medications  Medication Sig Dispense Refill  . Ascorbic Acid (VITAMIN C) 100 MG tablet Take 100 mg by mouth daily.    . Cholecalciferol (VITAMIN D PO) Take by mouth.    . donepezil (ARICEPT) 10 MG tablet Take 1 tablet every night 90 tablet 3  . Glucosamine-Chondroitin (OSTEO BI-FLEX REGULAR STRENGTH PO) Take by mouth.     No current facility-administered medications for this visit.     Allergies:   Penicillins    Social History:  The patient  reports that she has never smoked. She has never used smokeless tobacco. She reports that she does not drink alcohol or use drugs.   Family History:  The patient's family history includes Heart disease in her father.    ROS:  Please see the history of present illness.   Otherwise, review of systems are positive for none.   All other systems are reviewed and negative.    PHYSICAL EXAM: VS:  There were no vitals taken for this visit. , BMI There  is no height or weight on file to calculate BMI. Affect appropriate Healthy:  appears stated age HEENT: normal Neck supple with no adenopathy JVP normal no bruits no thyromegaly Lungs clear with no wheezing and good diaphragmatic motion Heart:  S1/S2 no murmur, no rub, gallop or click PMI normal Abdomen: benighn, BS positve, no tenderness, no AAA no bruit.  No HSM or HJR Distal pulses intact with no bruits No edema Neuro non-focal Skin warm and dry No muscular weakness    EKG:  ***   Recent Labs: 12/26/2017: ALT 15; BUN 14; Creat 0.91; Hemoglobin 12.5; Platelets 143; Potassium 4.4; Sodium 141; TSH 2.01    Lipid Panel No results found for: CHOL, TRIG, HDL, CHOLHDL, VLDL, LDLCALC, LDLDIRECT    Wt Readings from Last 3 Encounters:  09/25/17 161 lb (73 kg)  03/25/17 164 lb (74.4 kg)  09/12/16 166 lb (75.3 kg)      Other studies Reviewed: Additional studies/ records that were reviewed today include: Notes from primary labs .    ASSESSMENT AND PLAN:  1.  Dyspnea:  Normal exam no evidence for cardiac issues F/U TTE to assess RV/LV function has not Had recent CXR will also order 2. Depression/Dementia:  Did not tolerate Celexa f/u primary Continue Aricept  3.    Current medicines are reviewed at length with the patient today.  The patient does not have concerns regarding medicines.  The following changes have been made:  no change  Labs/ tests ordered today include: TTE  No orders of the defined types were placed in this encounter.    Disposition:   FU with cardiology PRN      Signed, Charlton HawsPeter Dail Meece, MD  04/14/2018 11:54 AM    [facility] [address], [place], [place]  [address] Phone: [phone]; Fax: [phone]

## 2018-04-15 ENCOUNTER — Ambulatory Visit: Payer: Medicare Other | Admitting: Cardiovascular Disease

## 2018-05-08 ENCOUNTER — Other Ambulatory Visit: Payer: Self-pay

## 2018-05-09 ENCOUNTER — Ambulatory Visit: Payer: PRIVATE HEALTH INSURANCE | Admitting: Neurology

## 2018-06-17 ENCOUNTER — Ambulatory Visit: Payer: Medicare Other

## 2018-06-17 VITALS — BP 122/84 | HR 66

## 2018-06-17 DIAGNOSIS — G3184 Mild cognitive impairment, so stated: Secondary | ICD-10-CM

## 2018-06-17 NOTE — Progress Notes (Signed)
Patient comes to the office today with a history of Mild Cognitive Impairment  Any patient concerns? no Patient is accompanied by her daughter-in-law, Misty Stanley.   Functional Questionnaire:  Independent - I Dependent - D    Activities of Daily Living (ADLs):     Personal hygiene - I Dressing - I Eating - I Maintaining continence - I Transferring - I   Independent Activities of Daily Living (iADLs): Basic communication skills - I Transportation -  Pt drives to familiar places (family, grocery store, etc.) but has family drive her to other places.  Today she drove to her son's home, and her daughter-in-law drive to the office Meal preparation - Pt makes herself microwave ready meals, family brings home cooked meals to pt multiple times per week Shopping - I Housework - I Managing medications - I Managing personal finances -  Pt is now having her daughter-in-law help her with finances after a few missed bills.  Daughter-in-law writes the checks for bills, pt signs.    Pt still resides alone.  Is getting the telephone and TV remote control confused.  Pt has recently stopped her long-time bowling league and has stopped going to church.  Pt has been a member of her church for 50+ years.  No reason given for stopping these activities.   We are planning to proceed with memory testing today.  Patient will undergo Montreal Cognitive Assessment (MoCA) by our psychometrician.    Results will be given to our provider for assessment and we will relay any treatment plans back to the patient. Confirmed with patient if condition begins to worsen call PCP or go to the ER.  Patient was given the office number and encouraged to call back with questions or concerns.

## 2018-06-19 ENCOUNTER — Telehealth: Payer: Self-pay

## 2018-06-19 NOTE — Telephone Encounter (Signed)
spoke with pt's daughter-in-law, Misty Stanley, advising that we need proof of POA before Dr. Karel Jarvis can write letter of competency.  Misty Stanley states that she will be by the office today to drop those papers off.    Misty Stanley also asks about anti-depressant.  Advised that I would speak with Dr. Karel Jarvis and return call with her decision.  Verified preferred pharmacy as [facility].

## 2018-06-19 NOTE — Telephone Encounter (Signed)
Pt's daughter-in-law had POA forms faxed to our office.   Called Misty Stanley letting her know that we have received the paperwork.  Advised that Dr. Karel Jarvis will most likely prescribe something tomorrow, as she wants to take a closer look in pt's chart to make sure that whatever she prescribes does not interfere with any of pt's medical conditions (per Verbal conversation with Dr. Karel Jarvis) Advised that I would call her tomorrow to let her know details of medication

## 2018-06-23 ENCOUNTER — Telehealth: Payer: Self-pay | Admitting: Neurology

## 2018-06-23 NOTE — Telephone Encounter (Signed)
Rita Torres called regarding Trenda starting an Anti Depressant. Please Call. Thanks

## 2018-06-23 NOTE — Telephone Encounter (Signed)
Pls let her know we can start citalopram 10mg  every night. Side effects in some people include nausea and dizziness. These medications take 3-4 weeks for full effect. Thanks

## 2018-06-24 ENCOUNTER — Other Ambulatory Visit: Payer: Self-pay

## 2018-06-24 MED ORDER — CITALOPRAM HYDROBROMIDE 10 MG PO TABS: 10.0000 mg | ORAL_TABLET | Freq: Every day | ORAL | 3 refills | Status: DC

## 2018-06-24 NOTE — Telephone Encounter (Signed)
Spoke with Misty Stanley advising that Rx for Celexa has been sent to [facility].  Instructions given.  Advised that it may take 3-4 weeks before medication is at fully efficacy.

## 2018-09-15 ENCOUNTER — Emergency Department (HOSPITAL_COMMUNITY): Payer: Medicare Other

## 2018-09-15 ENCOUNTER — Other Ambulatory Visit: Payer: Self-pay

## 2018-09-15 ENCOUNTER — Encounter (HOSPITAL_COMMUNITY): Payer: Self-pay | Admitting: Emergency Medicine

## 2018-09-15 ENCOUNTER — Emergency Department (HOSPITAL_COMMUNITY)
Admission: EM | Admit: 2018-09-15 | Discharge: 2018-09-15 | Disposition: A | Discharge status: Home / Self Care | Payer: Medicare Other | Attending: Emergency Medicine | Admitting: Emergency Medicine

## 2018-09-15 DIAGNOSIS — W010XXA Fall on same level from slipping, tripping and stumbling without subsequent striking against object, initial encounter: Secondary | ICD-10-CM | POA: Diagnosis not present

## 2018-09-15 DIAGNOSIS — R609 Edema, unspecified: Secondary | ICD-10-CM | POA: Diagnosis not present

## 2018-09-15 DIAGNOSIS — M7918 Myalgia, other site: Secondary | ICD-10-CM | POA: Insufficient documentation

## 2018-09-15 DIAGNOSIS — M25551 Pain in right hip: Secondary | ICD-10-CM | POA: Diagnosis not present

## 2018-09-15 DIAGNOSIS — S79921A Unspecified injury of right thigh, initial encounter: Secondary | ICD-10-CM | POA: Diagnosis not present

## 2018-09-15 DIAGNOSIS — Y999 Unspecified external cause status: Secondary | ICD-10-CM | POA: Insufficient documentation

## 2018-09-15 DIAGNOSIS — F0390 Unspecified dementia without behavioral disturbance: Secondary | ICD-10-CM | POA: Insufficient documentation

## 2018-09-15 DIAGNOSIS — M25561 Pain in right knee: Secondary | ICD-10-CM | POA: Diagnosis not present

## 2018-09-15 DIAGNOSIS — R0902 Hypoxemia: Secondary | ICD-10-CM | POA: Diagnosis not present

## 2018-09-15 DIAGNOSIS — Z79899 Other long term (current) drug therapy: Secondary | ICD-10-CM | POA: Diagnosis not present

## 2018-09-15 DIAGNOSIS — R52 Pain, unspecified: Secondary | ICD-10-CM | POA: Diagnosis not present

## 2018-09-15 DIAGNOSIS — W19XXXA Unspecified fall, initial encounter: Secondary | ICD-10-CM | POA: Diagnosis not present

## 2018-09-15 DIAGNOSIS — M5136 Other intervertebral disc degeneration, lumbar region: Secondary | ICD-10-CM | POA: Diagnosis not present

## 2018-09-15 DIAGNOSIS — Y939 Activity, unspecified: Secondary | ICD-10-CM | POA: Diagnosis not present

## 2018-09-15 MED ORDER — OXYCODONE-ACETAMINOPHEN 5-325 MG PO TABS
1.0000 | ORAL_TABLET | Freq: Once | ORAL | Status: AC
  Administered 2018-09-15: 1 via ORAL
  Filled 2018-09-15: qty 1

## 2018-09-15 NOTE — ED Provider Notes (Signed)
[facility]-EMERGENCY DEPT Provider Note   CSN: [id number] Arrival date & time: 6/8/[id number]  1837  History   Chief Complaint Chief Complaint  Patient presents with  . Fall    HPI Rita Torres is a 85 y.o. female with a history of osteopenia and dementia who presents to the emergency department via EMS status post mechanical fall shortly prior to arrival with complaints of right hip, upper leg, and knee pain.  Patient states that she was ambulating throughout her home and tripped over a rug resulting in a fall.  Denies head injury or loss of consciousness.  Denies prodromal symptoms prior to the fall. states she is having pain to the right lower extremity as described above.  States she was unable to get up without assistance.  She typically does not walk with a walker or cane.  Denies any other areas of injury, denies numbness, tingling, weakness, chest pain, abdominal pain, or shortness of breath.     HPI  Past Medical History:  Diagnosis Date  . Dementia (HCC)   . Dyspnea on exertion   . Grief reaction   . Memory loss   . Osteopenia 04/2015   t score -1.8    Patient Active Problem List   Diagnosis Date Noted  . Mild cognitive impairment 09/14/2016  . OAB (overactive bladder) 11/16/2013  . Osteopenia     Past Surgical History:  Procedure Laterality Date  . ABDOMINAL HYSTERECTOMY     BSO  . OOPHORECTOMY     BSO     OB History    Gravida  2   Para  2   Term  2   Preterm      AB      Living  2     SAB      TAB      Ectopic      Multiple      Live Births               Home Medications    Prior to Admission medications   Medication Sig Start Date End Date Taking? Authorizing Provider  Ascorbic Acid (VITAMIN C) 100 MG tablet Take 100 mg by mouth daily.    [provider]  Cholecalciferol (VITAMIN D PO) Take by mouth.    [provider]  citalopram (CELEXA) 10 MG tablet Take 1 tablet (10 mg total) by mouth  daily. 06/24/18   Van ClinesAquino, Karen M, MD  donepezil (ARICEPT) 10 MG tablet Take 1 tablet every night 09/25/17   Van ClinesAquino, Karen M, MD  Glucosamine-Chondroitin (OSTEO BI-FLEX REGULAR STRENGTH PO) Take by mouth.    [provider]    Family History Family History  Problem Relation Age of Onset  . Heart disease Father     Social History Social History   Tobacco Use  . Smoking status: Never Smoker  . Smokeless tobacco: Never Used  Substance Use Topics  . Alcohol use: No    Alcohol/week: 0.0 standard drinks  . Drug use: No     Allergies   Penicillins   Review of Systems Review of Systems  Constitutional: Negative for chills and fever.  Eyes: Negative for visual disturbance.  Respiratory: Negative for shortness of breath.   Cardiovascular: Negative for chest pain.  Gastrointestinal: Negative for abdominal pain.  Musculoskeletal: Positive for arthralgias and myalgias.  Neurological: Negative for dizziness, seizures, syncope, speech difficulty, weakness, light-headedness, numbness and headaches.  All other systems reviewed and are negative.  Physical Exam Updated Vital Signs BP (!) 131/59   Pulse 70   Temp 98.8 F (37.1 C) (Oral)   Resp 18   SpO2 98%   Physical Exam Vitals signs and nursing note reviewed.  Constitutional:      General: She is not in acute distress.    Appearance: She is well-developed. She is not toxic-appearing.  HENT:     Head: Normocephalic and atraumatic.     Comments: No raccoon eyes or battle sign. Eyes:     General:        Right eye: No discharge.        Left eye: No discharge.     Extraocular Movements: Extraocular movements intact.     Conjunctiva/sclera: Conjunctivae normal.     Pupils: Pupils are equal, round, and reactive to light.  Neck:     Musculoskeletal: Normal range of motion and neck supple.     Comments: No midline cervical spine tenderness. Cardiovascular:     Rate and Rhythm: Normal rate and regular rhythm.      Comments: 2+ symmetric DP/PT pulses. Pulmonary:     Effort: Pulmonary effort is normal. No respiratory distress.     Breath sounds: Normal breath sounds. No wheezing, rhonchi or rales.  Abdominal:     General: There is no distension.     Palpations: Abdomen is soft.     Tenderness: There is no abdominal tenderness. There is no guarding or rebound.  Musculoskeletal:     Comments: Upper extremities: Normal active range of motion without point/focal bony tenderness Back: Patient has diffuse lumbar spine tenderness to palpation without point/focal vertebral tenderness or palpable step-off Lower extremities: Patient without obvious deformity, open wounds, or ecchymosis.  She is holding her hip and knee in flexed position initially.  She has intact active range of motion to bilateral ankles, the left knee, and the left hip.  She is unable to fully extend the right knee and unable to lift her right lower extremity off of the stretcher.  RLE: She is tender to palpation over the anterior right hip including the ASIS, and over the femur, and tender over the diffuse knee.  Her extremities are otherwise nontender.  Skin:    General: Skin is warm and dry.     Findings: No rash.  Neurological:     General: No focal deficit present.     Mental Status: She is alert.     Comments: Clear speech.  Sensation grossly intact bilateral lower extremities.  5 out of 5 symmetric ankle plantar dorsiflexion bilaterally.  Psychiatric:        Behavior: Behavior normal.    ED Treatments / Results  Labs (all labs ordered are listed, but only abnormal results are displayed) Labs Reviewed - No data to display  EKG None  Radiology Dg Lumbar Spine Complete  Result Date: 09/15/2018 CLINICAL DATA:  Right upper leg pain after fall today. EXAM: LUMBAR SPINE - COMPLETE 4+ VIEW COMPARISON:  None. FINDINGS: No fracture or spondylolisthesis is noted. Severe degenerative disc disease is noted at L1-2, L4-5 and L5-S1. Posterior  facet joints are unremarkable. IMPRESSION: Severe multilevel degenerative disc disease. No acute abnormality seen in the lumbar spine. Electronically Signed   By: Lupita RaiderJames  Green Jr M.D.   On: 09/15/2018 20:43   Dg Knee Complete 4 Views Right  Result Date: 09/15/2018 CLINICAL DATA:  Right knee pain after fall today. EXAM: RIGHT KNEE - COMPLETE 4+ VIEW COMPARISON:  None. FINDINGS: No evidence of fracture,  dislocation, or joint effusion. No evidence of arthropathy or other focal bone abnormality. Soft tissues are unremarkable. IMPRESSION: Negative. Electronically Signed   By: Lupita RaiderJames  Green Jr M.D.   On: 09/15/2018 20:46   Dg Hip Unilat With Pelvis 2-3 Views Right  Result Date: 09/15/2018 CLINICAL DATA:  Right hip pain after fall today. EXAM: DG HIP (WITH OR WITHOUT PELVIS) 2-3V RIGHT COMPARISON:  None. FINDINGS: There is no evidence of hip fracture or dislocation. There is no evidence of arthropathy or other focal bone abnormality. IMPRESSION: Negative. Electronically Signed   By: Lupita RaiderJames  Green Jr M.D.   On: 09/15/2018 20:44   Dg Femur Min 2 Views Right  Result Date: 09/15/2018 CLINICAL DATA:  Right leg pain after fall today. EXAM: RIGHT FEMUR 2 VIEWS COMPARISON:  None. FINDINGS: There is no evidence of fracture or other focal bone lesions. Soft tissues are unremarkable. IMPRESSION: Negative. Electronically Signed   By: Lupita RaiderJames  Green Jr M.D.   On: 09/15/2018 20:47    Procedures Procedures (including critical care time)  SPLINT APPLICATION Date/Time: 10:58 PM Authorized by: Harvie HeckSamantha Edona Schreffler Consent: Verbal consent obtained. Risks and benefits: risks, benefits and alternatives were discussed Consent given by: patient Splint applied by: orthopedic technician Location details: RLE Splint type: Knee immobilizer Supplies used: knee immobilizer.  Post-procedure: The splinted body part was neurovascularly unchanged following the procedure. Patient tolerance: Patient tolerated the procedure well with no  immediate complications.  Medications Ordered in ED Medications  oxyCODONE-acetaminophen (PERCOCET/ROXICET) 5-325 MG per tablet 1 tablet (1 tablet Oral Given 09/15/18 2032)    Initial Impression / Assessment and Plan / ED Course  I have reviewed the triage vital signs and the nursing notes.  Pertinent labs & imaging results that were available during my care of the patient were reviewed by me and considered in my medical decision making (see chart for details).   Presents to the emergency department status post mechanical fall with complaints of right hip/upper leg/knee pain.  Nontoxic-appearing, no apparent distress, vitals without significant abnormality.  Her initial SPO2 was documented at 91%, she has been persistently at greater than 95% throughout ER stay, suspect this was an inaccurate reading.  No signs of serious head, neck, or back injury.  No midline spinal tenderness of the cervical or thoracic spine, L-spine x-ray negative without point/focal tenderness or palpable step-off.  No focal neurologic deficits.  No chest or abdominal tenderness to indicate acute anterior thoracic/abdominal injury.  Right lower extremity hip/knee with some mild limitation in range of motion, no obvious deformity, ecchymosis, open wounds, tender to palpation to the hip femur and knee.  X-rays obtained negative for fracture or dislocation.  Patient feeling much better following Percocet in the emergency department.  She has been ambulatory with a knee immobilizer able to bear weight on the right lower extremity.  She feels comfortable with discharge home.  Tylenol as needed for any continued discomfort. I discussed results, treatment plan, need for follow-up, and return precautions with the patient & her son via telephone. Provided opportunity for questions, patient & her son provided confirmed understanding and are in agreement with plan.   Findings and plan of care discussed with supervising physician Dr. Donnald GarrePfeiffer  who is in agreement.   Vitals:   09/15/18 2130 09/15/18 2200  BP: 119/82 (!) 131/59  Pulse: 93 70  Resp: 16 18  Temp:    SpO2: 98% 98%    Final Clinical Impressions(s) / ED Diagnoses   Final diagnoses:  Fall, initial encounter  ED Discharge Orders    None       Leafy Kindle 09/15/18 2300    Charlesetta Shanks, MD 09/16/18 1439

## 2018-09-15 NOTE — Discharge Instructions (Signed)
Please read and follow all provided instructions.  You have been seen today for a fall.  Your x-rays do not show any fractures or dislocations.  We have provided you with a knee immobilizer to help with compression and stability, please wear this for comfort as needed, you may discontinue use if you are feeling better.  Home care instructions: -- *PRICE in the first 24-48 hours after injury: Protect (with brace, splint, sling), if given by your provider Rest Ice- Do not apply ice pack directly to your skin, place towel or similar between your skin and ice/ice pack. Apply ice for 20 min, then remove for 40 min while awake Compression- Wear brace, elastic bandage, splint as directed by your provider Elevate affected extremity above the level of your heart when not walking around for the first 24-48 hours   Medications:  Please take Tylenol per over-the-counter dosing instructions.  Follow-up instructions: Please follow-up with your primary care provider or the provided orthopedic physician (bone specialist) if you continue to have significant pain in 1 week. In this case you may have a more severe injury that requires further care.   Return instructions:  Please return if your digits or extremity are numb or tingling, appear gray or blue, or you have severe pain (also elevate the extremity and loosen splint or wrap if you were given one) Please return if you have redness or fevers.  Please return to the Emergency Department if you experience worsening symptoms.  Please return if you have any other emergent concerns. Additional Information:  Your vital signs today were: BP 119/82    Pulse 93    Temp 98.8 F (37.1 C) (Oral)    Resp 16    SpO2 98%  If your blood pressure (BP) was elevated above 135/85 this visit, please have this repeated by your doctor within one month. ---------------

## 2018-09-15 NOTE — ED Notes (Signed)
ED Provider at bedside. 

## 2018-09-15 NOTE — ED Notes (Signed)
Patient transported to X-ray 

## 2018-09-15 NOTE — ED Notes (Signed)
Patient ambulatory in room with staff assistance.

## 2018-09-15 NOTE — ED Triage Notes (Addendum)
Per EMS, patient from home, reports trip over rug walking, c/o right hip and knee pain. Swelling to right knee. Son present at time of fall. Denies head injury and LOC. Denies taking blood thinners. Hx dementia. A&Ox4. Confused at times. Baseline per son.

## 2018-09-23 DIAGNOSIS — M545 Low back pain: Secondary | ICD-10-CM | POA: Diagnosis not present

## 2018-09-23 DIAGNOSIS — M5136 Other intervertebral disc degeneration, lumbar region: Secondary | ICD-10-CM | POA: Diagnosis not present

## 2018-09-23 DIAGNOSIS — M25561 Pain in right knee: Secondary | ICD-10-CM | POA: Diagnosis not present

## 2018-10-02 ENCOUNTER — Other Ambulatory Visit: Payer: Self-pay

## 2018-10-02 ENCOUNTER — Telehealth: Payer: Self-pay | Admitting: Neurology

## 2018-10-02 ENCOUNTER — Ambulatory Visit (HOSPITAL_COMMUNITY)
Admission: RE | Admit: 2018-10-02 | Discharge: 2018-10-02 | Disposition: A | Discharge status: Home / Self Care | Payer: Medicare Other | Source: Ambulatory Visit | Attending: Neurology | Admitting: Neurology

## 2018-10-02 DIAGNOSIS — R4182 Altered mental status, unspecified: Secondary | ICD-10-CM | POA: Diagnosis not present

## 2018-10-02 DIAGNOSIS — W19XXXD Unspecified fall, subsequent encounter: Secondary | ICD-10-CM | POA: Diagnosis not present

## 2018-10-02 DIAGNOSIS — I6782 Cerebral ischemia: Secondary | ICD-10-CM | POA: Insufficient documentation

## 2018-10-02 NOTE — Telephone Encounter (Signed)
Spoke with Rita Torres pt had a fall a week and half ago did not hit her head but yesterday and today moe confused, yesterday went to get her hair done and was talking jiberish, she knows that what she is saying at times doesn't make since and will stop. She called her daughter in law looking for her husband who passed away 5 years ago today. Also thought that her daughter in law had been to her house this morning and she had not been,  When she had the fall she was given tramadol last time that was taken was 2 days ago.  I asked if she noticed  any weakness on either side? No  Any facial drooping? No Pt daughter in law stated they do not think that she has had a stroke, asking if pt needs to be seen?

## 2018-10-02 NOTE — Telephone Encounter (Signed)
We've seen this several times with these sudden changes in symptoms, pls let daughter know she needs brain imaging to rule out bleed. Pls order head CT without contrast stat, for Dx: fall, altered mental status. Thanks!

## 2018-10-02 NOTE — Telephone Encounter (Signed)
Pt POA Lattie Haw called back no answer went to voice mail message left for her to call back

## 2018-10-02 NOTE — Telephone Encounter (Signed)
Pt daughter in law Rita Torres called advised We've seen this several times with these sudden changes in symptoms, pls let daughter know she needs brain imaging to rule out bleed.  Also informed that CT has been ordered and scheduled at [facility] at 4 pm today they need to be there at 3:45pm. Verbalized understanding stated that they will be there for her to have the head CT

## 2018-10-02 NOTE — Telephone Encounter (Signed)
Lattie Haw POA daughter in law [phone] called to report pt fell about two weeks ago. Went to hospital. Knee pain No broken bones. Lattie Haw states pt is more confused more than normal & has been talking jiberish, looking for deceased spouse as well.  Please advise if pt needs sooner appt or meds change or next step.

## 2018-10-03 NOTE — Telephone Encounter (Signed)
Spoke with pt daughter in law head CT did not show any evidence of bleed. Is she having any signs of urinary tract infection, infections can also cause worsening of confusion. I have not seen her in a year, this may be progression of her dementia as well. I have an opening on July 13 at 9:30am if they can do an e-visit then. Pt scheduled for July 13th at 9:30 am for e-visit

## 2018-10-03 NOTE — Telephone Encounter (Signed)
Pls let family know that head CT did not show any evidence of bleed. Is she having any signs of urinary tract infection, infections can also cause worsening of confusion. I have not seen her in a year, this may be progression of her dementia as well. I have an opening on July 13 at 9:30am if they can do an e-visit then, thanks

## 2018-10-05 ENCOUNTER — Emergency Department (HOSPITAL_COMMUNITY)
Admission: EM | Admit: 2018-10-05 | Discharge: 2018-10-05 | Disposition: A | Discharge status: Home / Self Care | Payer: Medicare Other | Attending: Emergency Medicine | Admitting: Emergency Medicine

## 2018-10-05 ENCOUNTER — Other Ambulatory Visit: Payer: Self-pay

## 2018-10-05 ENCOUNTER — Emergency Department (HOSPITAL_COMMUNITY): Payer: Medicare Other

## 2018-10-05 DIAGNOSIS — Z79899 Other long term (current) drug therapy: Secondary | ICD-10-CM | POA: Diagnosis not present

## 2018-10-05 DIAGNOSIS — S3992XA Unspecified injury of lower back, initial encounter: Secondary | ICD-10-CM | POA: Diagnosis not present

## 2018-10-05 DIAGNOSIS — M545 Low back pain: Secondary | ICD-10-CM | POA: Insufficient documentation

## 2018-10-05 DIAGNOSIS — S79911A Unspecified injury of right hip, initial encounter: Secondary | ICD-10-CM | POA: Diagnosis not present

## 2018-10-05 DIAGNOSIS — F0390 Unspecified dementia without behavioral disturbance: Secondary | ICD-10-CM | POA: Diagnosis not present

## 2018-10-05 DIAGNOSIS — R296 Repeated falls: Secondary | ICD-10-CM | POA: Diagnosis present

## 2018-10-05 DIAGNOSIS — N390 Urinary tract infection, site not specified: Secondary | ICD-10-CM | POA: Diagnosis not present

## 2018-10-05 DIAGNOSIS — W19XXXA Unspecified fall, initial encounter: Secondary | ICD-10-CM | POA: Insufficient documentation

## 2018-10-05 DIAGNOSIS — Y939 Activity, unspecified: Secondary | ICD-10-CM | POA: Diagnosis not present

## 2018-10-05 DIAGNOSIS — R9431 Abnormal electrocardiogram [ECG] [EKG]: Secondary | ICD-10-CM | POA: Diagnosis not present

## 2018-10-05 DIAGNOSIS — R0902 Hypoxemia: Secondary | ICD-10-CM | POA: Diagnosis not present

## 2018-10-05 DIAGNOSIS — M25551 Pain in right hip: Secondary | ICD-10-CM | POA: Diagnosis not present

## 2018-10-05 LAB — BASIC METABOLIC PANEL
Anion gap: 10 (ref 5–15)
BUN: 19 mg/dL (ref 8–23)
CO2: 26 mmol/L (ref 22–32)
Calcium: 9.1 mg/dL (ref 8.9–10.3)
Chloride: 107 mmol/L (ref 98–111)
Creatinine, Ser: 0.69 mg/dL (ref 0.44–1.00)
GFR calc Af Amer: >60 mL/min (ref >60)
GFR calc non Af Amer: >60 mL/min (ref >60)
Glucose, Bld: 106 mg/dL — ABNORMAL HIGH (ref 70–99)
Potassium: 3.9 mmol/L (ref 3.5–5.1)
Sodium: 143 mmol/L (ref 135–145)

## 2018-10-05 LAB — CBC WITH DIFFERENTIAL/PLATELET
Abs Immature Granulocytes: 0.01 10*3/uL (ref 0.00–0.07)
Basophils Absolute: 0 10*3/uL (ref 0.0–0.1)
Basophils Relative: 0 %
Eosinophils Absolute: 0 10*3/uL (ref 0.0–0.5)
Eosinophils Relative: 0 %
HCT: 37.9 % (ref 36.0–46.0)
Hemoglobin: 11.4 g/dL — ABNORMAL LOW (ref 12.0–15.0)
Immature Granulocytes: 0 %
Lymphocytes Relative: 9 %
Lymphs Abs: 0.8 10*3/uL (ref 0.7–4.0)
MCH: 29.8 pg (ref 26.0–34.0)
MCHC: 30.1 g/dL (ref 30.0–36.0)
MCV: 99.2 fL (ref 80.0–100.0)
Monocytes Absolute: 0.6 10*3/uL (ref 0.1–1.0)
Monocytes Relative: 7 %
Neutro Abs: 7.1 10*3/uL (ref 1.7–7.7)
Neutrophils Relative %: 84 %
Platelets: 120 10*3/uL — ABNORMAL LOW (ref 150–400)
RBC: 3.82 MIL/uL — ABNORMAL LOW (ref 3.87–5.11)
RDW: 13.5 % (ref 11.5–15.5)
WBC: 8.5 10*3/uL (ref 4.0–10.5)
nRBC: 0 % (ref 0.0–0.2)

## 2018-10-05 LAB — URINALYSIS, ROUTINE W REFLEX MICROSCOPIC
Bilirubin Urine: NEGATIVE
Glucose, UA: NEGATIVE mg/dL
Hgb urine dipstick: NEGATIVE
Ketones, ur: NEGATIVE mg/dL
Leukocytes,Ua: NEGATIVE
Nitrite: NEGATIVE
Protein, ur: NEGATIVE mg/dL
Specific Gravity, Urine: 1.01 (ref 1.005–1.030)
pH: 6 (ref 5.0–8.0)

## 2018-10-05 NOTE — ED Notes (Signed)
Patient transported to X-ray 

## 2018-10-05 NOTE — ED Notes (Signed)
Discharge paperwork reviewed with pt and family member.  Family member reported understanding discharge information.  Pt wheeled to ED entrance by NT.

## 2018-10-05 NOTE — ED Provider Notes (Signed)
[facility]-EMERGENCY DEPT Provider Note   CSN: [id number] Arrival date & time: 10/05/18  1339     History   Chief Complaint Chief Complaint  Patient presents with  . possible uti  . Fall    HPI Rita Torres is a 85 y.o. female.     HPI   86yF s/p fall. Unclear circumstances. Family called her last night around 815 PM to check on her but she didn't answer. They weren't too concerned though because sometimes she will go to bed that early. Went to see her today and found her sitting on the floor, presumably after she fell. Pt cannot explain how she ended up on the floor. C/o her bottom being sore. Pt has dementia but lives alone. Family checks on her regularly. Seen a couple weeks ago after a fall. Has been intermittently taking tramadol 1x/day because of knee pain as a result of previous fall. No new meds otherwise.   Past Medical History:  Diagnosis Date  . Dementia (HCC)   . Dyspnea on exertion   . Grief reaction   . Memory loss   . Osteopenia 04/2015   t score -1.8    Patient Active Problem List   Diagnosis Date Noted  . Mild cognitive impairment 09/14/2016  . OAB (overactive bladder) 11/16/2013  . Osteopenia     Past Surgical History:  Procedure Laterality Date  . ABDOMINAL HYSTERECTOMY     BSO  . OOPHORECTOMY     BSO     OB History    Gravida  2   Para  2   Term  2   Preterm      AB      Living  2     SAB      TAB      Ectopic      Multiple      Live Births               Home Medications    Prior to Admission medications   Medication Sig Start Date End Date Taking? Authorizing Provider  Ascorbic Acid (VITAMIN C) 100 MG tablet Take 100 mg by mouth daily.    [provider]  b complex vitamins tablet Take 1 tablet by mouth daily.    [provider]  citalopram (CELEXA) 10 MG tablet Take 1 tablet (10 mg total) by mouth daily. 06/24/18   Van ClinesAquino, Karen M, MD  donepezil (ARICEPT) 10 MG tablet  Take 1 tablet every night 09/25/17   Van ClinesAquino, Karen M, MD    Family History Family History  Problem Relation Age of Onset  . Heart disease Father     Social History Social History   Tobacco Use  . Smoking status: Never Smoker  . Smokeless tobacco: Never Used  Substance Use Topics  . Alcohol use: No    Alcohol/week: 0.0 standard drinks  . Drug use: No     Allergies   Penicillins   Review of Systems Review of Systems All systems reviewed and negative, other than as noted in HPI.   Physical Exam Updated Vital Signs BP 125/72 (BP Location: Right Arm)   Pulse 84   Temp 98.7 F (37.1 C) (Rectal)   Resp (!) 23   SpO2 98%   Physical Exam Vitals signs and nursing note reviewed.  Constitutional:      General: She is not in acute distress.    Appearance: She is well-developed.  HENT:     Head:  Normocephalic and atraumatic.  Eyes:     General:        Right eye: No discharge.        Left eye: No discharge.     Conjunctiva/sclera: Conjunctivae normal.  Neck:     Musculoskeletal: Neck supple.  Cardiovascular:     Rate and Rhythm: Normal rate and regular rhythm.     Heart sounds: Normal heart sounds. No murmur. No friction rub. No gallop.   Pulmonary:     Effort: Pulmonary effort is normal. No respiratory distress.     Breath sounds: Normal breath sounds.  Abdominal:     General: There is no distension.     Palpations: Abdomen is soft.     Tenderness: There is no abdominal tenderness.  Musculoskeletal:        General: No tenderness.     Comments: Mild TTP lower back and towards R SI region. No overlying skin changes. Pain not repro  Skin:    General: Skin is warm and dry.  Neurological:     Mental Status: She is alert.     Comments: Answers questions. Disoriented to time. Follow simple commands. No focal deficits noted.   Psychiatric:        Behavior: Behavior normal.        Thought Content: Thought content normal.      ED Treatments / Results  Labs (all  labs ordered are listed, but only abnormal results are displayed) Labs Reviewed  CBC WITH DIFFERENTIAL/PLATELET - Abnormal; Notable for the following components:      Result Value   RBC 3.82 (*)    Hemoglobin 11.4 (*)    Platelets 120 (*)    All other components within normal limits  BASIC METABOLIC PANEL - Abnormal; Notable for the following components:   Glucose, Bld 106 (*)    All other components within normal limits  URINALYSIS, ROUTINE W REFLEX MICROSCOPIC    EKG EKG Interpretation  Date/Time:  Sunday October 05 2018 14:01:03 EDT Ventricular Rate:  83 PR Interval:    QRS Duration: 93 QT Interval:  386 QTC Calculation: 454 R Axis:   27 Text Interpretation:  Normal sinus rhythm Abnormal R-wave progression, early transition Borderline repolarization abnormality Confirmed by Virgel Manifold [id number]) on 10/05/2018 2:24:26 PM   Radiology Dg Lumbar Spine Complete  Result Date: 10/05/2018 CLINICAL DATA:  Pain following fall EXAM: LUMBAR SPINE - COMPLETE 4+ VIEW COMPARISON:  September 14, 2016 FINDINGS: Frontal, lateral, spot lumbosacral lateral, and bilateral oblique views were obtained. There are 5 non-rib-bearing lumbar type vertebral bodies. There is lumbar dextroscoliosis with rotatory component. There is no acute fracture or spondylolisthesis. There is moderately severe disc space narrowing at all levels with foci of vacuum phenomenon at L3-4 and L4-5. There is osteoarthritic change in the facets at L1-2 on the right, at L4-5 bilaterally, and at L5-S1 bilaterally. IMPRESSION: Multilevel arthropathy. Scoliosis present. No fracture or spondylolisthesis. Electronically Signed   By: Lowella Grip III M.D.   On: 10/05/2018 14:56   Dg Hip Unilat W Or Wo Pelvis 2-3 Views Right  Result Date: 10/05/2018 CLINICAL DATA:  Fall with right hip pain EXAM: DG HIP (WITH OR WITHOUT PELVIS) 2-3V RIGHT COMPARISON:  09/15/2018 FINDINGS: Pelvis radiograph is limited by rightward rotation and there is a skin  fold creating lucency over the intertrochanteric right femur. Dedicated right hip view shows no convincing fracture and no dislocation. No visible pelvic ring fracture or diastasis. Lower lumbar degenerative disc narrowing, advanced at L4-5.  IMPRESSION: No acute finding. Electronically Signed   By: Marnee SpringJonathon  Watts M.D.   On: 10/05/2018 14:55    Procedures Procedures (including critical care time)  Medications Ordered in ED Medications - No data to display   Initial Impression / Assessment and Plan / ED Course  I have reviewed the triage vital signs and the nursing notes.  Pertinent labs & imaging results that were available during my care of the patient were reviewed by me and considered in my medical decision making (see chart for details).   86yF with another fall. Lives alone although with a lot of family support. I think pt is going to need additional assistance at this point wether home health or placement somewhere. Lower back pain after fall with negative imaging. Basic labs and UA look ok. Progression of dementia? FU with PCP or neurologist. Discussed with daughter.   Final Clinical Impressions(s) / ED Diagnoses   Final diagnoses:  Fall, initial encounter    ED Discharge Orders    None       Raeford RazorKohut, Krishna Heuer, MD 10/05/18 [id number]

## 2018-10-05 NOTE — ED Notes (Signed)
Bed: HM09 Expected date: 10/05/18 Expected time: 1:44 PM Means of arrival: Ambulance Comments: Room 14, Fall

## 2018-10-05 NOTE — ED Triage Notes (Addendum)
Pt BIBA from home.  Family reports pt not answering her phone this morning, went to her house and found her lying on the floor, wrapped in blankets.  Pt able to answer questions appropriately, family reports right ankle slightly swollen.  Family suspects pt may have been on floor overnight.    Pt saw PCP few days ago for possible UTI, unclear about dx.    EMS reports VSS, pt was sitting in a chair, able to ambulate on scene.

## 2018-10-06 ENCOUNTER — Telehealth: Payer: Self-pay | Admitting: Neurology

## 2018-10-06 NOTE — Telephone Encounter (Signed)
Daughter called you back. Thanks!

## 2018-10-06 NOTE — Telephone Encounter (Signed)
Caller sayes her mother in law has fallen twice in a space of 1.5 weeks. Nothing was broken when taken to the ER yesterday. Her dementia is much worse she does not remember what happened and she is now talking to people who are not there, like her husband who died 4 years ago. Thanks!

## 2018-10-06 NOTE — Telephone Encounter (Signed)
If they can do an e-visit, I can do 12nn this Thursday, thanks

## 2018-10-06 NOTE — Telephone Encounter (Signed)
Spoke to pt daughter in law, seeing people on the fans and in different places and she sees and talks to people who have passed away. Pt is scheduled for Thursday 10/09/2018 at 12

## 2018-10-06 NOTE — Telephone Encounter (Signed)
Called Lattie Haw pt daughter in law back no answer left a voice mail for her to call back   Pt has an appointment 7/24 was 7/13 but family rescheduled.   Would you like for me to see if she can come in sooner?

## 2018-10-07 ENCOUNTER — Telehealth: Payer: Self-pay | Admitting: Neurology

## 2018-10-07 NOTE — Telephone Encounter (Signed)
Spoke with daughter in [employer]. She will stay with pt as she has been since pt had a fall. They will keep the appt on Thursday and call 911 or go to the ER if pt becomes a danger to herself.

## 2018-10-07 NOTE — Telephone Encounter (Signed)
Daughter in law is calling in upset because there has been a change from yesterday to today. She does not know who anyone is at this point. She was wondering if there is anything that she can do until the VV on Thursday. Please call her back. Thanks!

## 2018-10-08 ENCOUNTER — Other Ambulatory Visit: Payer: Self-pay

## 2018-10-08 ENCOUNTER — Telehealth (INDEPENDENT_AMBULATORY_CARE_PROVIDER_SITE_OTHER): Payer: Medicare Other | Admitting: Neurology

## 2018-10-08 DIAGNOSIS — R4182 Altered mental status, unspecified: Secondary | ICD-10-CM

## 2018-10-08 DIAGNOSIS — F0390 Unspecified dementia without behavioral disturbance: Secondary | ICD-10-CM | POA: Diagnosis not present

## 2018-10-08 DIAGNOSIS — W19XXXA Unspecified fall, initial encounter: Secondary | ICD-10-CM | POA: Diagnosis not present

## 2018-10-08 DIAGNOSIS — F03B18 Unspecified dementia, moderate, with other behavioral disturbance: Secondary | ICD-10-CM

## 2018-10-08 DIAGNOSIS — F0391 Unspecified dementia with behavioral disturbance: Secondary | ICD-10-CM

## 2018-10-08 MED ORDER — DONEPEZIL HCL 10 MG PO TABS: ORAL_TABLET | ORAL | 3 refills | Status: DC

## 2018-10-08 MED ORDER — DIVALPROEX SODIUM ER 250 MG PO TB24: ORAL_TABLET | ORAL | 1 refills | Status: DC

## 2018-10-08 MED ORDER — CITALOPRAM HYDROBROMIDE 10 MG PO TABS: 10.0000 mg | ORAL_TABLET | Freq: Every day | ORAL | 3 refills | Status: DC

## 2018-10-08 NOTE — Progress Notes (Signed)
Virtual Visit via Video Note The purpose of this virtual visit is to provide medical care while limiting exposure to the novel coronavirus.    Consent was obtained for video visit:  Yes.   Answered questions that patient had about telehealth interaction:  Yes.   I discussed the limitations, risks, security and privacy concerns of performing an evaluation and management service by telemedicine. I also discussed with the patient that there may be a patient responsible charge related to this service. The patient expressed understanding and agreed to proceed.  Pt location: Home Physician Location: office Name of referring provider:  Lawerance Cruel, MD I connected with Rita Torres at patients initiation/request on 10/08/2018 at  3:30 PM EDT by video enabled telemedicine application and verified that I am speaking with the correct person using two identifiers. Pt MRN:  [id number] Pt DOB:  [birth year] Video Participants:  Rita Torres;  Alajah Witman (daughter-in-law)   History of Present Illness:  The patient was seen as a virtual video visit on 10/08/2018. She was last seen a year ago for memory loss. Her daughter-in-law Lattie Haw is present to provide additional history. Lattie Haw had called our office 2 weeks ago to report that she had a fall 2 weeks prior and went to the hospital for knee pain and discharged on Tramadol. She started getting more confused than usual, talking gibberish and looking for her deceased husband. She thought Lattie Haw had been at her house but she had not been. She had a head CT without contrast on 6/25 which I personally reviewed, no acute changes. She was back in the ER on 6/28 after another fall. Family found her sitting on the floor reporting soreness. She could not get up and was incontinent of urine. CBC, BMP, and urinalysis were normal. Hip and lumbar xray no acute changes, there was multilevel arthropathy and scoliosis. Lattie Haw continued to report worsening cognition with  visual hallucinations, seeing people on the fans and in different places, talking to deceased people, then not recognizing family yesterday.   Lattie Haw reports that prior to the first fall, she was living alone with an aide coming 4 hours 3 days a week to help with baths, but she could do them herself. After the falls, she now has 24/7 supervision and cannot do her own baths. She sleeps well at night but wakes up very early, this morning she was outside in her night clothes. Yesterday she tried to pour water on her blanket. There is some paranoia, she thinks she is not at home and wants to go home. She denies any headaches, dizziness, focal numbness/tingling/weakness. Her gait has changed significantly as well. Her hands have been trembling and she if fidgeting a lot with them. Family has also noticed since the second fall that she would stare and look at them with a blank expression. Her right knee has always been weaker, worse after the fall. She took Tramadol yesterday and this morning. She is also on Celexa, started a year ago for mood changes, which did help her. She is taking Donepezil 10mg  daily without side effects.   HPI 09/12/2016: This is a pleasant 85 yo RH woman with no significant past medical history with worsening memory. Her family started noticing memory changes around a year ago where she was noted to have a lot of word-finding difficulties. She was started on Aricept at that time, which she only took intermittently because she felt she was not getting anywhere with it. When she stopped it,  she feels there was a difference with worsening of memory. She lives alone and denies any missed bill payments, no difficulties with ADLs, no hygiene concerns from family. Her family became concerned when she got lost driving around 2 months ago going to their house. Her son denies that she repeats herself excessively, no personality changes or hallucinations.No family history of dementia, no history of  significant head injuries or alcohol use.      Current Outpatient Medications on File Prior to Visit  Medication Sig Dispense Refill   Ascorbic Acid (VITAMIN C) 100 MG tablet Take 100 mg by mouth daily.     b complex vitamins tablet Take 1 tablet by mouth daily.     citalopram (CELEXA) 10 MG tablet Take 1 tablet (10 mg total) by mouth daily. 90 tablet 3   donepezil (ARICEPT) 10 MG tablet Take 1 tablet every night 90 tablet 3   traMADol (ULTRAM) 50 MG tablet Take 50 mg by mouth every 8 (eight) hours as needed for moderate pain.     No current facility-administered medications on file prior to visit.      Observations/Objective:   GEN:  The patient appears stated age and is in NAD.  Neurological examination: Patient is awake, alert, oriented to person, city/county/state, month/year. [place]. She states her deceased husband is in the restroom and names her daughter-in-law, son, and son-in-law. She spells WORLD as "WIRLS" and backwards says "SLRIL." She has difficulty with repetition. She cannot name. Cranial nerves: Extraocular movements intact with no nystagmus. No facial asymmetry. Motor: moves all extremities symmetrically, at least anti-gravity x 4 but with decreased fine finger movements on left hand. No incoordination on finger to nose testing. Gait: wide-based, slow and cautious (new per family). No tremor seen.   Assessment and Plan:   This is a pleasant 85 yo RH woman with worsening memory loss, lost to follow-up for a year, presenting with sudden change in mental status over the past 2 weeks. She has had 2 falls, head CT no acute changes, bloodwork/UA normal. She was started on Tramadol after the first fall, this may be iatrogenic, family advised to stop the Tramadol. MRI brain with and without contrast will be ordered to assess for underlying structural abnormality. Family reports staring off, EEG will be done as well. We discussed staring Depakote ER  250mg  qhs for behavioral changes likely related to underlying dementia, side effects discussed, we may uptitrate as tolerated. Continue Donepezil and Citalopram. Continue 24/7 supervision. She does not drive. Follow-up in 3 months, family knows to call for any changes.   Follow Up Instructions:   -I discussed the assessment and treatment plan with the patient/family. The patient/family were provided an opportunity to ask questions and all were answered. The patient/family agreed with the plan and demonstrated an understanding of the instructions.   The patient/family was advised to call back or seek an in-person evaluation if the symptoms worsen or if the condition fails to improve as anticipated.    Van ClinesKaren M Greysen Devino, MD

## 2018-10-09 ENCOUNTER — Telehealth: Payer: Medicare Other | Admitting: Neurology

## 2018-10-15 ENCOUNTER — Ambulatory Visit (INDEPENDENT_AMBULATORY_CARE_PROVIDER_SITE_OTHER): Payer: Medicare Other | Admitting: Neurology

## 2018-10-15 ENCOUNTER — Other Ambulatory Visit: Payer: Self-pay

## 2018-10-15 DIAGNOSIS — F03B18 Unspecified dementia, moderate, with other behavioral disturbance: Secondary | ICD-10-CM

## 2018-10-15 DIAGNOSIS — F0391 Unspecified dementia with behavioral disturbance: Secondary | ICD-10-CM

## 2018-10-15 DIAGNOSIS — R4182 Altered mental status, unspecified: Secondary | ICD-10-CM | POA: Diagnosis not present

## 2018-10-16 ENCOUNTER — Telehealth: Payer: Self-pay | Admitting: Neurology

## 2018-10-16 NOTE — Telephone Encounter (Signed)
Video and picture reviewed, agree with above.

## 2018-10-16 NOTE — Telephone Encounter (Signed)
Daughter in law Karla Pavone called and left message for me to call as she needed to file a complaint. I called Lattie Haw today at 8:46am and spoke with her about her concerns. She informed me that her mother in law, Mahi had an EEG done 10/15/2018. She stated that the was not allowed to go in the room with the patient when she had the EEG done. I explained that due the policy restrictions due to COVID-19 that we are currently not allowing visitors back in the patient care areas. She verbalized understanding. She then informed me that Kyasia had a gash on her head when she got home from the procedure. She stated that Pamala Hurry told her that the tech Manuela Schwartz) was mean to her. During my investigation of the complaint from [place], I was able to view the video recording of the patient and noted that the patient was restless at the beginning however appear to be more restful as the procedure went on with periods of restlessness. She did make comments during the EEG that her friends were waiting for her downstairs and they didn't know where she was.Manuela Schwartz reassured the patient that her family was aware that she was having an EEG done and that they are waiting for her in the lobby. Patient stated at one point that she was having a heart attack and once again Manuela Schwartz reassured patient that her heart rate is being monitored during the EEG. At the end of the EEG Manuela Schwartz asked the patient if she knew where she was and patient stated "don't have a clue". Patient was unable to verify today's date, who the president was, or what year it is. I explained and share all this information to Lattie Haw the daughter in [employer]. She verbalized understanding and stated that Kasumi has dementia and it is getting worse so it is hard to tell what to believe and what not to believe when she is telling them things.   I asked Lattie Haw to send me the picture of Dashonna's head so I could see the "gash" in question. At 9:14am I called Lattie Haw back to follow up with her from  the picture of Vannesa's head. Upon reviewing the picture myself and also having the Manuela Schwartz, EEG tech review the picture there is not a "gash' however there is a red mark that most likely came from the red marker used to mark the patients head for electrode placement. Patient would not allow Manuela Schwartz to clean off her head and remove the red marks once she was done. Lastly, there may be some abrasion to the scalp that could have occurred when the patients scalp was prep for the electrode placement. This was not confirmed because it is not detectable from the picture that I received. I explained this to [place] and she verbalized understanding. I instructed her to wash the area with some warm soapy water to remove the red ink. She verbalized understanding. I offered to have patient come in the office on Friday to be evaluated by Dr. Delice Lesch and Lattie Haw declined stating that getting her mother in law out once a week is about all they can do. Before I hung up I made sure I had answered all of her questions and concerns and Lattie Haw verbalized that she had no more questions or concerns. I informed her that I would leave the offer for the appointment open in case she changed her mind. All she needed to do was call and let us know if she needed to be seen.  I informed her to call me if she has any other questions or concerns. She verbalized understanding and the call was ended.

## 2018-10-16 NOTE — Telephone Encounter (Signed)
error 

## 2018-10-20 ENCOUNTER — Telehealth: Payer: Medicare Other | Admitting: Neurology

## 2018-10-21 ENCOUNTER — Telehealth: Payer: Self-pay

## 2018-10-21 NOTE — Telephone Encounter (Signed)
Per Lattie Haw pt is doing better. No more hallucinations. Pt is sleeping better at night. She is doing well with new caregiver at night.  Lattie Haw states that pt is still confused. She does not want to increase Depakote. Pt has not taken Tramadol since 10/07/18

## 2018-10-21 NOTE — Telephone Encounter (Signed)
Noted, thanks!

## 2018-10-21 NOTE — Procedures (Signed)
ELECTROENCEPHALOGRAM REPORT  Date of Study: 10/15/2018  Patient's Name: Rita Torres MRN: [id number] Date of Birth: 1933/01/06  Referring Provider: Dr. Ellouise Newer  Clinical History: This is an 85 year old woman with a sudden change in mental status, with confusion, hallucinations, blank stare at times.  Medications: Depakote, Donepezil, citalopram  Technical Summary: A multichannel digital EEG recording measured by the international 10-20 system with electrodes applied with paste and impedances below 5000 ohms performed as portable with EKG monitoring in an awake and confused patient.  Hyperventilation was not performed. Photic stimulation was performed.  The digital EEG was referentially recorded, reformatted, and digitally filtered in a variety of bipolar and referential montages for optimal display.   Description: The patient is awake and confused during the recording. There is no clear posterior dominant rhythm. The background consists of a large amount of diffuse 4-6 Hz theta slowing. Sleep was not captured. Photic stimulation did not elicit any epileptiform abnormalities however there was note of asymmetry in driving response, with better response seen in the right occipital region. There is lead artifact in the right parietal region.  There were no epileptiform discharges or electrographic seizures seen.    EKG lead was unremarkable.  Impression: This awake EEG is abnormal due to the presence of: 1. Mild to moderate diffuse slowing of the waking background 2. Asymmetric photic driving response on the left occipital region  Clinical Correlation of the above findings indicates diffuse cerebral dysfunction that is non-specific in etiology and can be seen with hypoxic/ischemic injury, toxic/metabolic encephalopathies, neurodegenerative disorders, or medication effect. Asymmetric driving response in the left occipital region may indicate underlying structural abnormality in this  region, however lead artifact in the right parietal region may also contribute to higher responses on the right side.  The absence of epileptiform discharges does not rule out a clinical diagnosis of epilepsy.  Clinical correlation is advised.   Ellouise Newer, M.D.

## 2018-10-21 NOTE — Telephone Encounter (Signed)
-----   Message from Cameron Sprang, MD sent at 10/21/2018  1:04 PM EDT ----- Pls let Rita Torres know the brain wave test showed slowing of the brain waves, which is a reflection of how she is doing. No seizure discharges seen. How is she doing? If still a lot of hallucinations, we can increase the Depakote to 2 every night. When is MRI brain scheduled? Thanks!

## 2018-10-31 ENCOUNTER — Telehealth: Payer: Medicare Other | Admitting: Neurology

## 2018-10-31 ENCOUNTER — Other Ambulatory Visit: Payer: Self-pay | Admitting: Neurology

## 2018-12-10 ENCOUNTER — Encounter

## 2018-12-10 ENCOUNTER — Encounter: Payer: Self-pay | Admitting: Neurology

## 2018-12-10 ENCOUNTER — Telehealth (INDEPENDENT_AMBULATORY_CARE_PROVIDER_SITE_OTHER): Payer: Medicare Other | Admitting: Neurology

## 2018-12-10 ENCOUNTER — Other Ambulatory Visit: Payer: Self-pay

## 2018-12-10 VITALS — Ht 67.0 in | Wt 142.0 lb

## 2018-12-10 DIAGNOSIS — F0391 Unspecified dementia with behavioral disturbance: Secondary | ICD-10-CM

## 2018-12-10 DIAGNOSIS — F03B18 Unspecified dementia, moderate, with other behavioral disturbance: Secondary | ICD-10-CM

## 2018-12-10 MED ORDER — DIVALPROEX SODIUM ER 250 MG PO TB24: ORAL_TABLET | ORAL | 3 refills | Status: DC

## 2018-12-10 MED ORDER — MEMANTINE HCL 10 MG PO TABS: ORAL_TABLET | ORAL | 11 refills | Status: DC

## 2018-12-10 MED ORDER — CITALOPRAM HYDROBROMIDE 10 MG PO TABS: 10.0000 mg | ORAL_TABLET | Freq: Every day | ORAL | 3 refills | Status: DC

## 2018-12-10 MED ORDER — DONEPEZIL HCL 10 MG PO TABS: ORAL_TABLET | ORAL | 3 refills | Status: DC

## 2018-12-10 NOTE — Progress Notes (Signed)
Virtual Visit via Video Note The purpose of this virtual visit is to provide medical care while limiting exposure to the novel coronavirus.    Consent was obtained for video visit:  Yes.   Answered questions that patient had about telehealth interaction:  Yes.   I discussed the limitations, risks, security and privacy concerns of performing an evaluation and management service by telemedicine. I also discussed with the patient that there may be a patient responsible charge related to this service. The patient expressed understanding and agreed to proceed.  Pt location: Home Physician Location: office Name of referring provider:  Lawerance Cruel, MD I connected with Rita Torres at patients initiation/request on 12/10/2018 at  3:00 PM EDT by video enabled telemedicine application and verified that I am speaking with the correct person using two identifiers. Pt MRN:  [id number] Pt DOB:  11-Aug-1932 Video Participants:  Rita Torres;  Daishia Fetterly (daughter-in-law)   History of Present Illness:  The patient was seen as a virtual video visit on 12/10/2018. She was last seen 2 months ago for sudden onset confusion and frequent falls, visual hallucinations. Head CT no acute changes, infectious workup negative. She had an EEG which showed diffuse slowing. She had been started on Tramadol 2 weeks prior to onset of confusion, family instructed to stop medication. Confusion started clearing up a couple of weeks later. She has been doing much better, able to answer questions and follow commands today. She has a 24/7 caregiver and Lattie Haw feels this has made a huge difference. They do puzzles and walk outside together. Her aide reports a significant improvement since July. She is independent with dressing and bathing. Family manages her medications and finances. She does not drive. No falls. Her right knee feels like it will fold every once in a while but ambulates without assistance. Sleep is good. She  would like for her memory to be a lot better. She has been losing weight, family reports she is not eating well. She sometimes thinks there is someone else in the house, but nothing significant like in July.   History on Initial Assessment 09/12/2016: This is a pleasant 85 yo RH woman with no significant past medical history with worsening memory. Her family started noticing memory changes around a year ago where she was noted to have a lot of word-finding difficulties. She was started on Aricept at that time, which she only took intermittently because she felt she was not getting anywhere with it. When she stopped it, she feels there was a difference with worsening of memory. She lives alone and denies any missed bill payments, no difficulties with ADLs, no hygiene concerns from family. Her family became concerned when she got lost driving around 2 months ago going to their house. Her son denies that she repeats herself excessively, no personality changes or hallucinations.No family history of dementia, no history of significant head injuries or alcohol use.      Current Outpatient Medications on File Prior to Visit  Medication Sig Dispense Refill   Ascorbic Acid (VITAMIN C) 100 MG tablet Take 100 mg by mouth daily.     b complex vitamins tablet Take 1 tablet by mouth daily.     citalopram (CELEXA) 10 MG tablet Take 1 tablet (10 mg total) by mouth daily. 90 tablet 3   divalproex (DEPAKOTE ER) 250 MG 24 hr tablet TAKE 1 TABLET BY MOUTH EVERY DAY AT NIGHT 30 tablet 2   donepezil (ARICEPT) 10 MG tablet  Take 1 tablet every night 90 tablet 3   No current facility-administered medications on file prior to visit.      Observations/Objective:   GEN:  The patient appears stated age and is in NAD.  Neurological examination: Patient is awake, alert, oriented x 3. No aphasia or dysarthria. Reduced fluency, able to follow commands. Remote and recent memory impaired. Able to name, difficulty with  repetition. Cranial nerves: Extraocular movements intact with no nystagmus. No facial asymmetry. Motor: moves all extremities symmetrically, at least anti-gravity x 4. No incoordination on finger to nose testing. Gait: slightly hunched posture, narrow-based and steady, no ataxia.  Montreal Cognitive Assessment  12/10/2018 06/17/2018 03/25/2017 09/12/2016  Visuospatial/ Executive (0/5) 2 1 3 1   Naming (0/3) 2 2 2 2   Attention: Read list of digits (0/2) 0 1 2 2   Attention: Read list of letters (0/1) 1 0 1 1  Attention: Serial 7 subtraction starting at 100 (0/3) 1 1 3 1   Language: Repeat phrase (0/2) 0 0 2 2  Language : Fluency (0/1) 0 0 0 1  Abstraction (0/2) 1 0 2 2  Delayed Recall (0/5) 1 1 0 1  Orientation (0/6) 5 2 4 6   Total 13 8 19 19     Assessment and Plan:   This is a pleasant 85 yo RH woman with worsening memory loss, who had sudden change in mental status last July 2020. Infectious workup, head CT, EEG normal. Symptoms cleared up after a couple of weeks, AMS likely due to Tramadol, which was stopped on last visit. She is doing very well, MOCA score today 13/30. We discussed addition of Memantine to Donepezil, side effects and expectations from medications discussed. Start Memantine 10mg  qhs x 2 weeks, then increase to 10mg  BID. Continue citalopram 10mg  daily and Depakote ER 250mg  qhs. Continue 24/7 supervision. She does not drive. Follow-up in 6 months, family knows to call for any changes.    Follow Up Instructions:   -I discussed the assessment and treatment plan with the patient. The patient was provided an opportunity to ask questions and all were answered. The patient agreed with the plan and demonstrated an understanding of the instructions.   The patient was advised to call back or seek an in-person evaluation if the symptoms worsen or if the condition fails to improve as anticipated.   Van ClinesKaren M Ilena Dieckman, MD

## 2019-01-02 ENCOUNTER — Other Ambulatory Visit: Payer: Self-pay | Admitting: Neurology

## 2019-01-06 DIAGNOSIS — Z23 Encounter for immunization: Secondary | ICD-10-CM | POA: Diagnosis not present

## 2019-01-15 ENCOUNTER — Encounter: Payer: Self-pay | Admitting: Gynecology

## 2019-07-17 ENCOUNTER — Ambulatory Visit: Payer: Medicare Other | Admitting: Neurology

## 2020-02-21 ENCOUNTER — Other Ambulatory Visit: Payer: Self-pay | Admitting: Neurology

## 2020-02-26 ENCOUNTER — Telehealth: Payer: Self-pay | Admitting: Neurology

## 2020-02-26 ENCOUNTER — Other Ambulatory Visit: Payer: Self-pay

## 2020-02-26 MED ORDER — DONEPEZIL HCL 10 MG PO TABS: ORAL_TABLET | ORAL | 2 refills | Status: AC

## 2020-02-26 MED ORDER — CITALOPRAM HYDROBROMIDE 10 MG PO TABS: 10.0000 mg | ORAL_TABLET | Freq: Every day | ORAL | 2 refills | Status: AC

## 2020-02-26 MED ORDER — DIVALPROEX SODIUM ER 250 MG PO TB24: ORAL_TABLET | ORAL | 2 refills | Status: AC

## 2020-02-26 MED ORDER — MEMANTINE HCL 10 MG PO TABS: ORAL_TABLET | ORAL | 2 refills | Status: AC

## 2020-02-26 NOTE — Telephone Encounter (Signed)
Do you want me to refill any of these or have her call PCP

## 2020-02-26 NOTE — Telephone Encounter (Signed)
Patient's daughter called in to schedule a follow up on 11/11/20. The patient needs her citalopram, divalproex, donepezil, and memantine refilled until the next appointment.

## 2020-02-26 NOTE — Telephone Encounter (Signed)
We can refill, I believe I have been prescribing them. Thanks

## 2020-03-22 ENCOUNTER — Emergency Department (HOSPITAL_COMMUNITY)
Admission: EM | Admit: 2020-03-22 | Discharge: 2020-03-22 | Disposition: A | Discharge status: Home / Self Care | Payer: Medicare Other | Attending: Emergency Medicine | Admitting: Emergency Medicine

## 2020-03-22 ENCOUNTER — Emergency Department (HOSPITAL_COMMUNITY): Payer: Medicare Other

## 2020-03-22 ENCOUNTER — Encounter (HOSPITAL_COMMUNITY): Payer: Self-pay

## 2020-03-22 ENCOUNTER — Other Ambulatory Visit: Payer: Self-pay

## 2020-03-22 DIAGNOSIS — R410 Disorientation, unspecified: Secondary | ICD-10-CM | POA: Insufficient documentation

## 2020-03-22 DIAGNOSIS — Z9114 Patient's other noncompliance with medication regimen: Secondary | ICD-10-CM | POA: Insufficient documentation

## 2020-03-22 DIAGNOSIS — Z79899 Other long term (current) drug therapy: Secondary | ICD-10-CM | POA: Diagnosis not present

## 2020-03-22 DIAGNOSIS — R262 Difficulty in walking, not elsewhere classified: Secondary | ICD-10-CM | POA: Diagnosis not present

## 2020-03-22 DIAGNOSIS — R5381 Other malaise: Secondary | ICD-10-CM | POA: Diagnosis not present

## 2020-03-22 DIAGNOSIS — M25551 Pain in right hip: Secondary | ICD-10-CM | POA: Diagnosis not present

## 2020-03-22 DIAGNOSIS — R52 Pain, unspecified: Secondary | ICD-10-CM | POA: Diagnosis not present

## 2020-03-22 LAB — CBC WITH DIFFERENTIAL/PLATELET
Abs Immature Granulocytes: 0.01 10*3/uL (ref 0.00–0.07)
Basophils Absolute: 0 10*3/uL (ref 0.0–0.1)
Basophils Relative: 0 %
Eosinophils Absolute: 0 10*3/uL (ref 0.0–0.5)
Eosinophils Relative: 0 %
HCT: 37.7 % (ref 36.0–46.0)
Hemoglobin: 11.8 g/dL — ABNORMAL LOW (ref 12.0–15.0)
Immature Granulocytes: 0 %
Lymphocytes Relative: 13 %
Lymphs Abs: 0.9 10*3/uL (ref 0.7–4.0)
MCH: 30.6 pg (ref 26.0–34.0)
MCHC: 31.3 g/dL (ref 30.0–36.0)
MCV: 97.9 fL (ref 80.0–100.0)
Monocytes Absolute: 0.5 10*3/uL (ref 0.1–1.0)
Monocytes Relative: 8 %
Neutro Abs: 5.5 10*3/uL (ref 1.7–7.7)
Neutrophils Relative %: 79 %
Platelets: 125 10*3/uL — ABNORMAL LOW (ref 150–400)
RBC: 3.85 MIL/uL — ABNORMAL LOW (ref 3.87–5.11)
RDW: 13.6 % (ref 11.5–15.5)
WBC: 7.1 10*3/uL (ref 4.0–10.5)
nRBC: 0 % (ref 0.0–0.2)

## 2020-03-22 LAB — BASIC METABOLIC PANEL
Anion gap: 6 (ref 5–15)
BUN: 24 mg/dL — ABNORMAL HIGH (ref 8–23)
CO2: 25 mmol/L (ref 22–32)
Calcium: 8.9 mg/dL (ref 8.9–10.3)
Chloride: 109 mmol/L (ref 98–111)
Creatinine, Ser: 0.93 mg/dL (ref 0.44–1.00)
GFR, Estimated: >60 mL/min (ref >60)
Glucose, Bld: 122 mg/dL — ABNORMAL HIGH (ref 70–99)
Potassium: 3.7 mmol/L (ref 3.5–5.1)
Sodium: 140 mmol/L (ref 135–145)

## 2020-03-22 NOTE — ED Notes (Signed)
Assisted patient to the bathroom with the steady. Patient missed the hat. No urine specimen obtained.

## 2020-03-22 NOTE — ED Provider Notes (Signed)
[facility]-EMERGENCY DEPT Provider Note   CSN: [id number] Arrival date & time: 03/22/20  1204     History Chief Complaint  Patient presents with  . Not taking dementia meds  . increased confusion    Rita Torres is a 86 y.o. female.  86 year old female who lives independently who suffers from dementia comes in today via EMS per request of her family.  She is not been taking her medications, and has having difficulty ambulating today.  She complains of no pain anywhere, she did complains of no weakness numbness or tingling recent injury recent falls.  Family says they have cameras around the house, they were watching her ambulate earlier but now she will not.        Past Medical History:  Diagnosis Date  . Dementia (HCC)   . Dyspnea on exertion   . Grief reaction   . Memory loss   . Osteopenia 04/2015   t score -1.8    Patient Active Problem List   Diagnosis Date Noted  . Mild cognitive impairment 09/14/2016  . OAB (overactive bladder) 11/16/2013  . Osteopenia     Past Surgical History:  Procedure Laterality Date  . ABDOMINAL HYSTERECTOMY     BSO  . OOPHORECTOMY     BSO     OB History    Gravida  2   Para  2   Term  2   Preterm      AB      Living  2     SAB      IAB      Ectopic      Multiple      Live Births              Family History  Problem Relation Age of Onset  . Heart disease Father     Social History   Tobacco Use  . Smoking status: Never Smoker  . Smokeless tobacco: Never Used  Vaping Use  . Vaping Use: Never used  Substance Use Topics  . Alcohol use: No    Alcohol/week: 0.0 standard drinks  . Drug use: No    Home Medications Prior to Admission medications   Medication Sig Start Date End Date Taking? Authorizing Provider  Ascorbic Acid (VITAMIN C) 100 MG tablet Take 100 mg by mouth daily.    [provider]  b complex vitamins tablet Take 1 tablet by mouth daily.     [provider]  citalopram (CELEXA) 10 MG tablet Take 1 tablet (10 mg total) by mouth daily. 02/26/20   Van Clines, MD  divalproex (DEPAKOTE ER) 250 MG 24 hr tablet TAKE 1 TABLET BY MOUTH EVERY DAY AT NIGHT 02/26/20   Van Clines, MD  donepezil (ARICEPT) 10 MG tablet Take 1 tablet every night 02/26/20   Van Clines, MD  memantine (NAMENDA) 10 MG tablet 1 TABLET TWICE A DAY 02/26/20   Van Clines, MD    Allergies    Penicillins  Review of Systems   Review of Systems  Unable to perform ROS: Dementia    Physical Exam Updated Vital Signs BP 109/84   Pulse 70   Temp 98.3 F (36.8 C) (Oral)   Resp 18   Ht 5\' 5"  (1.651 m)   Wt 68 kg   SpO2 96%   BMI 24.96 kg/m   Physical Exam Vitals and nursing note reviewed. Exam conducted with a chaperone present.  Constitutional:  General: She is not in acute distress.    Appearance: Normal appearance.  HENT:     Head: Normocephalic and atraumatic.     Nose: No rhinorrhea.  Eyes:     General:        Right eye: No discharge.        Left eye: No discharge.     Conjunctiva/sclera: Conjunctivae normal.  Cardiovascular:     Rate and Rhythm: Normal rate and regular rhythm.  Pulmonary:     Effort: Pulmonary effort is normal. No respiratory distress.     Breath sounds: No stridor.  Abdominal:     General: Abdomen is flat. There is no distension.     Palpations: Abdomen is soft.  Musculoskeletal:        General: Tenderness (bilateral hips) present. No deformity or signs of injury.     Right lower leg: No edema.     Left lower leg: No edema.     Comments: Neurovascular intact bilateral lower extremities able to range while laying in bed,  Skin:    General: Skin is warm and dry.  Neurological:     General: No focal deficit present.     Mental Status: She is alert. Mental status is at baseline.     Motor: No weakness.     Comments: Disoriented to time but oriented to situation self and place, equal motor  strength in upper lower extremity sensation intact throughout, no facial droop, normal speech  Psychiatric:        Mood and Affect: Mood normal.        Behavior: Behavior normal.     ED Results / Procedures / Treatments   Labs (all labs ordered are listed, but only abnormal results are displayed) Labs Reviewed  CBC WITH DIFFERENTIAL/PLATELET - Abnormal; Notable for the following components:      Result Value   RBC 3.85 (*)    Hemoglobin 11.8 (*)    Platelets 125 (*)    All other components within normal limits  BASIC METABOLIC PANEL - Abnormal; Notable for the following components:   Glucose, Bld 122 (*)    BUN 24 (*)    All other components within normal limits    EKG None  Radiology DG Hips Bilat W or Wo Pelvis 2 Views  Result Date: 03/22/2020 CLINICAL DATA:  Dementia.  Possible right hip pain. EXAM: DG HIP (WITH OR WITHOUT PELVIS) 2V BILAT COMPARISON:  Right hip x-rays dated October 05, 2018. FINDINGS: There is no evidence of hip fracture or dislocation. There is no evidence of arthropathy or other focal bone abnormality. IMPRESSION: Negative. Electronically Signed   By: Obie Dredge M.D.   On: 03/22/2020 14:14    Procedures Procedures (including critical care time)  Medications Ordered in ED Medications - No data to display  ED Course  I have reviewed the triage vital signs and the nursing notes.  Pertinent labs & imaging results that were available during my care of the patient were reviewed by me and considered in my medical decision making (see chart for details).    MDM Rules/Calculators/A&P                          History of dementia, not taking her meds, difficulty ambulating.  Family's concern for fall and hip pathology will get plain films, will get screening labs.  May need PT evaluation for difficulty with ambulation.  X-ray reviewed by radiology myself shows no  acute fracture or malalignment. Laboratory studies thus far to include chemistry and CBC  reviewed by me show no acute derangements. PT eval and treat needed.  Patient's son is at bedside and is her the lab results, and feels that his mother is looking much better, she is moving her legs more.  He now feels comfortable taking her home does not need further assessment.  He is offered physical therapy evaluation further evaluation to include urinalysis, and declines both.  He understands the risk and benefit of not doing these assessments.  He has an appointment today with a memory care facility to see if his mother is an appropriate fit for this facility.  He wishes to attend that meeting with her to make sure he has good long-term care for.  He is invited to come back at any time for reassessment of his mother.  Final Clinical Impression(s) / ED Diagnoses Final diagnoses:  Difficulty in walking    Rx / DC Orders ED Discharge Orders    None       Sabino Donovan, MD 03/22/20 1441

## 2020-03-22 NOTE — ED Triage Notes (Signed)
Per EMS- patient lives alone and family states they try to check on her frequently. Family reports that the patient has not been taking her dementia meds x 1 week. Patient has increased confusion and walks around all night. Family reported that the patient c/o right hip pain. EMS reported that the patient walked with assistance to the bathroom and walked out of the bathroom. Patient did not c/o right hip pain. No bruising or deformity noted.

## 2020-03-29 DIAGNOSIS — D649 Anemia, unspecified: Secondary | ICD-10-CM | POA: Diagnosis not present

## 2020-03-29 DIAGNOSIS — F0390 Unspecified dementia without behavioral disturbance: Secondary | ICD-10-CM | POA: Diagnosis not present

## 2020-03-29 DIAGNOSIS — E878 Other disorders of electrolyte and fluid balance, not elsewhere classified: Secondary | ICD-10-CM | POA: Diagnosis not present

## 2020-03-29 DIAGNOSIS — Z022 Encounter for examination for admission to residential institution: Secondary | ICD-10-CM | POA: Diagnosis not present

## 2020-03-29 DIAGNOSIS — R2681 Unsteadiness on feet: Secondary | ICD-10-CM | POA: Diagnosis not present

## 2020-04-13 DIAGNOSIS — N39498 Other specified urinary incontinence: Secondary | ICD-10-CM | POA: Diagnosis not present

## 2020-04-13 DIAGNOSIS — M179 Osteoarthritis of knee, unspecified: Secondary | ICD-10-CM | POA: Diagnosis not present

## 2020-04-13 DIAGNOSIS — F0280 Dementia in other diseases classified elsewhere without behavioral disturbance: Secondary | ICD-10-CM | POA: Diagnosis not present

## 2020-05-04 DIAGNOSIS — F0280 Dementia in other diseases classified elsewhere without behavioral disturbance: Secondary | ICD-10-CM | POA: Diagnosis not present

## 2020-05-11 DIAGNOSIS — F0280 Dementia in other diseases classified elsewhere without behavioral disturbance: Secondary | ICD-10-CM | POA: Diagnosis not present

## 2020-05-11 DIAGNOSIS — U071 COVID-19: Secondary | ICD-10-CM | POA: Diagnosis not present

## 2020-06-08 ENCOUNTER — Encounter (HOSPITAL_COMMUNITY): Payer: Self-pay

## 2020-06-08 ENCOUNTER — Emergency Department (HOSPITAL_COMMUNITY): Payer: Medicare Other

## 2020-06-08 ENCOUNTER — Emergency Department (HOSPITAL_COMMUNITY)
Admission: EM | Admit: 2020-06-08 | Discharge: 2020-06-08 | Disposition: A | Discharge status: Home / Self Care | Payer: Medicare Other | Attending: Emergency Medicine | Admitting: Emergency Medicine

## 2020-06-08 ENCOUNTER — Other Ambulatory Visit: Payer: Self-pay

## 2020-06-08 DIAGNOSIS — S300XXA Contusion of lower back and pelvis, initial encounter: Secondary | ICD-10-CM | POA: Diagnosis not present

## 2020-06-08 DIAGNOSIS — S8011XA Contusion of right lower leg, initial encounter: Secondary | ICD-10-CM | POA: Insufficient documentation

## 2020-06-08 DIAGNOSIS — M545 Low back pain, unspecified: Secondary | ICD-10-CM

## 2020-06-08 DIAGNOSIS — S7002XA Contusion of left hip, initial encounter: Secondary | ICD-10-CM | POA: Diagnosis not present

## 2020-06-08 DIAGNOSIS — M6281 Muscle weakness (generalized): Secondary | ICD-10-CM | POA: Diagnosis not present

## 2020-06-08 DIAGNOSIS — Z043 Encounter for examination and observation following other accident: Secondary | ICD-10-CM | POA: Diagnosis not present

## 2020-06-08 DIAGNOSIS — W19XXXA Unspecified fall, initial encounter: Secondary | ICD-10-CM | POA: Diagnosis not present

## 2020-06-08 DIAGNOSIS — R296 Repeated falls: Secondary | ICD-10-CM | POA: Diagnosis not present

## 2020-06-08 DIAGNOSIS — M5459 Other low back pain: Secondary | ICD-10-CM | POA: Diagnosis not present

## 2020-06-08 DIAGNOSIS — R4182 Altered mental status, unspecified: Secondary | ICD-10-CM | POA: Diagnosis not present

## 2020-06-08 DIAGNOSIS — S20212A Contusion of left front wall of thorax, initial encounter: Secondary | ICD-10-CM | POA: Diagnosis not present

## 2020-06-08 DIAGNOSIS — Z743 Need for continuous supervision: Secondary | ICD-10-CM | POA: Diagnosis not present

## 2020-06-08 DIAGNOSIS — S0990XA Unspecified injury of head, initial encounter: Secondary | ICD-10-CM | POA: Diagnosis not present

## 2020-06-08 DIAGNOSIS — M549 Dorsalgia, unspecified: Secondary | ICD-10-CM | POA: Diagnosis not present

## 2020-06-08 DIAGNOSIS — S8991XA Unspecified injury of right lower leg, initial encounter: Secondary | ICD-10-CM | POA: Diagnosis present

## 2020-06-08 DIAGNOSIS — R404 Transient alteration of awareness: Secondary | ICD-10-CM | POA: Diagnosis not present

## 2020-06-08 DIAGNOSIS — F0390 Unspecified dementia without behavioral disturbance: Secondary | ICD-10-CM | POA: Diagnosis not present

## 2020-06-08 DIAGNOSIS — R279 Unspecified lack of coordination: Secondary | ICD-10-CM | POA: Diagnosis not present

## 2020-06-08 DIAGNOSIS — W07XXXA Fall from chair, initial encounter: Secondary | ICD-10-CM | POA: Diagnosis not present

## 2020-06-08 DIAGNOSIS — S299XXA Unspecified injury of thorax, initial encounter: Secondary | ICD-10-CM | POA: Diagnosis not present

## 2020-06-08 MED ORDER — ACETAMINOPHEN 500 MG PO TABS
1000.0000 mg | ORAL_TABLET | Freq: Once | ORAL | Status: AC
  Administered 2020-06-08: 1000 mg via ORAL
  Filled 2020-06-08: qty 2

## 2020-06-08 NOTE — ED Notes (Signed)
Epiphany Seltzer (daughter-in-law) [phone]

## 2020-06-08 NOTE — ED Triage Notes (Signed)
EMS reports from [facility], witnessed fall, fell against piano, no LOC, no blood thinners, denies head strike, no obvious injuries. Pt c/o lower back pain. Hx of Dementia.  BP 144/82 HR 80 RR 18 Sp02 98 RA CBG 145

## 2020-06-08 NOTE — ED Provider Notes (Signed)
[facility]-EMERGENCY DEPT Provider Note   CSN: [id number] Arrival date & time: 06/08/20  1600     History Chief Complaint  Patient presents with  . Fall    Rita Torres is a 87 y.o. female.  HPI      87 year old female with history of dementia, presents from [place] [facility] with concern for fall.  On my history, she knows that she is in the hospital, but when asked why she is here she reports that someone dropped the ball.  Per EMS, she had had a witnessed fall against a piano.  They denied any known LOC, blood per thinner use or head injury.  Reports some pain to her left buttock and back.  Denies shortness of breath, chest pain, abdominal pain, pain to the extremities.  Attempted to call [facility] place but they did not answer. Daughter reports she has been falling more frequently.     Past Medical History:  Diagnosis Date  . Dementia (HCC)   . Dyspnea on exertion   . Grief reaction   . Memory loss   . Osteopenia 04/2015   t score -1.8    Patient Active Problem List   Diagnosis Date Noted  . Mild cognitive impairment 09/14/2016  . OAB (overactive bladder) 11/16/2013  . Osteopenia     Past Surgical History:  Procedure Laterality Date  . ABDOMINAL HYSTERECTOMY     BSO  . OOPHORECTOMY     BSO     OB History    Gravida  2   Para  2   Term  2   Preterm      AB      Living  2     SAB      IAB      Ectopic      Multiple      Live Births              Family History  Problem Relation Age of Onset  . Heart disease Father     Social History   Tobacco Use  . Smoking status: Never Smoker  . Smokeless tobacco: Never Used  Vaping Use  . Vaping Use: Never used  Substance Use Topics  . Alcohol use: No    Alcohol/week: 0.0 standard drinks  . Drug use: No    Home Medications Prior to Admission medications   Medication Sig Start Date End Date Taking? Authorizing Provider  Ascorbic Acid (VITAMIN C) 100 MG tablet  Take 100 mg by mouth daily.    [provider]  b complex vitamins tablet Take 1 tablet by mouth daily.    [provider]  citalopram (CELEXA) 10 MG tablet Take 1 tablet (10 mg total) by mouth daily. 02/26/20   Van Clines, MD  divalproex (DEPAKOTE ER) 250 MG 24 hr tablet TAKE 1 TABLET BY MOUTH EVERY DAY AT NIGHT 02/26/20   Van Clines, MD  donepezil (ARICEPT) 10 MG tablet Take 1 tablet every night 02/26/20   Van Clines, MD  memantine [facility]) 10 MG tablet 1 TABLET TWICE A DAY 02/26/20   Van Clines, MD    Allergies    Penicillins  Review of Systems   Review of Systems  Unable to perform ROS: Dementia  Constitutional: Negative for fever.  Respiratory: Negative for shortness of breath.   Cardiovascular: Negative for chest pain.  Gastrointestinal: Negative for abdominal pain.  Genitourinary: Positive for flank pain.  Musculoskeletal: Positive for back pain.  Negative for arthralgias.  Neurological: Negative for weakness, numbness and headaches.    Physical Exam Updated Vital Signs BP 128/69 (BP Location: Right Arm)   Pulse 71   Temp 97.6 F (36.4 C)   Resp 13   SpO2 100%   Physical Exam Vitals and nursing note reviewed.  Constitutional:      General: She is not in acute distress.    Appearance: She is well-developed and well-nourished. She is not diaphoretic.  HENT:     Head: Normocephalic and atraumatic.  Eyes:     Extraocular Movements: EOM normal.     Conjunctiva/sclera: Conjunctivae normal.  Cardiovascular:     Rate and Rhythm: Normal rate and regular rhythm.     Pulses: Intact distal pulses.     Heart sounds: Normal heart sounds. No murmur heard. No friction rub. No gallop.   Pulmonary:     Effort: Pulmonary effort is normal. No respiratory distress.     Breath sounds: Normal breath sounds. No wheezing or rales.     Comments: No chest wall but mild left posterior rib, faint contusion Chest:     Chest wall: No tenderness.   Abdominal:     General: There is no distension.     Palpations: Abdomen is soft.     Tenderness: There is no abdominal tenderness. There is no guarding.  Musculoskeletal:        General: Tenderness (Cspine, lspine, faint contusion left hip) present. No edema.     Cervical back: Normal range of motion.  Skin:    General: Skin is warm and dry.     Findings: No erythema or rash.     Comments: Contusion right lower extremity Normal pulses bilaterally, no asymmetric leg swelling  Neurological:     Mental Status: She is alert.     Comments: Oriented to self and location Normal strength sensation     ED Results / Procedures / Treatments   Labs (all labs ordered are listed, but only abnormal results are displayed) Labs Reviewed - No data to display  EKG None  Radiology DG Chest 2 View  Result Date: 06/08/2020 CLINICAL DATA:  Fall EXAM: CHEST - 2 VIEW COMPARISON:  None. FINDINGS: The heart size and mediastinal contours are within normal limits. Both lungs are clear. The visualized skeletal structures are unremarkable. IMPRESSION: No active cardiopulmonary disease. Electronically Signed   By: Jasmine PangKim  Fujinaga M.D.   On: 06/08/2020 17:25   DG Pelvis 1-2 Views  Result Date: 06/08/2020 CLINICAL DATA:  Fall EXAM: PELVIS - 1-2 VIEW COMPARISON:  03/22/2020 FINDINGS: SI joints are non widened. Pubic symphysis and rami appear intact. No fracture or malalignment IMPRESSION: Negative. Electronically Signed   By: Jasmine PangKim  Fujinaga M.D.   On: 06/08/2020 17:25   CT Head Wo Contrast  Result Date: 06/08/2020 CLINICAL DATA:  Head trauma, witnessed fall. EXAM: CT HEAD WITHOUT CONTRAST CT CERVICAL SPINE WITHOUT CONTRAST TECHNIQUE: Multidetector CT imaging of the head and cervical spine was performed following the standard protocol without intravenous contrast. Multiplanar CT image reconstructions of the cervical spine were also generated. COMPARISON:  Head CT dated 10/02/2018 FINDINGS: CT HEAD FINDINGS Brain:  There is generalized age related parenchymal atrophy with commensurate dilatation of the ventricles and sulci. Mild chronic small vessel ischemic changes are present within the deep periventricular white matter regions bilaterally. No mass, hemorrhage, edema or other evidence of acute parenchymal abnormality. No extra-axial hemorrhage. Vascular: Chronic calcified atherosclerotic changes of the large vessels at the skull base. No  unexpected hyperdense vessel. Skull: Normal. Negative for fracture or focal lesion. Sinuses/Orbits: No acute finding. Other: None. CT CERVICAL SPINE FINDINGS Alignment: Mild levoscoliosis, possibly accentuated by patient positioning. No evidence of acute vertebral body subluxation. Skull base and vertebrae: No fracture line or displaced fracture fragment. Facet joints appear normally aligned throughout. Soft tissues and spinal canal: No prevertebral fluid or swelling. No visible canal hematoma. Disc levels: Degenerative spondylosis throughout the mid and lower cervical spine, mild to moderate in degree, but no more than mild central canal stenosis at any level. Upper chest: Negative. Other: None. IMPRESSION: 1. No acute intracranial abnormality. No intracranial mass, hemorrhage or edema. No skull fracture. Chronic small vessel ischemic changes within the white matter. 2. No fracture or acute subluxation within the cervical spine. Degenerative changes of the mid and lower cervical spine, mild to moderate in degree, but no more than mild central canal stenosis at any level. Electronically Signed   By: Bary Richard M.D.   On: 06/08/2020 17:02   CT Cervical Spine Wo Contrast  Result Date: 06/08/2020 CLINICAL DATA:  Head trauma, witnessed fall. EXAM: CT HEAD WITHOUT CONTRAST CT CERVICAL SPINE WITHOUT CONTRAST TECHNIQUE: Multidetector CT imaging of the head and cervical spine was performed following the standard protocol without intravenous contrast. Multiplanar CT image reconstructions of the  cervical spine were also generated. COMPARISON:  Head CT dated 10/02/2018 FINDINGS: CT HEAD FINDINGS Brain: There is generalized age related parenchymal atrophy with commensurate dilatation of the ventricles and sulci. Mild chronic small vessel ischemic changes are present within the deep periventricular white matter regions bilaterally. No mass, hemorrhage, edema or other evidence of acute parenchymal abnormality. No extra-axial hemorrhage. Vascular: Chronic calcified atherosclerotic changes of the large vessels at the skull base. No unexpected hyperdense vessel. Skull: Normal. Negative for fracture or focal lesion. Sinuses/Orbits: No acute finding. Other: None. CT CERVICAL SPINE FINDINGS Alignment: Mild levoscoliosis, possibly accentuated by patient positioning. No evidence of acute vertebral body subluxation. Skull base and vertebrae: No fracture line or displaced fracture fragment. Facet joints appear normally aligned throughout. Soft tissues and spinal canal: No prevertebral fluid or swelling. No visible canal hematoma. Disc levels: Degenerative spondylosis throughout the mid and lower cervical spine, mild to moderate in degree, but no more than mild central canal stenosis at any level. Upper chest: Negative. Other: None. IMPRESSION: 1. No acute intracranial abnormality. No intracranial mass, hemorrhage or edema. No skull fracture. Chronic small vessel ischemic changes within the white matter. 2. No fracture or acute subluxation within the cervical spine. Degenerative changes of the mid and lower cervical spine, mild to moderate in degree, but no more than mild central canal stenosis at any level. Electronically Signed   By: Bary Richard M.D.   On: 06/08/2020 17:02   CT Thoracic Spine Wo Contrast  Result Date: 06/08/2020 CLINICAL DATA:  Lower back pain and trauma EXAM: CT thoracic and LUMBAR SPINE WITHOUT CONTRAST TECHNIQUE: Multidetector CT imaging of the lumbar spine was performed without intravenous  contrast administration. Multiplanar CT image reconstructions were also generated. COMPARISON:  None. FINDINGS: Alignment: Normal Vertebrae: Vertebral body heights are well maintained. No fracture, cortical destruction, or osseous lesion. Paraspinal and other soft tissues: Normal appearance to the paraspinal soft tissues and retroperitoneum. Disc levels: Thoracic spine spondylosis is seen with mild disc height loss and anterior osteophytes most notable at T10-T11, however no significant canal or neural foraminal narrowing is seen. Lumbar spine: Segmentation: There are 5 non-rib bearing lumbar type vertebral bodies with the last  intervertebral disc space labeled as L5-S1. Alignment: Normal Vertebrae: The vertebral body heights are well maintained. No fracture, malalignment, or pathologic osseous lesions seen. Paraspinal and other soft tissues: The paraspinal soft tissues and visualized retroperitoneal structures are unremarkable. The sacroiliac joints are intact. Scattered aortic atherosclerosis is seen. Disc levels: Multilevel lumbar spine spondylosis seen with disc height loss and facet arthrosis most notable at L4-L5 with moderate neural foraminal narrowing and mild central canal stenosis. IMPRESSION: No acute fracture or malalignment of the thoracic or lumbar spine Electronically Signed   By: Jonna Clark M.D.   On: 06/08/2020 19:04   CT Lumbar Spine Wo Contrast  Result Date: 06/08/2020 CLINICAL DATA:  Lower back pain and trauma EXAM: CT thoracic and LUMBAR SPINE WITHOUT CONTRAST TECHNIQUE: Multidetector CT imaging of the lumbar spine was performed without intravenous contrast administration. Multiplanar CT image reconstructions were also generated. COMPARISON:  None. FINDINGS: Alignment: Normal Vertebrae: Vertebral body heights are well maintained. No fracture, cortical destruction, or osseous lesion. Paraspinal and other soft tissues: Normal appearance to the paraspinal soft tissues and retroperitoneum. Disc  levels: Thoracic spine spondylosis is seen with mild disc height loss and anterior osteophytes most notable at T10-T11, however no significant canal or neural foraminal narrowing is seen. Lumbar spine: Segmentation: There are 5 non-rib bearing lumbar type vertebral bodies with the last intervertebral disc space labeled as L5-S1. Alignment: Normal Vertebrae: The vertebral body heights are well maintained. No fracture, malalignment, or pathologic osseous lesions seen. Paraspinal and other soft tissues: The paraspinal soft tissues and visualized retroperitoneal structures are unremarkable. The sacroiliac joints are intact. Scattered aortic atherosclerosis is seen. Disc levels: Multilevel lumbar spine spondylosis seen with disc height loss and facet arthrosis most notable at L4-L5 with moderate neural foraminal narrowing and mild central canal stenosis. IMPRESSION: No acute fracture or malalignment of the thoracic or lumbar spine Electronically Signed   By: Jonna Clark M.D.   On: 06/08/2020 19:04    Procedures Procedures   Medications Ordered in ED Medications  acetaminophen (TYLENOL) tablet 1,000 mg (1,000 mg Oral Given 06/08/20 1740)    ED Course  I have reviewed the triage vital signs and the nursing notes.  Pertinent labs & imaging results that were available during my care of the patient were reviewed by me and considered in my medical decision making (see chart for details).    MDM Rules/Calculators/A&P                           87 year old female with history of dementia, presents from [place] [facility] with concern for fall.    Reports C-spine tenderness on my exam, and given this with unclear history, possible head trauma and pain, CT head and cervical spine were ordered which showed no acute traumatic abnormalities.   X-ray of the chest, pelvis,shows no acute findings..  Has very small contusion over her left flank and left buttock/upper hip.  She has good ROM of legs, normal pulses, no  sign of hip fracture or neurologic deficit.  No significant pain and able to take deep breaths and have low suspicion for occult fx at this time.  Radiology unable to complete lumbar XR due to cooperation, however given pain and tenderness in both T and L spine with history of trauma and pain, CT ordered for close evaluation and shows no sign of acute traumatic injury.  Called [facility] to discuss falls. Her vital signs are stable, no signs of sepsis, no  focal findings to suggest CVA and feel that continued evaluation is appropriate to do as an outpatient regarding falls.   Final Clinical Impression(s) / ED Diagnoses Final diagnoses:  Fall, initial encounter  Acute low back pain without sciatica, unspecified back pain laterality    Rx / DC Orders ED Discharge Orders    None       Alvira Monday, MD 06/09/20 2340

## 2020-06-13 DIAGNOSIS — N390 Urinary tract infection, site not specified: Secondary | ICD-10-CM | POA: Diagnosis not present

## 2020-06-15 DIAGNOSIS — M545 Low back pain, unspecified: Secondary | ICD-10-CM | POA: Diagnosis not present

## 2020-06-15 DIAGNOSIS — F0280 Dementia in other diseases classified elsewhere without behavioral disturbance: Secondary | ICD-10-CM | POA: Diagnosis not present

## 2020-06-15 DIAGNOSIS — W01198A Fall on same level from slipping, tripping and stumbling with subsequent striking against other object, initial encounter: Secondary | ICD-10-CM | POA: Diagnosis not present

## 2020-06-16 DIAGNOSIS — Z79899 Other long term (current) drug therapy: Secondary | ICD-10-CM | POA: Diagnosis not present

## 2020-06-16 DIAGNOSIS — Z9181 History of falling: Secondary | ICD-10-CM | POA: Diagnosis not present

## 2020-06-16 DIAGNOSIS — F0280 Dementia in other diseases classified elsewhere without behavioral disturbance: Secondary | ICD-10-CM | POA: Diagnosis not present

## 2020-06-16 DIAGNOSIS — Z993 Dependence on wheelchair: Secondary | ICD-10-CM | POA: Diagnosis not present

## 2020-06-16 DIAGNOSIS — G309 Alzheimer's disease, unspecified: Secondary | ICD-10-CM | POA: Diagnosis not present

## 2020-06-16 DIAGNOSIS — M170 Bilateral primary osteoarthritis of knee: Secondary | ICD-10-CM | POA: Diagnosis not present

## 2020-06-22 DIAGNOSIS — F0280 Dementia in other diseases classified elsewhere without behavioral disturbance: Secondary | ICD-10-CM | POA: Diagnosis not present

## 2020-06-22 DIAGNOSIS — N390 Urinary tract infection, site not specified: Secondary | ICD-10-CM | POA: Diagnosis not present

## 2020-07-22 DIAGNOSIS — G309 Alzheimer's disease, unspecified: Secondary | ICD-10-CM | POA: Diagnosis not present

## 2020-07-22 DIAGNOSIS — Z9181 History of falling: Secondary | ICD-10-CM | POA: Diagnosis not present

## 2020-07-22 DIAGNOSIS — Z993 Dependence on wheelchair: Secondary | ICD-10-CM | POA: Diagnosis not present

## 2020-07-22 DIAGNOSIS — Z79899 Other long term (current) drug therapy: Secondary | ICD-10-CM | POA: Diagnosis not present

## 2020-07-22 DIAGNOSIS — M170 Bilateral primary osteoarthritis of knee: Secondary | ICD-10-CM | POA: Diagnosis not present

## 2020-07-22 DIAGNOSIS — F0280 Dementia in other diseases classified elsewhere without behavioral disturbance: Secondary | ICD-10-CM | POA: Diagnosis not present

## 2020-11-01 DIAGNOSIS — M25561 Pain in right knee: Secondary | ICD-10-CM | POA: Diagnosis not present

## 2020-11-02 DIAGNOSIS — W07XXXA Fall from chair, initial encounter: Secondary | ICD-10-CM | POA: Diagnosis not present

## 2020-11-02 DIAGNOSIS — M25569 Pain in unspecified knee: Secondary | ICD-10-CM | POA: Diagnosis not present

## 2020-11-02 DIAGNOSIS — M25461 Effusion, right knee: Secondary | ICD-10-CM | POA: Diagnosis not present

## 2020-11-02 DIAGNOSIS — M179 Osteoarthritis of knee, unspecified: Secondary | ICD-10-CM | POA: Diagnosis not present

## 2020-11-11 ENCOUNTER — Ambulatory Visit: Payer: PRIVATE HEALTH INSURANCE | Admitting: Neurology

## 2020-12-21 DIAGNOSIS — F0280 Dementia in other diseases classified elsewhere without behavioral disturbance: Secondary | ICD-10-CM | POA: Diagnosis not present

## 2020-12-21 DIAGNOSIS — R634 Abnormal weight loss: Secondary | ICD-10-CM | POA: Diagnosis not present

## 2021-01-04 DIAGNOSIS — G309 Alzheimer's disease, unspecified: Secondary | ICD-10-CM | POA: Diagnosis not present

## 2021-01-04 DIAGNOSIS — R451 Restlessness and agitation: Secondary | ICD-10-CM | POA: Diagnosis not present

## 2021-02-01 DIAGNOSIS — N39498 Other specified urinary incontinence: Secondary | ICD-10-CM | POA: Diagnosis not present

## 2021-02-01 DIAGNOSIS — M179 Osteoarthritis of knee, unspecified: Secondary | ICD-10-CM | POA: Diagnosis not present

## 2021-02-01 DIAGNOSIS — F0280 Dementia in other diseases classified elsewhere without behavioral disturbance: Secondary | ICD-10-CM | POA: Diagnosis not present

## 2021-02-01 DIAGNOSIS — M25569 Pain in unspecified knee: Secondary | ICD-10-CM | POA: Diagnosis not present

## 2021-02-02 ENCOUNTER — Telehealth: Payer: Self-pay

## 2021-02-02 NOTE — Telephone Encounter (Signed)
New message    A prior authorization request for Ativan 0.5 mg.   Ativan medication is not listed under the patient current meds list.   Verify by Dr. Karel Jarvis LPN.

## 2021-02-11 ENCOUNTER — Encounter (HOSPITAL_COMMUNITY): Payer: Self-pay

## 2021-02-11 ENCOUNTER — Emergency Department (HOSPITAL_COMMUNITY): Payer: Medicare Other

## 2021-02-11 ENCOUNTER — Inpatient Hospital Stay (HOSPITAL_COMMUNITY)
Admission: EM | Admit: 2021-02-11 | Discharge: 2021-02-17 | DRG: 480 | Disposition: A | Discharge status: Skilled Nursing Facility | Payer: Medicare Other | Source: Skilled Nursing Facility | Attending: Internal Medicine | Admitting: Internal Medicine

## 2021-02-11 ENCOUNTER — Other Ambulatory Visit: Payer: Self-pay

## 2021-02-11 DIAGNOSIS — Z880 Allergy status to penicillin: Secondary | ICD-10-CM

## 2021-02-11 DIAGNOSIS — S72011A Unspecified intracapsular fracture of right femur, initial encounter for closed fracture: Principal | ICD-10-CM | POA: Diagnosis present

## 2021-02-11 DIAGNOSIS — F03918 Unspecified dementia, unspecified severity, with other behavioral disturbance: Secondary | ICD-10-CM | POA: Diagnosis present

## 2021-02-11 DIAGNOSIS — Z419 Encounter for procedure for purposes other than remedying health state, unspecified: Secondary | ICD-10-CM

## 2021-02-11 DIAGNOSIS — E559 Vitamin D deficiency, unspecified: Secondary | ICD-10-CM | POA: Diagnosis present

## 2021-02-11 DIAGNOSIS — W06XXXA Fall from bed, initial encounter: Secondary | ICD-10-CM | POA: Diagnosis present

## 2021-02-11 DIAGNOSIS — Z90722 Acquired absence of ovaries, bilateral: Secondary | ICD-10-CM | POA: Diagnosis not present

## 2021-02-11 DIAGNOSIS — D696 Thrombocytopenia, unspecified: Secondary | ICD-10-CM | POA: Diagnosis not present

## 2021-02-11 DIAGNOSIS — Z90710 Acquired absence of both cervix and uterus: Secondary | ICD-10-CM | POA: Diagnosis not present

## 2021-02-11 DIAGNOSIS — Z79899 Other long term (current) drug therapy: Secondary | ICD-10-CM

## 2021-02-11 DIAGNOSIS — F32A Depression, unspecified: Secondary | ICD-10-CM | POA: Diagnosis present

## 2021-02-11 DIAGNOSIS — Z20822 Contact with and (suspected) exposure to covid-19: Secondary | ICD-10-CM | POA: Diagnosis present

## 2021-02-11 DIAGNOSIS — S72001A Fracture of unspecified part of neck of right femur, initial encounter for closed fracture: Secondary | ICD-10-CM | POA: Diagnosis present

## 2021-02-11 DIAGNOSIS — R400 Somnolence: Secondary | ICD-10-CM | POA: Diagnosis present

## 2021-02-11 DIAGNOSIS — Z751 Person awaiting admission to adequate facility elsewhere: Secondary | ICD-10-CM

## 2021-02-11 DIAGNOSIS — F419 Anxiety disorder, unspecified: Secondary | ICD-10-CM | POA: Diagnosis present

## 2021-02-11 DIAGNOSIS — Y92122 Bedroom in nursing home as the place of occurrence of the external cause: Secondary | ICD-10-CM | POA: Diagnosis not present

## 2021-02-11 DIAGNOSIS — M2550 Pain in unspecified joint: Secondary | ICD-10-CM | POA: Diagnosis not present

## 2021-02-11 DIAGNOSIS — Z7401 Bed confinement status: Secondary | ICD-10-CM | POA: Diagnosis not present

## 2021-02-11 DIAGNOSIS — S72145A Nondisplaced intertrochanteric fracture of left femur, initial encounter for closed fracture: Secondary | ICD-10-CM

## 2021-02-11 DIAGNOSIS — Z8249 Family history of ischemic heart disease and other diseases of the circulatory system: Secondary | ICD-10-CM

## 2021-02-11 DIAGNOSIS — Z7189 Other specified counseling: Secondary | ICD-10-CM | POA: Diagnosis not present

## 2021-02-11 DIAGNOSIS — M810 Age-related osteoporosis without current pathological fracture: Secondary | ICD-10-CM | POA: Diagnosis present

## 2021-02-11 DIAGNOSIS — Z66 Do not resuscitate: Secondary | ICD-10-CM | POA: Diagnosis not present

## 2021-02-11 DIAGNOSIS — F0393 Unspecified dementia, unspecified severity, with mood disturbance: Secondary | ICD-10-CM | POA: Diagnosis present

## 2021-02-11 DIAGNOSIS — G9341 Metabolic encephalopathy: Secondary | ICD-10-CM | POA: Diagnosis not present

## 2021-02-11 DIAGNOSIS — F0394 Unspecified dementia, unspecified severity, with anxiety: Secondary | ICD-10-CM | POA: Diagnosis not present

## 2021-02-11 DIAGNOSIS — L89891 Pressure ulcer of other site, stage 1: Secondary | ICD-10-CM | POA: Diagnosis not present

## 2021-02-11 DIAGNOSIS — S72001D Fracture of unspecified part of neck of right femur, subsequent encounter for closed fracture with routine healing: Secondary | ICD-10-CM | POA: Diagnosis not present

## 2021-02-11 DIAGNOSIS — R000 Tachycardia, unspecified: Secondary | ICD-10-CM | POA: Diagnosis not present

## 2021-02-11 DIAGNOSIS — D638 Anemia in other chronic diseases classified elsewhere: Secondary | ICD-10-CM | POA: Diagnosis not present

## 2021-02-11 DIAGNOSIS — M549 Dorsalgia, unspecified: Secondary | ICD-10-CM | POA: Diagnosis not present

## 2021-02-11 DIAGNOSIS — R531 Weakness: Secondary | ICD-10-CM

## 2021-02-11 DIAGNOSIS — M25561 Pain in right knee: Secondary | ICD-10-CM | POA: Diagnosis not present

## 2021-02-11 DIAGNOSIS — D62 Acute posthemorrhagic anemia: Secondary | ICD-10-CM | POA: Diagnosis not present

## 2021-02-11 DIAGNOSIS — L8990 Pressure ulcer of unspecified site, unspecified stage: Secondary | ICD-10-CM | POA: Diagnosis present

## 2021-02-11 DIAGNOSIS — S80919A Unspecified superficial injury of unspecified knee, initial encounter: Secondary | ICD-10-CM | POA: Diagnosis not present

## 2021-02-11 DIAGNOSIS — R404 Transient alteration of awareness: Secondary | ICD-10-CM | POA: Diagnosis not present

## 2021-02-11 LAB — CBC WITH DIFFERENTIAL/PLATELET
Abs Immature Granulocytes: 0.02 10*3/uL (ref 0.00–0.07)
Basophils Absolute: 0 10*3/uL (ref 0.0–0.1)
Basophils Relative: 0 %
Eosinophils Absolute: 0.1 10*3/uL (ref 0.0–0.5)
Eosinophils Relative: 1 %
HCT: 31.9 % — ABNORMAL LOW (ref 36.0–46.0)
Hemoglobin: 10 g/dL — ABNORMAL LOW (ref 12.0–15.0)
Immature Granulocytes: 0 %
Lymphocytes Relative: 11 %
Lymphs Abs: 0.8 10*3/uL (ref 0.7–4.0)
MCH: 31.3 pg (ref 26.0–34.0)
MCHC: 31.3 g/dL (ref 30.0–36.0)
MCV: 99.7 fL (ref 80.0–100.0)
Monocytes Absolute: 0.6 10*3/uL (ref 0.1–1.0)
Monocytes Relative: 8 %
Neutro Abs: 6.2 10*3/uL (ref 1.7–7.7)
Neutrophils Relative %: 80 %
Platelets: 134 10*3/uL — ABNORMAL LOW (ref 150–400)
RBC: 3.2 MIL/uL — ABNORMAL LOW (ref 3.87–5.11)
RDW: 13.8 % (ref 11.5–15.5)
WBC: 7.8 10*3/uL (ref 4.0–10.5)
nRBC: 0 % (ref 0.0–0.2)

## 2021-02-11 LAB — BASIC METABOLIC PANEL
Anion gap: 6 (ref 5–15)
BUN: 30 mg/dL — ABNORMAL HIGH (ref 8–23)
CO2: 30 mmol/L (ref 22–32)
Calcium: 8.7 mg/dL — ABNORMAL LOW (ref 8.9–10.3)
Chloride: 106 mmol/L (ref 98–111)
Creatinine, Ser: 0.68 mg/dL (ref 0.44–1.00)
GFR, Estimated: >60 mL/min (ref >60)
Glucose, Bld: 118 mg/dL — ABNORMAL HIGH (ref 70–99)
Potassium: 4.2 mmol/L (ref 3.5–5.1)
Sodium: 142 mmol/L (ref 135–145)

## 2021-02-11 LAB — PROTIME-INR
INR: 1 (ref 0.8–1.2)
Prothrombin Time: 12.8 seconds (ref 11.4–15.2)

## 2021-02-11 MED ORDER — FENTANYL CITRATE PF 50 MCG/ML IJ SOSY
50.0000 ug | PREFILLED_SYRINGE | INTRAMUSCULAR | Status: DC | PRN
  Administered 2021-02-12: 50 ug via INTRAVENOUS
  Filled 2021-02-11: qty 1

## 2021-02-11 NOTE — ED Provider Notes (Signed)
  Provider Note MRN:  [id number]  Arrival date & time: 02/12/21    ED Course and Medical Decision Making  Assumed care from Dr. Rhunette Croft at shift change.  Fall, mechanical, suspect hip fracture, awaiting labs and x-ray.  X-ray confirms fracture.  Dr. Yevette Edwards of orthopedics will follow, admitted to medicine for further care.  Procedures  Final Clinical Impressions(s) / ED Diagnoses     ICD-10-CM   1. Closed nondisplaced intertrochanteric fracture of left femur, initial encounter [facility])  W09.811B       ED Discharge Orders     None       Discharge Instructions   None     Elmer Sow. Pilar Plate, MD [facility] [email or web address]    Sabas Sous, MD 02/12/21 [phone]

## 2021-02-11 NOTE — ED Triage Notes (Signed)
Patient BIB [facility] from [facility] nursing home after a fall. Patient slipped out of her bed. Complaining of right leg pain, posterior, in the crease of the knee. Bruising to lower back and buttocks. Patient has dementia. Alert and and oriented x2. Patient not on blood thinners.   EMS vitals HR 84 126/70 CBG 120

## 2021-02-11 NOTE — ED Provider Notes (Signed)
[facility]-EMERGENCY DEPT Provider Note   CSN: [id number] Arrival date & time: 02/11/21  2141     History Chief Complaint  Patient presents with   Rita Torres    MASHELL Torres is a 87 y.o. female.  HPI    87 year old female comes in with chief complaint of fall.  Patient resides at [facility] and had an unwitnessed fall.  Patient has no complaints from her side.  Level 5 caveat for severe dementia.  Past Medical History:  Diagnosis Date   Dementia (HCC)    Dyspnea on exertion    Grief reaction    Memory loss    Osteopenia 04/2015   t score -1.8    Patient Active Problem List   Diagnosis Date Noted   Fracture of femoral neck, right, closed (HCC) 02/12/2021   Dementia with behavioral disturbance 02/12/2021   Generalized weakness 02/12/2021   Goals of care, counseling/discussion 02/12/2021   Pressure injury of skin 02/12/2021   Mild cognitive impairment 09/14/2016   OAB (overactive bladder) 11/16/2013   Osteopenia     Past Surgical History:  Procedure Laterality Date   ABDOMINAL HYSTERECTOMY     BSO   OOPHORECTOMY     BSO     OB History     Gravida  2   Para  2   Term  2   Preterm      AB      Living  2      SAB      IAB      Ectopic      Multiple      Live Births              Family History  Problem Relation Age of Onset   Heart disease Father     Social History   Tobacco Use   Smoking status: Never   Smokeless tobacco: Never  Vaping Use   Vaping Use: Never used  Substance Use Topics   Alcohol use: No    Alcohol/week: 0.0 standard drinks   Drug use: No    Home Medications Prior to Admission medications   Medication Sig Start Date End Date Taking? Authorizing Provider  acetaminophen (TYLENOL) 325 MG tablet Take 650 mg by mouth 2 (two) times daily as needed for moderate pain or mild pain. Use caution with APAP, total daily dose greater than 3000 MG   Yes [provider]   acetaminophen (TYLENOL) 500 MG tablet Take 1 tablet (500 mg total) by mouth every 6 (six) hours as needed for mild pain or moderate pain. 02/15/21  Yes Gawne, Meghan M, PA-C  citalopram (CELEXA) 10 MG tablet Take 1 tablet (10 mg total) by mouth daily. 02/26/20  Yes Van Clines, MD  divalproex (DEPAKOTE ER) 250 MG 24 hr tablet TAKE 1 TABLET BY MOUTH EVERY DAY AT NIGHT Patient taking differently: Take 250 mg by mouth at bedtime. 02/26/20  Yes Van Clines, MD  donepezil (ARICEPT) 10 MG tablet Take 1 tablet every night Patient taking differently: Take 10 mg by mouth daily. Take 1 tablet every night 02/26/20  Yes Van Clines, MD  loperamide (IMODIUM) 2 MG capsule Take 2-4 mg by mouth See admin instructions. Take 2 tablets by mouth after initial loose stool, then 1 tablet after each additional. Do not exceed 4 tablets in 24 hours.   Yes [provider]  memantine (NAMENDA) 10 MG tablet 1 TABLET TWICE A DAY Patient taking differently:  Take 10 mg by mouth 2 (two) times daily. 02/26/20  Yes Van Clines, MD  rivaroxaban (XARELTO) 10 MG TABS tablet Take 1 tablet (10 mg total) by mouth daily. For DVT prophylaxis after surgery. 02/15/21  Yes Gawne, Meghan M, PA-C  acetaminophen (TYLENOL) 325 MG tablet Take 650 mg by mouth in the morning and at bedtime. Patient not taking: No sig reported    [provider]  cholecalciferol (VITAMIN D) 25 MCG tablet Take 1 tablet (1,000 Units total) by mouth daily. 02/17/21   [place], Alvira Philips, DO  docusate sodium (COLACE) 100 MG capsule Take 1 capsule (100 mg total) by mouth 2 (two) times daily. 02/14/21   Kathlen Mody, MD  feeding supplement (ENSURE SURGERY) LIQD Take 237 mLs by mouth 2 (two) times daily between meals. 02/14/21   Kathlen Mody, MD  Multiple Vitamin (MULTIVITAMIN WITH MINERALS) TABS tablet Take 1 tablet by mouth daily. 02/15/21   Kathlen Mody, MD  polyethylene glycol (MIRALAX / GLYCOLAX) 17 g packet Take 17 g by mouth daily as  needed for mild constipation. 02/14/21   Kathlen Mody, MD  senna (SENOKOT) 8.6 MG TABS tablet Take 1 tablet (8.6 mg total) by mouth 2 (two) times daily. 02/14/21   Kathlen Mody, MD    Allergies    Penicillins  Review of Systems   Review of Systems  Unable to perform ROS: Dementia   Physical Exam Updated Vital Signs BP 121/60 (BP Location: Left Arm)   Pulse 71   Temp 98 F (36.7 C) (Oral)   Resp 16   Ht 5\' 5"  (1.651 m)   Wt 54.9 kg   SpO2 99%   BMI 20.14 kg/m   Physical Exam Vitals and nursing note reviewed.  Constitutional:      Appearance: She is well-developed.  HENT:     Head: Normocephalic and atraumatic.  Eyes:     Extraocular Movements: Extraocular movements intact.     Pupils: Pupils are equal, round, and reactive to light.  Neck:     Comments: No midline c-spine tenderness Cardiovascular:     Rate and Rhythm: Normal rate and regular rhythm.     Heart sounds: No murmur heard. Pulmonary:     Effort: Pulmonary effort is normal. No respiratory distress.     Breath sounds: Normal breath sounds.  Chest:     Chest wall: No tenderness.  Abdominal:     General: Bowel sounds are normal. There is no distension.     Palpations: Abdomen is soft.     Tenderness: There is no abdominal tenderness.  Musculoskeletal:        General: Tenderness present. No deformity.     Cervical back: Neck supple. No tenderness.     Comments: Right hip tenderness  Skin:    General: Skin is warm and dry.     Findings: No rash.  Neurological:     Mental Status: She is alert and oriented to person, place, and time.     Cranial Nerves: No cranial nerve deficit.    ED Results / Procedures / Treatments   Labs (all labs ordered are listed, but only abnormal results are displayed) Labs Reviewed  URINE CULTURE - Abnormal; Notable for the following components:      Result Value   Culture MULTIPLE SPECIES PRESENT, SUGGEST RECOLLECTION (*)    All other components within normal limits   BASIC METABOLIC PANEL - Abnormal; Notable for the following components:   Glucose, Bld 118 (*)  BUN 30 (*)    Calcium 8.7 (*)    All other components within normal limits  CBC WITH DIFFERENTIAL/PLATELET - Abnormal; Notable for the following components:   RBC 3.20 (*)    Hemoglobin 10.0 (*)    HCT 31.9 (*)    Platelets 134 (*)    All other components within normal limits  URINALYSIS, COMPLETE (UACMP) WITH MICROSCOPIC - Abnormal; Notable for the following components:   APPearance HAZY (*)    Bacteria, UA RARE (*)    All other components within normal limits  CBC WITH DIFFERENTIAL/PLATELET - Abnormal; Notable for the following components:   RBC 3.07 (*)    Hemoglobin 9.4 (*)    HCT 30.3 (*)    Platelets 133 (*)    All other components within normal limits  COMPREHENSIVE METABOLIC PANEL - Abnormal; Notable for the following components:   Glucose, Bld 107 (*)    BUN 26 (*)    Calcium 8.6 (*)    Total Protein 5.6 (*)    Albumin 2.9 (*)    Alkaline Phosphatase 147 (*)    Anion gap 4 (*)    All other components within normal limits  VITAMIN D 25 HYDROXY (VIT D DEFICIENCY, FRACTURES) - Abnormal; Notable for the following components:   Vit D, 25-Hydroxy 25.80 (*)    All other components within normal limits  CBC - Abnormal; Notable for the following components:   RBC 2.96 (*)    Hemoglobin 9.0 (*)    HCT 29.3 (*)    Platelets 121 (*)    All other components within normal limits  BASIC METABOLIC PANEL - Abnormal; Notable for the following components:   Glucose, Bld 122 (*)    Calcium 8.3 (*)    All other components within normal limits  CBC - Abnormal; Notable for the following components:   RBC 3.11 (*)    Hemoglobin 9.7 (*)    HCT 31.2 (*)    MCV 100.3 (*)    Platelets 119 (*)    All other components within normal limits  BASIC METABOLIC PANEL - Abnormal; Notable for the following components:   Glucose, Bld 100 (*)    Calcium 8.6 (*)    All other components within  normal limits  CBC - Abnormal; Notable for the following components:   RBC 3.05 (*)    Hemoglobin 9.3 (*)    HCT 30.7 (*)    MCV 100.7 (*)    Platelets 123 (*)    All other components within normal limits  TSH - Abnormal; Notable for the following components:   TSH 4.702 (*)    All other components within normal limits  CBC - Abnormal; Notable for the following components:   RBC 3.46 (*)    Hemoglobin 10.5 (*)    HCT 34.8 (*)    MCV 100.6 (*)    Platelets 141 (*)    All other components within normal limits  COMPREHENSIVE METABOLIC PANEL - Abnormal; Notable for the following components:   BUN 24 (*)    Calcium 8.8 (*)    Total Protein 6.1 (*)    Albumin 3.1 (*)    Alkaline Phosphatase 156 (*)    All other components within normal limits  MAGNESIUM - Abnormal; Notable for the following components:   Magnesium 2.5 (*)    All other components within normal limits  VALPROIC ACID LEVEL - Abnormal; Notable for the following components:   Valproic Acid Lvl <10 (*)  All other components within normal limits  RESP PANEL BY RT-PCR (FLU A&B, COVID) ARPGX2  SURGICAL PCR SCREEN  RESP PANEL BY RT-PCR (FLU A&B, COVID) ARPGX2  PROTIME-INR  APTT  PROTIME-INR  VITAMIN B12  AMMONIA  TYPE AND SCREEN  ABO/RH    EKG EKG Interpretation  Date/Time:  /09/22 1401    docusate sodium (COLACE) 100 MG capsule  2 times  daily        02/14/21 1218    feeding supplement (ENSURE SURGERY) LIQD  2 times daily between meals        02/14/21 1218    polyethylene glycol (MIRALAX / GLYCOLAX) 17 g packet  Daily PRN        02/14/21 1218    senna (SENOKOT) 8.6 MG TABS tablet  2 times daily        02/14/21 1218    Diet - low sodium heart healthy        02/14/21 1220    Discharge instructions       Comments: Please follow up with orthopedics in 2 week.   02/14/21 1220    Discharge wound care:       Comments: Reinforce dressing.   02/14/21 1220             Derwood Kaplan, MD 02/20/21 1339

## 2021-02-12 ENCOUNTER — Encounter (HOSPITAL_COMMUNITY)
Admission: EM | Disposition: A | Discharge status: Skilled Nursing Facility | Payer: Self-pay | Source: Skilled Nursing Facility | Attending: Internal Medicine

## 2021-02-12 ENCOUNTER — Inpatient Hospital Stay (HOSPITAL_COMMUNITY): Payer: Medicare Other

## 2021-02-12 ENCOUNTER — Encounter (HOSPITAL_COMMUNITY): Payer: Self-pay | Admitting: Internal Medicine

## 2021-02-12 ENCOUNTER — Inpatient Hospital Stay (HOSPITAL_COMMUNITY): Payer: Medicare Other | Admitting: Certified Registered Nurse Anesthetist

## 2021-02-12 DIAGNOSIS — R531 Weakness: Secondary | ICD-10-CM | POA: Diagnosis present

## 2021-02-12 DIAGNOSIS — S72001A Fracture of unspecified part of neck of right femur, initial encounter for closed fracture: Secondary | ICD-10-CM | POA: Diagnosis present

## 2021-02-12 DIAGNOSIS — R400 Somnolence: Secondary | ICD-10-CM | POA: Diagnosis present

## 2021-02-12 DIAGNOSIS — L89891 Pressure ulcer of other site, stage 1: Secondary | ICD-10-CM | POA: Diagnosis present

## 2021-02-12 DIAGNOSIS — Z7189 Other specified counseling: Secondary | ICD-10-CM

## 2021-02-12 DIAGNOSIS — Y92122 Bedroom in nursing home as the place of occurrence of the external cause: Secondary | ICD-10-CM | POA: Diagnosis not present

## 2021-02-12 DIAGNOSIS — E559 Vitamin D deficiency, unspecified: Secondary | ICD-10-CM | POA: Diagnosis present

## 2021-02-12 DIAGNOSIS — F32A Depression, unspecified: Secondary | ICD-10-CM | POA: Diagnosis present

## 2021-02-12 DIAGNOSIS — M810 Age-related osteoporosis without current pathological fracture: Secondary | ICD-10-CM | POA: Diagnosis present

## 2021-02-12 DIAGNOSIS — S72011A Unspecified intracapsular fracture of right femur, initial encounter for closed fracture: Secondary | ICD-10-CM | POA: Diagnosis present

## 2021-02-12 DIAGNOSIS — S72001D Fracture of unspecified part of neck of right femur, subsequent encounter for closed fracture with routine healing: Secondary | ICD-10-CM

## 2021-02-12 DIAGNOSIS — Z90710 Acquired absence of both cervix and uterus: Secondary | ICD-10-CM | POA: Diagnosis not present

## 2021-02-12 DIAGNOSIS — F03918 Unspecified dementia, unspecified severity, with other behavioral disturbance: Secondary | ICD-10-CM

## 2021-02-12 DIAGNOSIS — F0393 Unspecified dementia, unspecified severity, with mood disturbance: Secondary | ICD-10-CM | POA: Diagnosis present

## 2021-02-12 DIAGNOSIS — Z79899 Other long term (current) drug therapy: Secondary | ICD-10-CM | POA: Diagnosis not present

## 2021-02-12 DIAGNOSIS — Z20822 Contact with and (suspected) exposure to covid-19: Secondary | ICD-10-CM | POA: Diagnosis present

## 2021-02-12 DIAGNOSIS — W06XXXA Fall from bed, initial encounter: Secondary | ICD-10-CM | POA: Diagnosis present

## 2021-02-12 DIAGNOSIS — Z90722 Acquired absence of ovaries, bilateral: Secondary | ICD-10-CM | POA: Diagnosis not present

## 2021-02-12 DIAGNOSIS — Z8249 Family history of ischemic heart disease and other diseases of the circulatory system: Secondary | ICD-10-CM | POA: Diagnosis not present

## 2021-02-12 DIAGNOSIS — Z880 Allergy status to penicillin: Secondary | ICD-10-CM | POA: Diagnosis not present

## 2021-02-12 DIAGNOSIS — G9341 Metabolic encephalopathy: Secondary | ICD-10-CM | POA: Diagnosis not present

## 2021-02-12 DIAGNOSIS — D62 Acute posthemorrhagic anemia: Secondary | ICD-10-CM | POA: Diagnosis not present

## 2021-02-12 DIAGNOSIS — L8990 Pressure ulcer of unspecified site, unspecified stage: Secondary | ICD-10-CM | POA: Diagnosis present

## 2021-02-12 DIAGNOSIS — D638 Anemia in other chronic diseases classified elsewhere: Secondary | ICD-10-CM | POA: Diagnosis present

## 2021-02-12 DIAGNOSIS — F419 Anxiety disorder, unspecified: Secondary | ICD-10-CM | POA: Diagnosis present

## 2021-02-12 DIAGNOSIS — F0394 Unspecified dementia, unspecified severity, with anxiety: Secondary | ICD-10-CM | POA: Diagnosis present

## 2021-02-12 DIAGNOSIS — Z751 Person awaiting admission to adequate facility elsewhere: Secondary | ICD-10-CM | POA: Diagnosis not present

## 2021-02-12 DIAGNOSIS — Z66 Do not resuscitate: Secondary | ICD-10-CM | POA: Diagnosis present

## 2021-02-12 DIAGNOSIS — D696 Thrombocytopenia, unspecified: Secondary | ICD-10-CM | POA: Diagnosis not present

## 2021-02-12 HISTORY — PX: HIP PINNING,CANNULATED: SHX1758

## 2021-02-12 LAB — CBC WITH DIFFERENTIAL/PLATELET
Abs Immature Granulocytes: 0.01 10*3/uL (ref 0.00–0.07)
Basophils Absolute: 0 10*3/uL (ref 0.0–0.1)
Basophils Relative: 0 %
Eosinophils Absolute: 0.2 10*3/uL (ref 0.0–0.5)
Eosinophils Relative: 3 %
HCT: 30.3 % — ABNORMAL LOW (ref 36.0–46.0)
Hemoglobin: 9.4 g/dL — ABNORMAL LOW (ref 12.0–15.0)
Immature Granulocytes: 0 %
Lymphocytes Relative: 22 %
Lymphs Abs: 1.1 10*3/uL (ref 0.7–4.0)
MCH: 30.6 pg (ref 26.0–34.0)
MCHC: 31 g/dL (ref 30.0–36.0)
MCV: 98.7 fL (ref 80.0–100.0)
Monocytes Absolute: 0.6 10*3/uL (ref 0.1–1.0)
Monocytes Relative: 11 %
Neutro Abs: 3.3 10*3/uL (ref 1.7–7.7)
Neutrophils Relative %: 64 %
Platelets: 133 10*3/uL — ABNORMAL LOW (ref 150–400)
RBC: 3.07 MIL/uL — ABNORMAL LOW (ref 3.87–5.11)
RDW: 13.8 % (ref 11.5–15.5)
WBC: 5.2 10*3/uL (ref 4.0–10.5)
nRBC: 0 % (ref 0.0–0.2)

## 2021-02-12 LAB — URINALYSIS, COMPLETE (UACMP) WITH MICROSCOPIC
Bilirubin Urine: NEGATIVE
Glucose, UA: NEGATIVE mg/dL
Hgb urine dipstick: NEGATIVE
Ketones, ur: NEGATIVE mg/dL
Leukocytes,Ua: NEGATIVE
Nitrite: NEGATIVE
Protein, ur: NEGATIVE mg/dL
Specific Gravity, Urine: 1.018 (ref 1.005–1.030)
pH: 7 (ref 5.0–8.0)

## 2021-02-12 LAB — COMPREHENSIVE METABOLIC PANEL
ALT: 14 U/L (ref 0–44)
AST: 19 U/L (ref 15–41)
Albumin: 2.9 g/dL — ABNORMAL LOW (ref 3.5–5.0)
Alkaline Phosphatase: 147 U/L — ABNORMAL HIGH (ref 38–126)
Anion gap: 4 — ABNORMAL LOW (ref 5–15)
BUN: 26 mg/dL — ABNORMAL HIGH (ref 8–23)
CO2: 28 mmol/L (ref 22–32)
Calcium: 8.6 mg/dL — ABNORMAL LOW (ref 8.9–10.3)
Chloride: 108 mmol/L (ref 98–111)
Creatinine, Ser: 0.56 mg/dL (ref 0.44–1.00)
GFR, Estimated: >60 mL/min (ref >60)
Glucose, Bld: 107 mg/dL — ABNORMAL HIGH (ref 70–99)
Potassium: 3.9 mmol/L (ref 3.5–5.1)
Sodium: 140 mmol/L (ref 135–145)
Total Bilirubin: 0.6 mg/dL (ref 0.3–1.2)
Total Protein: 5.6 g/dL — ABNORMAL LOW (ref 6.5–8.1)

## 2021-02-12 LAB — PROTIME-INR
INR: 1 (ref 0.8–1.2)
Prothrombin Time: 13 seconds (ref 11.4–15.2)

## 2021-02-12 LAB — ABO/RH: ABO/RH(D): A POS

## 2021-02-12 LAB — TYPE AND SCREEN
ABO/RH(D): A POS
Antibody Screen: NEGATIVE

## 2021-02-12 LAB — RESP PANEL BY RT-PCR (FLU A&B, COVID) ARPGX2
Influenza A by PCR: NEGATIVE
Influenza B by PCR: NEGATIVE
SARS Coronavirus 2 by RT PCR: NEGATIVE

## 2021-02-12 LAB — APTT: aPTT: 30 seconds (ref 24–36)

## 2021-02-12 LAB — SURGICAL PCR SCREEN
MRSA, PCR: NEGATIVE
Staphylococcus aureus: NEGATIVE

## 2021-02-12 LAB — VITAMIN D 25 HYDROXY (VIT D DEFICIENCY, FRACTURES): Vit D, 25-Hydroxy: 25.8 ng/mL — ABNORMAL LOW (ref 30–100)

## 2021-02-12 SURGERY — FIXATION, FEMUR, NECK, PERCUTANEOUS, USING SCREW
Anesthesia: Spinal | Site: Hip | Laterality: Right

## 2021-02-12 MED ORDER — MORPHINE SULFATE (PF) 2 MG/ML IV SOLN
0.5000 mg | INTRAVENOUS | Status: DC | PRN
  Administered 2021-02-12: 1 mg via INTRAVENOUS
  Filled 2021-02-12: qty 1

## 2021-02-12 MED ORDER — PROPOFOL 500 MG/50ML IV EMUL
INTRAVENOUS | Status: DC | PRN
  Administered 2021-02-12: 50 ug/kg/min via INTRAVENOUS

## 2021-02-12 MED ORDER — SENNA 8.6 MG PO TABS
1.0000 | ORAL_TABLET | Freq: Two times a day (BID) | ORAL | Status: DC
  Administered 2021-02-13 – 2021-02-17 (×9): 8.6 mg via ORAL
  Filled 2021-02-12 (×9): qty 1

## 2021-02-12 MED ORDER — TRANEXAMIC ACID-NACL 1000-0.7 MG/100ML-% IV SOLN
1000.0000 mg | Freq: Once | INTRAVENOUS | Status: AC
  Administered 2021-02-12: 1000 mg via INTRAVENOUS
  Filled 2021-02-12: qty 100

## 2021-02-12 MED ORDER — FENTANYL CITRATE (PF) 100 MCG/2ML IJ SOLN
INTRAMUSCULAR | Status: AC
  Filled 2021-02-12: qty 2

## 2021-02-12 MED ORDER — PROPOFOL 10 MG/ML IV BOLUS
INTRAVENOUS | Status: AC
  Filled 2021-02-12: qty 20

## 2021-02-12 MED ORDER — CEFAZOLIN SODIUM-DEXTROSE 2-4 GM/100ML-% IV SOLN
2.0000 g | INTRAVENOUS | Status: AC
  Administered 2021-02-12: 2 g via INTRAVENOUS

## 2021-02-12 MED ORDER — ONDANSETRON HCL 4 MG/2ML IJ SOLN: 4.0000 mg | Freq: Four times a day (QID) | INTRAMUSCULAR | Status: DC | PRN

## 2021-02-12 MED ORDER — CITALOPRAM HYDROBROMIDE 20 MG PO TABS
10.0000 mg | ORAL_TABLET | Freq: Every day | ORAL | Status: DC
  Administered 2021-02-12 – 2021-02-17 (×5): 10 mg via ORAL
  Filled 2021-02-12 (×5): qty 1

## 2021-02-12 MED ORDER — ONDANSETRON HCL 4 MG PO TABS: 4.0000 mg | ORAL_TABLET | Freq: Four times a day (QID) | ORAL | Status: DC | PRN

## 2021-02-12 MED ORDER — BUPIVACAINE HCL 0.25 % IJ SOLN
INTRAMUSCULAR | Status: AC
  Filled 2021-02-12: qty 1

## 2021-02-12 MED ORDER — POVIDONE-IODINE 10 % EX SWAB: 2.0000 "application " | Freq: Once | CUTANEOUS | Status: DC

## 2021-02-12 MED ORDER — MEMANTINE HCL 10 MG PO TABS
10.0000 mg | ORAL_TABLET | Freq: Two times a day (BID) | ORAL | Status: DC
  Administered 2021-02-12 – 2021-02-17 (×10): 10 mg via ORAL
  Filled 2021-02-12 (×3): qty 1
  Filled 2021-02-12: qty 2
  Filled 2021-02-12 (×6): qty 1
  Filled 2021-02-12 (×2): qty 2

## 2021-02-12 MED ORDER — ACETAMINOPHEN 325 MG PO TABS
325.0000 mg | ORAL_TABLET | Freq: Four times a day (QID) | ORAL | Status: DC | PRN
  Administered 2021-02-13 – 2021-02-17 (×3): 650 mg via ORAL
  Filled 2021-02-12 (×3): qty 2

## 2021-02-12 MED ORDER — BUPIVACAINE IN DEXTROSE 0.75-8.25 % IT SOLN
INTRATHECAL | Status: DC | PRN
  Administered 2021-02-12: 1.6 mL via INTRATHECAL

## 2021-02-12 MED ORDER — CEFAZOLIN SODIUM-DEXTROSE 2-4 GM/100ML-% IV SOLN
2.0000 g | Freq: Four times a day (QID) | INTRAVENOUS | Status: AC
  Administered 2021-02-12 – 2021-02-13 (×2): 2 g via INTRAVENOUS
  Filled 2021-02-12 (×2): qty 100

## 2021-02-12 MED ORDER — ALUM & MAG HYDROXIDE-SIMETH 200-200-20 MG/5ML PO SUSP: 30.0000 mL | ORAL | Status: DC | PRN

## 2021-02-12 MED ORDER — 0.9 % SODIUM CHLORIDE (POUR BTL) OPTIME
TOPICAL | Status: DC | PRN
  Administered 2021-02-12: 1000 mL

## 2021-02-12 MED ORDER — MENTHOL 3 MG MT LOZG: 1.0000 | LOZENGE | OROMUCOSAL | Status: DC | PRN

## 2021-02-12 MED ORDER — ACETAMINOPHEN 500 MG PO TABS
500.0000 mg | ORAL_TABLET | Freq: Four times a day (QID) | ORAL | Status: AC
  Administered 2021-02-12 – 2021-02-13 (×4): 500 mg via ORAL
  Filled 2021-02-12 (×4): qty 1

## 2021-02-12 MED ORDER — FENTANYL CITRATE PF 50 MCG/ML IJ SOSY: 12.5000 ug | PREFILLED_SYRINGE | INTRAMUSCULAR | Status: DC | PRN

## 2021-02-12 MED ORDER — ADULT MULTIVITAMIN W/MINERALS CH
1.0000 | ORAL_TABLET | Freq: Every day | ORAL | Status: DC
  Administered 2021-02-13 – 2021-02-17 (×4): 1 via ORAL
  Filled 2021-02-12 (×4): qty 1

## 2021-02-12 MED ORDER — TRANEXAMIC ACID-NACL 1000-0.7 MG/100ML-% IV SOLN
1000.0000 mg | INTRAVENOUS | Status: AC
  Administered 2021-02-12: 1000 mg via INTRAVENOUS
  Filled 2021-02-12: qty 100

## 2021-02-12 MED ORDER — FENTANYL CITRATE PF 50 MCG/ML IJ SOSY: 25.0000 ug | PREFILLED_SYRINGE | INTRAMUSCULAR | Status: DC | PRN

## 2021-02-12 MED ORDER — ACETAMINOPHEN 325 MG PO TABS
650.0000 mg | ORAL_TABLET | Freq: Two times a day (BID) | ORAL | Status: DC
  Administered 2021-02-12: 650 mg via ORAL
  Filled 2021-02-12: qty 2

## 2021-02-12 MED ORDER — DIVALPROEX SODIUM ER 250 MG PO TB24
250.0000 mg | ORAL_TABLET | Freq: Every day | ORAL | Status: DC
  Administered 2021-02-12 – 2021-02-17 (×5): 250 mg via ORAL
  Filled 2021-02-12 (×6): qty 1

## 2021-02-12 MED ORDER — CHLORHEXIDINE GLUCONATE 4 % EX LIQD: 60.0000 mL | Freq: Once | CUTANEOUS | Status: DC

## 2021-02-12 MED ORDER — ENSURE SURGERY PO LIQD
237.0000 mL | Freq: Two times a day (BID) | ORAL | Status: DC
  Administered 2021-02-13 – 2021-02-17 (×6): 237 mL via ORAL

## 2021-02-12 MED ORDER — CEFAZOLIN SODIUM-DEXTROSE 2-4 GM/100ML-% IV SOLN
INTRAVENOUS | Status: AC
  Filled 2021-02-12: qty 100

## 2021-02-12 MED ORDER — MIRTAZAPINE 15 MG PO TABS
7.5000 mg | ORAL_TABLET | Freq: Every day | ORAL | Status: DC
  Administered 2021-02-13 – 2021-02-14 (×3): 7.5 mg via ORAL
  Filled 2021-02-12 (×5): qty 1

## 2021-02-12 MED ORDER — BISACODYL 10 MG RE SUPP: 10.0000 mg | Freq: Every day | RECTAL | Status: DC | PRN

## 2021-02-12 MED ORDER — DOCUSATE SODIUM 100 MG PO CAPS
100.0000 mg | ORAL_CAPSULE | Freq: Two times a day (BID) | ORAL | Status: DC
  Administered 2021-02-13 – 2021-02-17 (×8): 100 mg via ORAL
  Filled 2021-02-12 (×9): qty 1

## 2021-02-12 MED ORDER — RIVAROXABAN 10 MG PO TABS
10.0000 mg | ORAL_TABLET | Freq: Every day | ORAL | Status: DC
  Administered 2021-02-13 – 2021-02-17 (×4): 10 mg via ORAL
  Filled 2021-02-12 (×4): qty 1

## 2021-02-12 MED ORDER — PHENYLEPHRINE HCL (PRESSORS) 10 MG/ML IV SOLN
INTRAVENOUS | Status: DC | PRN
  Administered 2021-02-12 (×2): 80 ug via INTRAVENOUS

## 2021-02-12 MED ORDER — FLEET ENEMA 7-19 GM/118ML RE ENEM: 1.0000 | ENEMA | Freq: Once | RECTAL | Status: DC | PRN

## 2021-02-12 MED ORDER — POLYETHYLENE GLYCOL 3350 17 G PO PACK: 17.0000 g | PACK | Freq: Every day | ORAL | Status: DC | PRN

## 2021-02-12 MED ORDER — PHENOL 1.4 % MT LIQD: 1.0000 | OROMUCOSAL | Status: DC | PRN

## 2021-02-12 MED ORDER — FENTANYL CITRATE (PF) 100 MCG/2ML IJ SOLN
INTRAMUSCULAR | Status: DC | PRN
  Administered 2021-02-12: 25 ug via INTRAVENOUS

## 2021-02-12 MED ORDER — CHLORHEXIDINE GLUCONATE CLOTH 2 % EX PADS: 6.0000 | MEDICATED_PAD | Freq: Every day | CUTANEOUS | Status: DC

## 2021-02-12 MED ORDER — DONEPEZIL HCL 10 MG PO TABS
10.0000 mg | ORAL_TABLET | Freq: Every day | ORAL | Status: DC
  Administered 2021-02-13 – 2021-02-16 (×5): 10 mg via ORAL
  Filled 2021-02-12 (×3): qty 1
  Filled 2021-02-12 (×2): qty 2
  Filled 2021-02-12 (×2): qty 1

## 2021-02-12 MED ORDER — LACTATED RINGERS IV SOLN: INTRAVENOUS | Status: AC

## 2021-02-12 MED ORDER — HYDROCODONE-ACETAMINOPHEN 5-325 MG PO TABS
1.0000 | ORAL_TABLET | ORAL | Status: DC | PRN
  Administered 2021-02-13 (×3): 1 via ORAL
  Filled 2021-02-12 (×3): qty 1

## 2021-02-12 MED ORDER — ONDANSETRON HCL 4 MG/2ML IJ SOLN
INTRAMUSCULAR | Status: DC | PRN
  Administered 2021-02-12: 4 mg via INTRAVENOUS

## 2021-02-12 SURGICAL SUPPLY — 38 items
BAG COUNTER SPONGE SURGICOUNT (BAG) ×2 IMPLANT
BAG SPNG CNTER NS LX DISP (BAG) ×1
BIT DRILL 4.9 CANNULATED (BIT) ×1
BIT DRILL CANN QC 4.9 LRG (BIT) IMPLANT
BNDG COHESIVE 4X5 TAN ST LF (GAUZE/BANDAGES/DRESSINGS) ×2 IMPLANT
BNDG GAUZE ELAST 4 BULKY (GAUZE/BANDAGES/DRESSINGS) IMPLANT
COVER PERINEAL POST (MISCELLANEOUS) ×2 IMPLANT
COVER SURGICAL LIGHT HANDLE (MISCELLANEOUS) ×2 IMPLANT
DRAPE STERI IOBAN 125X83 (DRAPES) ×2 IMPLANT
DRESSING MEPILEX FLEX 4X4 (GAUZE/BANDAGES/DRESSINGS) ×1 IMPLANT
DRILL BIT CANNULATED 4.9 (BIT) ×2
DRSG MEPILEX FLEX 4X4 (GAUZE/BANDAGES/DRESSINGS) ×2
DURAPREP 26ML APPLICATOR (WOUND CARE) ×2 IMPLANT
ELECT REM PT RETURN 15FT ADLT (MISCELLANEOUS) ×2 IMPLANT
GLOVE SRG 8 PF TXTR STRL LF DI (GLOVE) ×1 IMPLANT
GLOVE SURG ENC MOIS LTX SZ7.5 (GLOVE) ×2 IMPLANT
GLOVE SURG POLYISO LF SZ7.5 (GLOVE) ×2 IMPLANT
GLOVE SURG UNDER POLY LF SZ7.5 (GLOVE) ×2 IMPLANT
GLOVE SURG UNDER POLY LF SZ8 (GLOVE) ×2
GOWN STRL REUS W/TWL LRG LVL3 (GOWN DISPOSABLE) ×2 IMPLANT
GOWN STRL REUS W/TWL XL LVL3 (GOWN DISPOSABLE) ×2 IMPLANT
GUIDEWIRE THRD ASNIS 3.2X300 (WIRE) ×3 IMPLANT
KIT BASIN OR (CUSTOM PROCEDURE TRAY) ×2 IMPLANT
KIT TURNOVER KIT A (KITS) IMPLANT
MANIFOLD NEPTUNE II (INSTRUMENTS) ×2 IMPLANT
NS IRRIG 1000ML POUR BTL (IV SOLUTION) ×2 IMPLANT
PACK GENERAL/GYN (CUSTOM PROCEDURE TRAY) ×2 IMPLANT
PAD ARMBOARD 7.5X6 YLW CONV (MISCELLANEOUS) ×4 IMPLANT
SCREW ASNIS 85MM (Screw) ×1 IMPLANT
SCREW ASNIS 90MM (Screw) ×1 IMPLANT
SCREW CANN 6.5X80 STRL (Screw) ×2 IMPLANT
STRIP CLOSURE SKIN 1/2X4 (GAUZE/BANDAGES/DRESSINGS) ×2 IMPLANT
SUT VIC AB 2-0 CT1 27 (SUTURE) ×2
SUT VIC AB 2-0 CT1 TAPERPNT 27 (SUTURE) ×1 IMPLANT
SUT VIC AB 4-0 PS2 18 (SUTURE) ×2 IMPLANT
TAPE STRIPS DRAPE STRL (GAUZE/BANDAGES/DRESSINGS) ×1 IMPLANT
TOWEL OR 17X26 10 PK STRL BLUE (TOWEL DISPOSABLE) ×2 IMPLANT
WATER STERILE IRR 1000ML POUR (IV SOLUTION) ×4 IMPLANT

## 2021-02-12 NOTE — Progress Notes (Signed)
Patient is very confused and rotates between being pleasant and attempting to hit staff.

## 2021-02-12 NOTE — Consult Note (Signed)
ORTHOPAEDIC CONSULTATION  REQUESTING PHYSICIAN: Hosie Poisson, MD  Time called na Time arrived na  Chief Complaint: r fem neck fx  HPI: I have reviewed below history with family and I agree:  87 year old female with past medical history of dementia with behavioral disturbance who presents to [facility] emergency department via EMS from [facility] memory care facility after sustaining a fall.   Patient is an extremely poor historian due to longstanding history of dementia.  Majority history is been obtained from discussion with James Ivanoff (medical POA), the emergency department staff and review of triage/EMS notes.   Family states that when they went to visit the patient earlier in the day on 11/5 she was visibly much weaker than usual to the point where she had difficulty ambulating.  During their visit with her she had to be pushed around in a wheelchair when she typically ambulates on her own without assistance at baseline.  They report the patient has not exhibited any other symptoms of the ordinary and denies any recent cough, shortness of breath, fevers, diarrhea or abdominal pain.   The evening of 11/5 patient was attempting to get out of bed when she slipped and fell to the ground.  Immediately after falling patient experienced severe right hip pain.  Fall was apparently unwitnessed.  According to staff, there was no loss of consciousness that precipitated the fall.  Due to substantial pain EMS was contacted who promptly brought the patient into [facility] emergency department for evaluation.   Upon evaluation in the emergency department patient underwent trauma survey including CT imaging of the head and cervical spine both of which were unremarkable.  Right hip x-ray was performed due to patient's substantial pain revealing an impacted subcapital fracture of the right femoral neck.  50 mcg of fentanyl was administered for substantial right hip pain.  ER  provider discussed case with Dr. Lynann Bologna with orthopedic surgery who asked for medicine to admit the patient with orthopedic surgery to consult on the patient in the morning.  The hospitalist group has now been called to assess the patient for admission to the hospital.  Past Medical History:  Diagnosis Date   Dementia ([place])    Dyspnea on exertion    Grief reaction    Memory loss    Osteopenia 04/2015   t score -1.8   Past Surgical History:  Procedure Laterality Date   ABDOMINAL HYSTERECTOMY     BSO   OOPHORECTOMY     BSO   Social History   Socioeconomic History   Marital status: Widowed    Spouse name: Not on file   Number of children: Not on file   Years of education: Not on file   Highest education level: Not on file  Occupational History   Not on file  Tobacco Use   Smoking status: Never   Smokeless tobacco: Never  Vaping Use   Vaping Use: Never used  Substance and Sexual Activity   Alcohol use: No    Alcohol/week: 0.0 standard drinks   Drug use: No   Sexual activity: Never    Birth control/protection: Surgical    Comment: HYST-1st intercourse 56 yo-1 partner  Other Topics Concern   Not on file  Social History Narrative   Right handed   [place] at pts home 24/7   Social Determinants of Health   Financial Resource Strain: Not on file  Food Insecurity: Not on  file  Transportation Needs: Not on file  Physical Activity: Not on file  Stress: Not on file  Social Connections: Not on file   Family History  Problem Relation Age of Onset   Heart disease Father    Allergies  Allergen Reactions   Penicillins Rash    Did it involve swelling of the face/tongue/throat, SOB, or low BP? N Did it involve sudden or severe rash/hives, skin peeling, or any reaction on the inside of your mouth or nose? N Did you need to seek medical attention at a hospital or doctor's office? N When did it last happen?       If all above answers are "NO", may  proceed with cephalosporin use.    Prior to Admission medications   Medication Sig Start Date End Date Taking? Authorizing Provider  acetaminophen (TYLENOL) 325 MG tablet Take 650 mg by mouth 2 (two) times daily as needed for moderate pain or mild pain. Use caution with APAP, total daily dose greater than 3000 MG   Yes [provider]  citalopram (CELEXA) 10 MG tablet Take 1 tablet (10 mg total) by mouth daily. 02/26/20  Yes Cameron Sprang, MD  divalproex (DEPAKOTE ER) 250 MG 24 hr tablet TAKE 1 TABLET BY MOUTH EVERY DAY AT NIGHT Patient taking differently: Take 250 mg by mouth at bedtime. 02/26/20  Yes Cameron Sprang, MD  donepezil (ARICEPT) 10 MG tablet Take 1 tablet every night Patient taking differently: Take 10 mg by mouth daily. Take 1 tablet every night 02/26/20  Yes Cameron Sprang, MD  loperamide (IMODIUM) 2 MG capsule Take 2-4 mg by mouth See admin instructions. Take 2 tablets by mouth after initial loose stool, then 1 tablet after each additional. Do not exceed 4 tablets in 24 hours.   Yes [provider]  memantine (NAMENDA) 10 MG tablet 1 TABLET TWICE A DAY Patient taking differently: Take 10 mg by mouth 2 (two) times daily. 02/26/20  Yes Cameron Sprang, MD  mirtazapine (REMERON) 7.5 MG tablet Take 7.5 mg by mouth at bedtime. 12/25/20  Yes [provider]  acetaminophen (TYLENOL) 325 MG tablet Take 650 mg by mouth in the morning and at bedtime. Patient not taking: No sig reported    [provider]   DG Chest 1 View  Result Date: 02/11/2021 CLINICAL DATA:  Fall, hip pain.  Slipped out of bed. EXAM: CHEST  1 VIEW COMPARISON:  Radiograph 06/08/2020 FINDINGS: Lung volumes are low. There is mild elevation of the right hemidiaphragm. Multiple skin folds project over the left chest. No pneumothorax, large pleural effusion or acute airspace disease. Stable heart size and mediastinal contours. No acute osseous findings. IMPRESSION: Low lung volumes  without acute abnormality. Electronically Signed   By: Keith Rake M.D.   On: 02/11/2021 23:06   CT HEAD WO CONTRAST  Result Date: 02/11/2021 CLINICAL DATA:  Recent fall with headaches, initial encounter EXAM: CT HEAD WITHOUT CONTRAST TECHNIQUE: Contiguous axial images were obtained from the base of the skull through the vertex without intravenous contrast. COMPARISON:  06/08/2020 FINDINGS: Brain: No evidence of acute infarction, hemorrhage, hydrocephalus, extra-axial collection or mass lesion/mass effect. Mild atrophic changes and chronic white matter ischemic changes are noted. Vascular: No hyperdense vessel or unexpected calcification. Skull: Normal. Negative for fracture or focal lesion. Sinuses/Orbits: No acute finding. Other: None. IMPRESSION: Chronic atrophic and ischemic changes stable in appearance from the prior exam. No acute abnormality noted. Electronically Signed   By: Inez Catalina  M.D.   On: 02/11/2021 23:36   CT Cervical Spine Wo Contrast  Result Date: 02/11/2021 CLINICAL DATA:  Trauma. EXAM: CT CERVICAL SPINE WITHOUT CONTRAST TECHNIQUE: Multidetector CT imaging of the cervical spine was performed without intravenous contrast. Multiplanar CT image reconstructions were also generated. COMPARISON:  Cervical spine CT dated 06/08/2020. FINDINGS: Evaluation of this exam is limited due to motion artifact. Alignment: No acute subluxation. There is straightening of normal cervical lordosis which may be positional or due to muscle spasm. Skull base and vertebrae: No acute fracture. Soft tissues and spinal canal: No prevertebral fluid or swelling. No visible canal hematoma. Disc levels:  Multilevel degenerative changes. Upper chest: Negative. Other: None IMPRESSION: 1. No acute/traumatic cervical spine pathology. 2. Multilevel degenerative changes detailed by level above. Electronically Signed   By: Anner Crete M.D.   On: 02/11/2021 23:49   DG Hip Unilat With Pelvis 2-3 Views  Right  Result Date: 02/11/2021 CLINICAL DATA:  Fall and right hip pain. EXAM: DG HIP (WITH OR WITHOUT PELVIS) 2-3V RIGHT COMPARISON:  Pelvic radiograph dated 03/22/2020. FINDINGS: There is an impacted subcapital fracture of the right femoral neck. CT may provide better evaluation. No dislocation. The bones are osteopenic. The soft tissues are unremarkable. IMPRESSION: Impacted subcapital fracture of the right femoral neck. Electronically Signed   By: Anner Crete M.D.   On: 02/11/2021 23:06    Positive ROS: All other systems have been reviewed and were otherwise negative with the exception of those mentioned in the HPI and as above.  Labs cbc Recent Labs    02/11/21 2250 02/12/21 0322  WBC 7.8 5.2  HGB 10.0* 9.4*  HCT 31.9* 30.3*  PLT 134* 133*    Labs inflam No results for input(s): CRP in the last 72 hours.  Invalid input(s): ESR  Labs coag Recent Labs    02/11/21 2250 02/12/21 0322  INR 1.0 1.0    Recent Labs    02/11/21 2250 02/12/21 0322  NA 142 140  K 4.2 3.9  CL 106 108  CO2 30 28  GLUCOSE 118* 107*  BUN 30* 26*  CREATININE 0.68 0.56  CALCIUM 8.7* 8.6*    Physical Exam: Vitals:   02/12/21 0611 02/12/21 0918  BP: 130/66 (!) 141/72  Pulse: 75 76  Resp: 20 14  Temp: 97.7 F (36.5 C) (!) 97.5 F (36.4 C)  SpO2: 100% 100%   General: Alert, no acute distress Cardiovascular: No pedal edema Respiratory: No cyanosis, no use of accessory musculature GI: No organomegaly, abdomen is soft and non-tender Skin: No lesions in the area of chief complaint other than those listed below in MSK exam.  Neurologic: Sensation intact distally save for the below mentioned MSK exam Psychiatric: dementia Lymphatic: No axillary or cervical lymphadenopathy  MUSCULOSKELETAL:  Skin bening, RLE: NVI Other extremities are atraumatic with painless ROM and NVI.  Assessment: R fem neck fracture  Plan: I disdcussed R&B with family and patient and they wish to proceed with  Hip pinning. WBAT post op   Renette Butters, MD    02/12/2021 12:28 PM

## 2021-02-12 NOTE — Assessment & Plan Note (Signed)
   Patient suffered a mechanical fall resulting in a right femoral neck fracture  ER provider discussed case with Dr. Yevette Edwards with orthopedic surgery will evaluate patient in consultation  Will keep patient n.p.o. for now   Gentle intravenous hydration  As needed opiate-based analgesics for substantial associated pain  Obtaining vitamin D level

## 2021-02-12 NOTE — Assessment & Plan Note (Signed)
   Per my discussion with Shuree Brossart the Medical POA she has confirmed that the patient is DNR/DNI  She also confirms the patient does ambulate at baseline and she does want that any potential operative intervention at surgery recommends be pursued

## 2021-02-12 NOTE — Progress Notes (Signed)
87 year old female with past medical history of dementia with behavioral disturbance who presents to [facility] emergency department via EMS from [facility] memory care facility after sustaining a fall.  PT admitted earlier this am, was found to have  impacted subcapital fracture of the right femoral neck. Orthopedics consulted and she underwent surgical repair.   Continue to monitor.    Kathlen Mody , MD

## 2021-02-12 NOTE — Op Note (Signed)
02/11/2021 - 02/12/2021  12:40 PM  PATIENT:  Rita Torres    PRE-OPERATIVE DIAGNOSIS:  RIGHT HIP FRACTURE  POST-OPERATIVE DIAGNOSIS:  Same  PROCEDURE:  CANNULATED HIP PINNING  SURGEON:  Sheral Apley, MD  ASSISTANT: Levester Fresh, PA-C, he was present and scrubbed throughout the case, critical for completion in a timely fashion, and for retraction, instrumentation, and closure.   ANESTHESIA:   General  PREOPERATIVE INDICATIONS:  Rita Torres is a  87 y.o. female who fell and was found to have a diagnosis of RIGHT HIP FRACTURE who elected for surgical management.    The risks benefits and alternatives were discussed with the patient preoperatively including but not limited to the risks of infection, bleeding, nerve injury, cardiopulmonary complications, blood clots, malunion, nonunion, avascular necrosis, the need for revision surgery, the potential for conversion to hemiarthroplasty, among others, and the patient was willing to proceed.  OPERATIVE IMPLANTS: 6.5 mm cannulated screws x3  OPERATIVE FINDINGS: Clinical osteoporosis with weak bone, proximal femur  OPERATIVE PROCEDURE: The patient was brought to the operating room and placed in supine position. IV antibiotics were given. General anesthesia administered. Foley was also given. The patient was placed on the fracture table. The operative extremity was positioned, without any significant reduction maneuver and was prepped and draped in usual sterile fashion.  Time out was performed.  Small incisions were made distal to the greater trochanter, and 3 guidewires were introduced Into an inverted triangle configuration. The lengths were measured. The reduction was slightly valgus, and near-anatomic. I opened the cortex with a cannulated drill, and then placed the screws into position. Satisfactory fixation was achieved. I sequentially tightened the screws by hand.  I performed a live fluoroscopic exam and no screw penetrance was  noted. All threads crossed the fracture site.   The wounds were irrigated copiously, and repaired with Vicryl with Steri-Strips and sterile gauze. There no complications and the patient tolerated the procedure well.  The patient will be weightbearing as tolerated, VTE prophylaxis will be: mobilization and chemical px.

## 2021-02-12 NOTE — Assessment & Plan Note (Signed)
   Per my discussion with the daughter, patient has been exhibiting an approximate 24-hour history of sudden weakness and difficulty with ambulation  Daughter denies patient complaining of any pain in the hips or elsewhere when she saw her on 11/5.  Daughter denies any other significant symptoms or change in appetite.  Unclear as to whether this new onset of generalized weakness precipitated the patient's fall.  Will obtain EKG, urinalysis, chest x-ray, COVID-19 and influenza PCR testing as well as CT head to evaluate for any etiology of patient's recent development of weakness

## 2021-02-12 NOTE — Anesthesia Procedure Notes (Signed)
Spinal  Start time: 02/12/2021 12:38 PM End time: 02/12/2021 12:40 PM Reason for block: surgical anesthesia Staffing Performed: anesthesiologist  Anesthesiologist: Shelton Silvas, MD Preanesthetic Checklist Completed: patient identified, IV checked, site marked, risks and benefits discussed, surgical consent, monitors and equipment checked, pre-op evaluation and timeout performed Spinal Block Patient position: right lateral decubitus Prep: DuraPrep and site prepped and draped Location: L3-4 Injection technique: single-shot Needle Needle type: Pencan  Needle gauge: 24 G Needle length: 10 cm Needle insertion depth: 10 cm Assessment Events: CSF return Additional Notes Patient tolerated well. No immediate complications.

## 2021-02-12 NOTE — Assessment & Plan Note (Addendum)
   Longstanding known history of dementia  Per my discussion with the daughter patient mentation is seemingly currently at baseline  Minimizing uncomfortable stimuli  Nursing to perform frequent reassessments and redirect the patient frequently  Fall precautions

## 2021-02-12 NOTE — Progress Notes (Signed)
Initial Nutrition Assessment RD working remotely.  DOCUMENTATION CODES:   Not applicable  INTERVENTION:  - will order Ensure Surgery BID, each supplement provides 330 kcal and 18 grams of protein. - will order 1 tablet multivitamin with minerals/day. - complete NFPE when feasible.  - weigh patient today.    NUTRITION DIAGNOSIS:   Increased nutrient needs related to hip fracture, post-op healing as evidenced by estimated needs.  GOAL:   Patient will meet greater than or equal to 90% of their needs  MONITOR:   Diet advancement, PO intake, Supplement acceptance, Labs, Weight trends  REASON FOR ASSESSMENT:   Consult Assessment of nutrition requirement/status  ASSESSMENT:   87 year old female with medical history of dementia with behavioral disturbance and osteopenia. She presented to the ED via EMS from [facility] facility after sustaining a fall. She is an extremely poor historian, per ED noted. Medical POA shared with ED staff that patient is usually able to ambulate unassisted but that on the day of the fall, when they visited her, she was noticeably weaker than usual and needed to be pushed around in a wheelchair.  She is noted to be a/o to self only. Patient is from a memory care facility.   She has been NPO since admission pending R hip surgery this afternoon, per Ortho note earlier this AM.   Weight yesterday was 150 lb which is exactly the same as weight documented on 03/22/20. Prior to that, no weight recordings sine 12/10/18 when she weighed 142 lb.    Labs reviewed; BUN: 26 mg/dl, Ca: 8.6 mg/dl, Alk Phos elevated.  Medications reviewed. IVF; LR @ 75 ml/hr.     NUTRITION - FOCUSED PHYSICAL EXAM:  Unable to complete at this time.   Diet Order:   Diet Order             Diet NPO time specified Except for: Ice Chips, Sips with Meds  Diet effective now                   EDUCATION NEEDS:   No education needs have been identified at this  time  Skin:  Skin Assessment: Skin Integrity Issues: Skin Integrity Issues:: Stage I Stage I: L knee  Last BM:  PTA/unknown  Height:   Ht Readings from Last 1 Encounters:  02/11/21 '5\' 5"'  (1.651 m)    Weight:   Wt Readings from Last 1 Encounters:  02/11/21 68 kg    Estimated Nutritional Needs:  Kcal:  1600-1800 kcal Protein:  80-90 grams Fluid:  >/= 1.7 L/day      Jarome Matin, MS, RD, LDN, CNSC Inpatient Clinical Dietitian RD pager # available in [facility]  After hours/weekend pager # available in [facility]

## 2021-02-12 NOTE — Transfer of Care (Signed)
Immediate Anesthesia Transfer of Care Note  Patient: Rita Torres  Procedure(s) Performed: CANNULATED HIP PINNING (Right: Hip)  Patient Location: PACU  Anesthesia Type:General  Level of Consciousness: sedated and responds to stimulation  Airway & Oxygen Therapy: Patient Spontanous Breathing and Patient connected to face mask oxygen  Post-op Assessment: Report given to RN and Post -op Vital signs reviewed and stable  Post vital signs: Reviewed and stable  Last Vitals:  Vitals Value Taken Time  BP 109/52 02/12/21 1332  Temp    Pulse    Resp 14 02/12/21 1334  SpO2    Vitals shown include unvalidated device data.  Last Pain:  Vitals:   02/12/21 0937  TempSrc:   PainSc: 0-No pain         Complications: No notable events documented.

## 2021-02-12 NOTE — Anesthesia Postprocedure Evaluation (Signed)
Anesthesia Post Note  Patient: Rita Torres  Procedure(s) Performed: CANNULATED HIP PINNING (Right: Hip)     Patient location during evaluation: PACU Anesthesia Type: Spinal Level of consciousness: oriented and awake and alert Pain management: pain level controlled Vital Signs Assessment: post-procedure vital signs reviewed and stable Respiratory status: spontaneous breathing, respiratory function stable and patient connected to nasal cannula oxygen Cardiovascular status: blood pressure returned to baseline and stable Postop Assessment: no headache, no backache, no apparent nausea or vomiting and spinal receding Anesthetic complications: no   No notable events documented.  Last Vitals:  Vitals:   02/12/21 1415 02/12/21 1430  BP: 126/71 (!) 117/100  Pulse: 63 64  Resp: 13 16  Temp: 36.6 C   SpO2: 99% 100%    Last Pain:  Vitals:   02/12/21 0937  TempSrc:   PainSc: 0-No pain                 Shelton Silvas

## 2021-02-12 NOTE — Progress Notes (Signed)
ANTICOAGULATION CONSULT NOTE   Pharmacy Consult for Rivaroxaban Indication: VTE prophylaxis  Allergies  Allergen Reactions   Penicillins Rash    Did it involve swelling of the face/tongue/throat, SOB, or low BP? N Did it involve sudden or severe rash/hives, skin peeling, or any reaction on the inside of your mouth or nose? N Did you need to seek medical attention at a hospital or doctor's office? N When did it last happen?       If all above answers are "NO", may proceed with cephalosporin use.     Patient Measurements: Height: 5\' 5"  (165.1 cm) Weight: 54.9 kg (121 lb 0.5 oz) IBW/kg (Calculated) : 57 Heparin Dosing Weight:   Vital Signs: Temp: 97.5 F (36.4 C) (11/06 1654) Temp Source: Oral (11/06 1654) BP: 138/89 (11/06 1654) Pulse Rate: 76 (11/06 1654)  Labs: Recent Labs    02/11/21 2250 02/12/21 0322  HGB 10.0* 9.4*  HCT 31.9* 30.3*  PLT 134* 133*  APTT  --  30  LABPROT 12.8 13.0  INR 1.0 1.0  CREATININE 0.68 0.56    Estimated Creatinine Clearance: 43.7 mL/min (by C-G formula based on SCr of 0.56 mg/dL).   Medications:  Medications Prior to Admission  Medication Sig Dispense Refill Last Dose   acetaminophen (TYLENOL) 325 MG tablet Take 650 mg by mouth 2 (two) times daily as needed for moderate pain or mild pain. Use caution with APAP, total daily dose greater than 3000 MG   02/11/2021   citalopram (CELEXA) 10 MG tablet Take 1 tablet (10 mg total) by mouth daily. 90 tablet 2 02/11/2021   divalproex (DEPAKOTE ER) 250 MG 24 hr tablet TAKE 1 TABLET BY MOUTH EVERY DAY AT NIGHT (Patient taking differently: Take 250 mg by mouth at bedtime.) 90 tablet 2 02/11/2021 at 2000   donepezil (ARICEPT) 10 MG tablet Take 1 tablet every night (Patient taking differently: Take 10 mg by mouth daily. Take 1 tablet every night) 90 tablet 2 02/11/2021   loperamide (IMODIUM) 2 MG capsule Take 2-4 mg by mouth See admin instructions. Take 2 tablets by mouth after initial loose stool, then 1  tablet after each additional. Do not exceed 4 tablets in 24 hours.   unk   memantine (NAMENDA) 10 MG tablet 1 TABLET TWICE A DAY (Patient taking differently: Take 10 mg by mouth 2 (two) times daily.) 180 tablet 2 02/11/2021   mirtazapine (REMERON) 7.5 MG tablet Take 7.5 mg by mouth at bedtime.   02/11/2021   acetaminophen (TYLENOL) 325 MG tablet Take 650 mg by mouth in the morning and at bedtime. (Patient not taking: No sig reported)   Not Taking    Assessment: Pharmacy is consulted to dose rivaroxaban in 87 yo female for VTE prophylaxis after surgery to replace right hip fracture.   Per consult, med is to start POD1.   Today, 02/12/21 SCr 0.56 mg/dl, CrC; 44 ml/min Hgb 9.4 Plt 133   Goal of Therapy:  Monitor platelets by anticoagulation protocol: Yes   Plan:  Starting 02/13/2021, start rivaroxaban 10 mg PO daily  Monitor CBC, SCr Monitor for signs and symptoms of bleeding  13/10/2020, PharmD, BCPS 02/12/2021 6:18 PM

## 2021-02-12 NOTE — Progress Notes (Signed)
Pt will have right hip fracture repaired by Dr Renaye Rakers later today after lunch approx. Maintain NPO.

## 2021-02-12 NOTE — H&P (Signed)
History and Physical    Rita Torres LFY:[id number] DOB: [birth year] DOA: 02/11/2021  PCP: Daisy Floro, MD  Patient coming from: [facility]   Chief Complaint:  Chief Complaint  Patient presents with   Fall     HPI:    87 year old female with past medical history of dementia with behavioral disturbance who presents to [facility] long hospital emergency department via EMS from [facility] memory care facility after sustaining a fall.  Patient is an extremely poor historian due to longstanding history of dementia.  Majority history is been obtained from discussion with Gilmer Mor (medical POA), the emergency department staff and review of triage/EMS notes.  Family states that when they went to visit the patient earlier in the day on 11/5 she was visibly much weaker than usual to the point where she had difficulty ambulating.  During their visit with her she had to be pushed around in a wheelchair when she typically ambulates on her own without assistance at baseline.  They report the patient has not exhibited any other symptoms of the ordinary and denies any recent cough, shortness of breath, fevers, diarrhea or abdominal pain.  The evening of 11/5 patient was attempting to get out of bed when she slipped and fell to the ground.  Immediately after falling patient experienced severe right hip pain.  Fall was apparently unwitnessed.  According to staff, there was no loss of consciousness that precipitated the fall.  Due to substantial pain EMS was contacted who promptly brought the patient into [facility] emergency department for evaluation.  Upon evaluation in the emergency department patient underwent trauma survey including CT imaging of the head and cervical spine both of which were unremarkable.  Right hip x-ray was performed due to patient's substantial pain revealing an impacted subcapital fracture of the right femoral neck.  50 mcg of fentanyl was  administered for substantial right hip pain.  ER provider discussed case with Dr. Yevette Edwards with orthopedic surgery who asked for medicine to admit the patient with orthopedic surgery to consult on the patient in the morning.  The hospitalist group has now been called to assess the patient for admission to the hospital.  Review of Systems:   Review of Systems  Unable to perform ROS: Mental acuity   Past Medical History:  Diagnosis Date   Dementia (HCC)    Dyspnea on exertion    Grief reaction    Memory loss    Osteopenia 04/2015   t score -1.8    Past Surgical History:  Procedure Laterality Date   ABDOMINAL HYSTERECTOMY     BSO   OOPHORECTOMY     BSO     reports that she has never smoked. She has never used smokeless tobacco. She reports that she does not drink alcohol and does not use drugs.  Allergies  Allergen Reactions   Penicillins Rash    Did it involve swelling of the face/tongue/throat, SOB, or low BP? N Did it involve sudden or severe rash/hives, skin peeling, or any reaction on the inside of your mouth or nose? N Did you need to seek medical attention at a hospital or doctor's office? N When did it last happen?       If all above answers are "NO", may proceed with cephalosporin use.     Family History  Problem Relation Age of Onset   Heart disease Father      Prior to Admission medications   Medication Sig Start  Date End Date Taking? Authorizing Provider  acetaminophen (TYLENOL) 325 MG tablet Take 650 mg by mouth in the morning and at bedtime.   Yes [provider]  acetaminophen (TYLENOL) 325 MG tablet Take 650 mg by mouth 2 (two) times daily as needed for moderate pain or mild pain. Use caution with APAP, total daily dose greater than 3000 MG   Yes [provider]  citalopram (CELEXA) 10 MG tablet Take 1 tablet (10 mg total) by mouth daily. 02/26/20  Yes Van Clines, MD  divalproex (DEPAKOTE ER) 250 MG 24 hr tablet TAKE 1 TABLET BY  MOUTH EVERY DAY AT NIGHT Patient taking differently: Take 250 mg by mouth daily. 02/26/20  Yes Van Clines, MD  donepezil (ARICEPT) 10 MG tablet Take 1 tablet every night Patient taking differently: Take 10 mg by mouth daily. Take 1 tablet every night 02/26/20  Yes Van Clines, MD  loperamide (IMODIUM) 2 MG capsule Take 2-4 mg by mouth See admin instructions. Take 2 tablets by mouth after initial loose stool, then 1 tablet after each additional. Do not exceed 4 tablets in 24 hours.   Yes [provider]  memantine (NAMENDA) 10 MG tablet 1 TABLET TWICE A DAY Patient taking differently: Take 10 mg by mouth 2 (two) times daily. 02/26/20  Yes Van Clines, MD  mirtazapine (REMERON) 7.5 MG tablet Take 7.5 mg by mouth at bedtime. 12/25/20  Yes [provider]  Ascorbic Acid (VITAMIN C) 100 MG tablet Take 100 mg by mouth daily.    [provider]  b complex vitamins tablet Take 1 tablet by mouth daily.    [provider]    Physical Exam: Vitals:   02/11/21 2151 02/11/21 2200 02/12/21 0019 02/12/21 0136  BP: 120/76 128/81 133/82 (!) 129/52  Pulse: 90 76 (!) 57 79  Resp: 19 19 14 14   Temp: (!) 97.5 F (36.4 C)   98 F (36.7 C)  TempSrc: Oral   Oral  SpO2: 100% 99% 96% 98%  Weight:      Height:        Constitutional: Awake and alert but only oriented x1 (self),  no associated distress.   Skin: Notable ecchymoses over the right hip and right thigh.  Otherwise no rashes or lesions seen.  Poor skin turgor noted. Eyes: Pupils are equally reactive to light.  No evidence of scleral icterus or conjunctival pallor.  ENMT: Moist mucous membranes noted.  Posterior pharynx clear of any exudate or lesions.   Neck: normal, supple, no masses, no thyromegaly.  No evidence of jugular venous distension.   Respiratory: clear to auscultation bilaterally, no wheezing, no crackles. Normal respiratory effort. No accessory muscle use.  Cardiovascular: Regular rate and  rhythm, no murmurs / rubs / gallops. No extremity edema. 2+ pedal pulses. No carotid bruits.  Chest:   Nontender without crepitus or deformity.   Back:   Nontender without crepitus or deformity. Abdomen: Abdomen is soft and nontender.  No evidence of intra-abdominal masses.  Positive bowel sounds noted in all quadrants.   Musculoskeletal: Pain with both passive and active range of motion of the right hip.   no contractures. Normal muscle tone.  Neurologic: Patient responding to verbal stimuli.  Patient localizes to pain.  Patient only intermittently follows commands.  Sensation is grossly intact.  Strength limited of the right lower extremity due to pain.  Psychiatric: Flat affect.  Unable to assess due to substantial confusion at baseline.  Patient does  not seem to possess insight as to her current situation.  Labs on Admission: I have personally reviewed following labs and imaging studies -   CBC: Recent Labs  Lab 02/11/21 2250  WBC 7.8  NEUTROABS 6.2  HGB 10.0*  HCT 31.9*  MCV 99.7  PLT 134*   Basic Metabolic Panel: Recent Labs  Lab 02/11/21 2250  NA 142  K 4.2  CL 106  CO2 30  GLUCOSE 118*  BUN 30*  CREATININE 0.68  CALCIUM 8.7*   GFR: Estimated Creatinine Clearance: 45.4 mL/min (by C-G formula based on SCr of 0.68 mg/dL). Liver Function Tests: No results for input(s): AST, ALT, ALKPHOS, BILITOT, PROT, ALBUMIN in the last 168 hours. No results for input(s): LIPASE, AMYLASE in the last 168 hours. No results for input(s): AMMONIA in the last 168 hours. Coagulation Profile: Recent Labs  Lab 02/11/21 2250  INR 1.0   Cardiac Enzymes: No results for input(s): CKTOTAL, CKMB, CKMBINDEX, TROPONINI in the last 168 hours. BNP (last 3 results) No results for input(s): PROBNP in the last 8760 hours. HbA1C: No results for input(s): HGBA1C in the last 72 hours. CBG: No results for input(s): GLUCAP in the last 168 hours. Lipid Profile: No results for input(s): CHOL, HDL,  LDLCALC, TRIG, CHOLHDL, LDLDIRECT in the last 72 hours. Thyroid Function Tests: No results for input(s): TSH, T4TOTAL, FREET4, T3FREE, THYROIDAB in the last 72 hours. Anemia Panel: No results for input(s): VITAMINB12, FOLATE, FERRITIN, TIBC, IRON, RETICCTPCT in the last 72 hours. Urine analysis:    Component Value Date/Time   COLORURINE YELLOW 10/05/2018 1400   APPEARANCEUR CLEAR 10/05/2018 1400   LABSPEC 1.010 10/05/2018 1400   PHURINE 6.0 10/05/2018 1400   GLUCOSEU NEGATIVE 10/05/2018 1400   HGBUR NEGATIVE 10/05/2018 1400   BILIRUBINUR NEGATIVE 10/05/2018 1400   KETONESUR NEGATIVE 10/05/2018 1400   PROTEINUR NEGATIVE 10/05/2018 1400   UROBILINOGEN 1 12/29/2013 1433   NITRITE NEGATIVE 10/05/2018 1400   LEUKOCYTESUR NEGATIVE 10/05/2018 1400    Radiological Exams on Admission - Personally Reviewed: DG Chest 1 View  Result Date: 02/11/2021 CLINICAL DATA:  Fall, hip pain.  Slipped out of bed. EXAM: CHEST  1 VIEW COMPARISON:  Radiograph 06/08/2020 FINDINGS: Lung volumes are low. There is mild elevation of the right hemidiaphragm. Multiple skin folds project over the left chest. No pneumothorax, large pleural effusion or acute airspace disease. Stable heart size and mediastinal contours. No acute osseous findings. IMPRESSION: Low lung volumes without acute abnormality. Electronically Signed   By: Narda Rutherford M.D.   On: 02/11/2021 23:06   CT HEAD WO CONTRAST  Result Date: 02/11/2021 CLINICAL DATA:  Recent fall with headaches, initial encounter EXAM: CT HEAD WITHOUT CONTRAST TECHNIQUE: Contiguous axial images were obtained from the base of the skull through the vertex without intravenous contrast. COMPARISON:  06/08/2020 FINDINGS: Brain: No evidence of acute infarction, hemorrhage, hydrocephalus, extra-axial collection or mass lesion/mass effect. Mild atrophic changes and chronic white matter ischemic changes are noted. Vascular: No hyperdense vessel or unexpected calcification. Skull:  Normal. Negative for fracture or focal lesion. Sinuses/Orbits: No acute finding. Other: None. IMPRESSION: Chronic atrophic and ischemic changes stable in appearance from the prior exam. No acute abnormality noted. Electronically Signed   By: Alcide Clever M.D.   On: 02/11/2021 23:36   CT Cervical Spine Wo Contrast  Result Date: 02/11/2021 CLINICAL DATA:  Trauma. EXAM: CT CERVICAL SPINE WITHOUT CONTRAST TECHNIQUE: Multidetector CT imaging of the cervical spine was performed without intravenous contrast. Multiplanar CT image reconstructions were  also generated. COMPARISON:  Cervical spine CT dated 06/08/2020. FINDINGS: Evaluation of this exam is limited due to motion artifact. Alignment: No acute subluxation. There is straightening of normal cervical lordosis which may be positional or due to muscle spasm. Skull base and vertebrae: No acute fracture. Soft tissues and spinal canal: No prevertebral fluid or swelling. No visible canal hematoma. Disc levels:  Multilevel degenerative changes. Upper chest: Negative. Other: None IMPRESSION: 1. No acute/traumatic cervical spine pathology. 2. Multilevel degenerative changes detailed by level above. Electronically Signed   By: Elgie Collard M.D.   On: 02/11/2021 23:49   DG Hip Unilat With Pelvis 2-3 Views Right  Result Date: 02/11/2021 CLINICAL DATA:  Fall and right hip pain. EXAM: DG HIP (WITH OR WITHOUT PELVIS) 2-3V RIGHT COMPARISON:  Pelvic radiograph dated 03/22/2020. FINDINGS: There is an impacted subcapital fracture of the right femoral neck. CT may provide better evaluation. No dislocation. The bones are osteopenic. The soft tissues are unremarkable. IMPRESSION: Impacted subcapital fracture of the right femoral neck. Electronically Signed   By: Elgie Collard M.D.   On: 02/11/2021 23:06    EKG: Personally reviewed.  Computer is incorrect, patient is not in supraventricular tachycardia.  Poor baseline, possibly secondary to tremors.  Rhythm is sinus  tachycardia with heart rate of 120 bpm.  No dynamic ST segment changes appreciated.  Assessment/Plan  * Fracture of femoral neck, right, closed (HCC) Patient suffered a mechanical fall resulting in a right femoral neck fracture ER provider discussed case with Dr. Yevette Edwards with orthopedic surgery will evaluate patient in consultation Will keep patient n.p.o. for now  Gentle intravenous hydration As needed opiate-based analgesics for substantial associated pain Obtaining vitamin D level   Generalized weakness Per my discussion with the daughter, patient has been exhibiting an approximate 24-hour history of sudden weakness and difficulty with ambulation Daughter denies patient complaining of any pain in the hips or elsewhere when she saw her on 11/5. Daughter denies any other significant symptoms or change in appetite. Unclear as to whether this new onset of generalized weakness precipitated the patient's fall. Will obtain EKG, urinalysis, chest x-ray, COVID-19 and influenza PCR testing as well as CT head to evaluate for any etiology of patient's recent development of weakness  Dementia with behavioral disturbance Longstanding known history of dementia Per my discussion with the daughter patient mentation is seemingly currently at baseline Minimizing uncomfortable stimuli Nursing to perform frequent reassessments and redirect the patient frequently Fall precautions  Goals of care, counseling/discussion Per my discussion with Lennan Malone the Medical POA she has confirmed that the patient is DNR/DNI She also confirms the patient does ambulate at baseline and she does want that any potential operative intervention at surgery recommends be pursued     Code Status:  DNR  code status decision has been confirmed with: medical POA, Gilmer Mor.  Family Communication: Sherrilyn Nairn, medical POA has been updated on plan of care via phone conversation.  Status is: Inpatient  Remains inpatient  appropriate because: Right hip fracture requiring expert consultation with orthopedic surgery and likely surgical intervention as well as need for intravenous fluids and intermittent opiate-based analgesics with continued diagnostics.        Marinda Elk MD [facility] Pager [phone]  If 7PM-7AM, please contact night-coverage [email or web address] Use universal [facility] password for that web site. If you do not have the password, please call the hospital operator.  02/12/2021, 4:07 AM

## 2021-02-12 NOTE — Anesthesia Preprocedure Evaluation (Addendum)
Anesthesia Evaluation  Patient identified by MRN, date of birth, ID band Patient confused    Reviewed: Allergy & Precautions, NPO status , Patient's Chart, lab work & pertinent test results  Airway Mallampati: I       Dental  (+) Teeth Intact   Pulmonary    breath sounds clear to auscultation       Cardiovascular negative cardio ROS   Rhythm:Regular Rate:Normal     Neuro/Psych PSYCHIATRIC DISORDERS Dementia    GI/Hepatic   Endo/Other    Renal/GU negative Renal ROS     Musculoskeletal   Abdominal Normal abdominal exam  (+)   Peds  Hematology negative hematology ROS (+)   Anesthesia Other Findings   Reproductive/Obstetrics                            Anesthesia Physical Anesthesia Plan  ASA: 3  Anesthesia Plan: Spinal   Post-op Pain Management:    Induction: Intravenous  PONV Risk Score and Plan: 3 and Ondansetron, Propofol infusion and Treatment may vary due to age or medical condition  Airway Management Planned: Natural Airway and Simple Face Mask  Additional Equipment: None  Intra-op Plan:   Post-operative Plan:   Informed Consent: I have reviewed the patients History and Physical, chart, labs and discussed the procedure including the risks, benefits and alternatives for the proposed anesthesia with the patient or authorized representative who has indicated his/her understanding and acceptance.   Patient has DNR.  Discussed DNR with power of attorney and Continue DNR.   Consent reviewed with POA  Plan Discussed with: CRNA  Anesthesia Plan Comments: (Lab Results      Component                Value               Date                      WBC                      5.2                 02/12/2021                HGB                      9.4 (L)             02/12/2021                HCT                      30.3 (L)            02/12/2021                MCV                       98.7                02/12/2021                PLT                      133 (L)             02/12/2021           )  Anesthesia Quick Evaluation  

## 2021-02-13 DIAGNOSIS — S72001D Fracture of unspecified part of neck of right femur, subsequent encounter for closed fracture with routine healing: Secondary | ICD-10-CM | POA: Diagnosis not present

## 2021-02-13 DIAGNOSIS — F03918 Unspecified dementia, unspecified severity, with other behavioral disturbance: Secondary | ICD-10-CM | POA: Diagnosis not present

## 2021-02-13 DIAGNOSIS — R531 Weakness: Secondary | ICD-10-CM | POA: Diagnosis not present

## 2021-02-13 LAB — CBC
HCT: 29.3 % — ABNORMAL LOW (ref 36.0–46.0)
Hemoglobin: 9 g/dL — ABNORMAL LOW (ref 12.0–15.0)
MCH: 30.4 pg (ref 26.0–34.0)
MCHC: 30.7 g/dL (ref 30.0–36.0)
MCV: 99 fL (ref 80.0–100.0)
Platelets: 121 10*3/uL — ABNORMAL LOW (ref 150–400)
RBC: 2.96 MIL/uL — ABNORMAL LOW (ref 3.87–5.11)
RDW: 13.8 % (ref 11.5–15.5)
WBC: 6.4 10*3/uL (ref 4.0–10.5)
nRBC: 0 % (ref 0.0–0.2)

## 2021-02-13 LAB — BASIC METABOLIC PANEL
Anion gap: 6 (ref 5–15)
BUN: 21 mg/dL (ref 8–23)
CO2: 28 mmol/L (ref 22–32)
Calcium: 8.3 mg/dL — ABNORMAL LOW (ref 8.9–10.3)
Chloride: 105 mmol/L (ref 98–111)
Creatinine, Ser: 0.7 mg/dL (ref 0.44–1.00)
GFR, Estimated: >60 mL/min (ref >60)
Glucose, Bld: 122 mg/dL — ABNORMAL HIGH (ref 70–99)
Potassium: 4.1 mmol/L (ref 3.5–5.1)
Sodium: 139 mmol/L (ref 135–145)

## 2021-02-13 LAB — URINE CULTURE

## 2021-02-13 MED ORDER — TRAMADOL HCL 50 MG PO TABS
50.0000 mg | ORAL_TABLET | Freq: Four times a day (QID) | ORAL | Status: DC | PRN
  Administered 2021-02-14 (×2): 50 mg via ORAL
  Filled 2021-02-13 (×2): qty 1

## 2021-02-13 NOTE — Progress Notes (Signed)
PROGRESS NOTE    Rita Torres  OZH:[id number] DOB: 03/01/1933 DOA: 02/11/2021 PCP: Daisy Floro, MD   Chief Complaint  Patient presents with   Fall    Brief Narrative:   87 year old female with past medical history of dementia with behavioral disturbance who presents to [facility] long hospital emergency department via EMS from [facility] memory care facility after sustaining a fall.   PT admitted earlier this am, was found to have  impacted subcapital fracture of the right femoral neck. Orthopedics consulted and she underwent surgical repair.  Assessment & Plan:   Principal Problem:   Fracture of femoral neck, right, closed (HCC) Active Problems:   Dementia with behavioral disturbance   Generalized weakness   Goals of care, counseling/discussion   Pressure injury of skin   Fracture of the femoral neck on the right S/p surgical repair Pain control Therapy evaluations recommending SNF. TOC on board     Dementia  Patient resides at memory care unit No agitation at this time Continue with home medications at this time.  Acute anemia of blood loss secondary to the procedure anemia of chronic disease Hemoglobin on admission around 10 and dropped to 9 today probably from the surgery. Transfuse to keep hemoglobin greater than 7.  Pressure Injury: Pressure Injury 02/12/21 Knee Anterior;Left Stage 1 -  Intact skin with non-blanchable redness of a localized area usually over a bony prominence. red (Active)  02/12/21 0120  Location: Knee  Location Orientation: Anterior;Left  Staging: Stage 1 -  Intact skin with non-blanchable redness of a localized area usually over a bony prominence.  Wound Description (Comments): red  Present on Admission: Yes         DVT prophylaxis: (xarelto.) Code Status: (DNR) Family Communication: none at bedside.  Disposition:   Status is: Inpatient  Remains inpatient appropriate because: unsafe d/c plan         Consultants:  Orthopedics.   Procedures: Cannulated hip pinning of the right hip by Dr Eulah Pont Antimicrobials: None   Subjective: Confused, no new complaints at this time.  Objective: Vitals:   02/13/21 0459 02/13/21 0757 02/13/21 1345 02/13/21 1458  BP: 129/86 (!) 153/64 (!) 145/129 (!) 99/47  Pulse: 71 77 78 85  Resp: 16 17 16    Temp: 98 F (36.7 C) 97.8 F (36.6 C) 98.8 F (37.1 C)   TempSrc: Oral Oral Oral   SpO2: 100% 97% 96%   Weight:      Height:        Intake/Output Summary (Last 24 hours) at 02/13/2021 1530 Last data filed at 02/13/2021 1400 Gross per 24 hour  Intake 860 ml  Output 1000 ml  Net -140 ml   Filed Weights   02/11/21 2149 02/12/21 1006  Weight: 68 kg 54.9 kg    Examination:  General exam: Appears calm and comfortable  Respiratory system: Clear to auscultation. Respiratory effort normal. Cardiovascular system: S1 & S2 heard, RRR. No JVD,  No pedal edema. Gastrointestinal system: Abdomen is nondistended, soft and nontender.  Normal bowel sounds heard. Central nervous system: Alert , confused, not in distress. Able to move all extremities.  Extremities: painful ROM of the right hip.  Skin: No rashes, lesions or ulcers Psychiatry: Mood & affect appropriate.     Data Reviewed: I have personally reviewed following labs and imaging studies  CBC: Recent Labs  Lab 02/11/21 2250 02/12/21 0322 02/13/21 0323  WBC 7.8 5.2 6.4  NEUTROABS 6.2 3.3  --   HGB 10.0* 9.4*  9.0*  HCT 31.9* 30.3* 29.3*  MCV 99.7 98.7 99.0  PLT 134* 133* 121*    Basic Metabolic Panel: Recent Labs  Lab 02/11/21 2250 02/12/21 0322 02/13/21 0323  NA 142 140 139  K 4.2 3.9 4.1  CL 106 108 105  CO2 30 28 28   GLUCOSE 118* 107* 122*  BUN 30* 26* 21  CREATININE 0.68 0.56 0.70  CALCIUM 8.7* 8.6* 8.3*    GFR: Estimated Creatinine Clearance: 43.7 mL/min (by C-G formula based on SCr of 0.7 mg/dL).  Liver Function Tests: Recent Labs  Lab 02/12/21 0322  AST 19   ALT 14  ALKPHOS 147*  BILITOT 0.6  PROT 5.6*  ALBUMIN 2.9*    CBG: No results for input(s): GLUCAP in the last 168 hours.   Recent Results (from the past 240 hour(s))  Resp Panel by RT-PCR (Flu A&B, Covid) Nasopharyngeal Swab     Status: None   Collection Time: 02/12/21 12:05 AM   Specimen: Nasopharyngeal Swab; Nasopharyngeal(NP) swabs in vial transport medium  Result Value Ref Range Status   SARS Coronavirus 2 by RT PCR NEGATIVE NEGATIVE Final    Comment: (NOTE) SARS-CoV-2 target nucleic acids are NOT DETECTED.  The SARS-CoV-2 RNA is generally detectable in upper respiratory specimens during the acute phase of infection. The lowest concentration of SARS-CoV-2 viral copies this assay can detect is 138 copies/mL. A negative result does not preclude SARS-Cov-2 infection and should not be used as the sole basis for treatment or other patient management decisions. A negative result may occur with  improper specimen collection/handling, submission of specimen other than nasopharyngeal swab, presence of viral mutation(s) within the areas targeted by this assay, and inadequate number of viral copies(<138 copies/mL). A negative result must be combined with clinical observations, patient history, and epidemiological information. The expected result is Negative.  Fact Sheet for Patients:  13/06/22  Fact Sheet for Healthcare Providers:  BloggerCourse.com  This test is no t yet approved or cleared by the [employer]iousBroker.it FDA and  has been authorized for detection and/or diagnosis of SARS-CoV-2 by FDA under an Emergency Use Authorization (EUA). This EUA will remain  in effect (meaning this test can be used) for the duration of the COVID-19 declaration under Section 564(b)(1) of the Act, 21 U.S.C.section 360bbb-3(b)(1), unless the authorization is terminated  or revoked sooner.       Influenza A by PCR NEGATIVE NEGATIVE Final    Influenza B by PCR NEGATIVE NEGATIVE Final    Comment: (NOTE) The Xpert Xpress SARS-CoV-2/FLU/RSV plus assay is intended as an aid in the diagnosis of influenza from Nasopharyngeal swab specimens and should not be used as a sole basis for treatment. Nasal washings and aspirates are unacceptable for Xpert Xpress SARS-CoV-2/FLU/RSV testing.  Fact Sheet for Patients: [place]  Fact Sheet for Healthcare Providers: BloggerCourse.com  This test is not yet approved or cleared by the SeriousBroker.it FDA and has been authorized for detection and/or diagnosis of SARS-CoV-2 by FDA under an Emergency Use Authorization (EUA). This EUA will remain in effect (meaning this test can be used) for the duration of the COVID-19 declaration under Section 564(b)(1) of the Act, 21 U.S.C. section 360bbb-3(b)(1), unless the authorization is terminated or revoked.  Performed at [facility], [address], [place], [place]   Urine Culture     Status: Abnormal   Collection Time: 02/12/21  9:08 AM   Specimen: Urine, Clean Catch  Result Value Ref Range Status   Specimen Description  Final    URINE, CLEAN CATCH Performed at [facility], [address], [place], [place] [address]    Special Requests   Final    NONE Performed at [facility], [address], [place], [place] [address]    Culture MULTIPLE SPECIES PRESENT, SUGGEST RECOLLECTION (A)  Final   Report Status 02/13/2021 FINAL  Final  Surgical pcr screen     Status: None   Collection Time: 02/12/21 10:10 AM   Specimen: Nasal Mucosa; Nasal Swab  Result Value Ref Range Status   MRSA, PCR NEGATIVE NEGATIVE Final   Staphylococcus aureus NEGATIVE NEGATIVE Final    Comment: (NOTE) The Xpert SA Assay (FDA approved for NASAL specimens in patients 100 years of age and older), is one component of a comprehensive surveillance  program. It is not intended to diagnose infection nor to guide or monitor treatment. Performed at [facility], [address] [place], [place] [address]          Radiology Studies: DG Chest 1 View  Result Date: 02/11/2021 CLINICAL DATA:  Fall, hip pain.  Slipped out of bed. EXAM: CHEST  1 VIEW COMPARISON:  Radiograph 06/08/2020 FINDINGS: Lung volumes are low. There is mild elevation of the right hemidiaphragm. Multiple skin folds project over the left chest. No pneumothorax, large pleural effusion or acute airspace disease. Stable heart size and mediastinal contours. No acute osseous findings. IMPRESSION: Low lung volumes without acute abnormality. Electronically Signed   By: Narda Rutherford M.D.   On: 02/11/2021 23:06   CT HEAD WO CONTRAST  Result Date: 02/11/2021 CLINICAL DATA:  Recent fall with headaches, initial encounter EXAM: CT HEAD WITHOUT CONTRAST TECHNIQUE: Contiguous axial images were obtained from the base of the skull through the vertex without intravenous contrast. COMPARISON:  06/08/2020 FINDINGS: Brain: No evidence of acute infarction, hemorrhage, hydrocephalus, extra-axial collection or mass lesion/mass effect. Mild atrophic changes and chronic white matter ischemic changes are noted. Vascular: No hyperdense vessel or unexpected calcification. Skull: Normal. Negative for fracture or focal lesion. Sinuses/Orbits: No acute finding. Other: None. IMPRESSION: Chronic atrophic and ischemic changes stable in appearance from the prior exam. No acute abnormality noted. Electronically Signed   By: Alcide Clever M.D.   On: 02/11/2021 23:36   CT Cervical Spine Wo Contrast  Result Date: 02/11/2021 CLINICAL DATA:  Trauma. EXAM: CT CERVICAL SPINE WITHOUT CONTRAST TECHNIQUE: Multidetector CT imaging of the cervical spine was performed without intravenous contrast. Multiplanar CT image reconstructions were also generated. COMPARISON:  Cervical spine CT dated 06/08/2020.  FINDINGS: Evaluation of this exam is limited due to motion artifact. Alignment: No acute subluxation. There is straightening of normal cervical lordosis which may be positional or due to muscle spasm. Skull base and vertebrae: No acute fracture. Soft tissues and spinal canal: No prevertebral fluid or swelling. No visible canal hematoma. Disc levels:  Multilevel degenerative changes. Upper chest: Negative. Other: None IMPRESSION: 1. No acute/traumatic cervical spine pathology. 2. Multilevel degenerative changes detailed by level above. Electronically Signed   By: Elgie Collard M.D.   On: 02/11/2021 23:49   DG C-Arm 1-60 Min-No Report  Result Date: 02/12/2021 Fluoroscopy was utilized by the requesting physician.  No radiographic interpretation.   DG HIP OPERATIVE UNILAT W OR W/O PELVIS RIGHT  Result Date: 02/12/2021 CLINICAL DATA:  Right hip fracture. EXAM: OPERATIVE RIGHT HIP (WITH PELVIS IF PERFORMED) - 2 VIEWS TECHNIQUE: Fluoroscopic spot image(s) were submitted for interpretation post-operatively. COMPARISON:  None. FINDINGS: Two fluoroscopic spot  images show placement of 3 compression screws across the subcapital right femoral neck fracture, which is in near anatomic alignment. No evidence of dislocation. IMPRESSION: Internal fixation of right femoral neck fracture in near anatomic alignment. Electronically Signed   By: Danae Orleans M.D.   On: 02/12/2021 13:55   DG Hip Unilat With Pelvis 2-3 Views Right  Result Date: 02/11/2021 CLINICAL DATA:  Fall and right hip pain. EXAM: DG HIP (WITH OR WITHOUT PELVIS) 2-3V RIGHT COMPARISON:  Pelvic radiograph dated 03/22/2020. FINDINGS: There is an impacted subcapital fracture of the right femoral neck. CT may provide better evaluation. No dislocation. The bones are osteopenic. The soft tissues are unremarkable. IMPRESSION: Impacted subcapital fracture of the right femoral neck. Electronically Signed   By: Elgie Collard M.D.   On: 02/11/2021 23:06         Scheduled Meds:  Chlorhexidine Gluconate Cloth  6 each Topical Daily   citalopram  10 mg Oral Daily   divalproex  250 mg Oral Daily   docusate sodium  100 mg Oral BID   donepezil  10 mg Oral QHS   feeding supplement  237 mL Oral BID BM   memantine  10 mg Oral BID   mirtazapine  7.5 mg Oral QHS   multivitamin with minerals  1 tablet Oral Daily   rivaroxaban  10 mg Oral Daily   senna  1 tablet Oral BID   Continuous Infusions:   LOS: 1 day        Kathlen Mody, MD [facility]   To contact the attending provider between 7A-7P or the covering provider during after hours 7P-7A, please log into the web site [email or web address] and access using universal [place] [address] password for that web site. If you do not have the password, please call the hospital operator.  02/13/2021, 3:30 PM

## 2021-02-13 NOTE — Discharge Instructions (Addendum)

## 2021-02-13 NOTE — Plan of Care (Signed)

## 2021-02-13 NOTE — TOC Initial Note (Signed)
Transition of Care [facility]) - Initial/Assessment Note   Patient Details  Name: Rita Torres MRN: [id number] Date of Birth: 1932/05/04  Transition of Care [facility]) CM/SW Contact:    Sherie Don, LCSW Phone Number: 02/13/2021, 11:08 AM  Clinical Narrative: CSW met with patient, her son, and daughter-in-law/HCPOA, Dayami Taitt, to discuss possible PT recommendations. Patient has dementia and is currently oriented to self only. Per family, the patient is a resident at [facility] memory care unit and will be unable to return until she goes to rehab first. Family agreeable to SNF. Patient is vaccinated and boosted x2 for COVID.  PT evaluation recommended SNF. FL2 done; PASRR received. Initial referral faxed out to SNFs. TOC awaiting bed offers.  Expected Discharge Plan: Skilled Nursing Facility Barriers to Discharge: Continued Medical Work up, SNF Pending bed offer  Patient Goals and CMS Choice Patient states their goals for this hospitalization and ongoing recovery are:: Go to rehab and then return to memory care ALF CMS Medicare.gov Compare Post Acute Care list provided to:: Patient Represenative (must comment) Choice offered to / list presented to : [facility] POA / Guardian, Adult Children  Expected Discharge Plan and Services Expected Discharge Plan: [place] In-house Referral: Clinical Social Work Post Acute Care Choice: [place] Living arrangements for the past 2 months: [place]           DME Arranged: N/A DME Agency: NA  Prior Living Arrangements/Services Living arrangements for the past 2 months: [place] Lives with:: Facility Resident Patient language and need for interpreter reviewed:: Yes Do you feel safe going back to the place where you live?: Yes      Need for Family Participation in Patient Care: Yes (Comment) (Patient has dementia.) Care giver support system in place?: Yes (comment) Criminal Activity/Legal  Involvement Pertinent to Current Situation/Hospitalization: No - Comment as needed  Activities of Daily Living Home Assistive Devices/Equipment: None ADL Screening (condition at time of admission) Patient's cognitive ability adequate to safely complete daily activities?: No Is the patient deaf or have difficulty hearing?: No Does the patient have difficulty seeing, even when wearing glasses/contacts?: No Does the patient have difficulty concentrating, remembering, or making decisions?: Yes Patient able to express need for assistance with ADLs?: No Does the patient have difficulty dressing or bathing?: Yes Independently performs ADLs?: No Communication: Needs assistance Is this a change from baseline?: Pre-admission baseline Dressing (OT): Needs assistance Is this a change from baseline?: Change from baseline, expected to last >3 days Grooming: Needs assistance Is this a change from baseline?: Pre-admission baseline Feeding: Independent Bathing: Needs assistance Is this a change from baseline?: Pre-admission baseline Toileting: Needs assistance Is this a change from baseline?: Pre-admission baseline In/Out Bed: Needs assistance Is this a change from baseline?: Pre-admission baseline Walks in Home: Independent with device (comment) Does the patient have difficulty walking or climbing stairs?: Yes Weakness of Legs: Right Weakness of Arms/Hands: None  Permission Sought/Granted Permission sought to share information with : Facility Art therapist granted to share information with : Yes, Verbal Permission Granted Permission granted to share info w AGENCY: SNFs  Emotional Assessment Appearance:: Appears stated age Attitude/Demeanor/Rapport: Lethargic Affect (typically observed): Quiet Orientation: : Oriented to Self Alcohol / Substance Use: Not Applicable Psych Involvement: No (comment)  Admission diagnosis:  Closed right hip fracture, initial encounter [facility])  [S72.001A] Patient Active Problem List   Diagnosis Date Noted   Fracture of femoral neck, right, closed ([place]) 02/12/2021   Dementia  with behavioral disturbance 02/12/2021   Generalized weakness 02/12/2021   Goals of care, counseling/discussion 02/12/2021   Pressure injury of skin 02/12/2021   Mild cognitive impairment 09/14/2016   OAB (overactive bladder) 11/16/2013   Osteopenia    PCP:  Ross, Charles Alan, MD Pharmacy:   [id number] - [place], [place] - [address] [place] [address] Phone: [phone] Fax: [phone]  Readmission Risk Interventions No flowsheet data found.   

## 2021-02-13 NOTE — [place] FL2 (Signed)
[place] MEDICAID FL2 LEVEL OF CARE SCREENING TOOL     IDENTIFICATION  Patient Name: Rita Torres Birthdate: 1932-07-20 Sex: female Admission Date (Current Location): 02/11/2021  [facility] and [place] Number:  Producer, television/film/video and Address:  [facility],  [address] [place], [place] [address]      Provider Number: [id number]  Attending Physician Name and Address:  Kathlen Mody, MD  Relative Name and Phone Number:  Maleaha Hughett (daughter-in-law/POA) Ph: [phone]    Current Level of Care: Hospital Recommended Level of Care: Skilled Nursing Facility Prior Approval Number:    Date Approved/Denied:   PASRR Number: [id number]  Discharge Plan: SNF    Current Diagnoses: Patient Active Problem List   Diagnosis Date Noted   Fracture of femoral neck, right, closed (HCC) 02/12/2021   Dementia with behavioral disturbance 02/12/2021   Generalized weakness 02/12/2021   Goals of care, counseling/discussion 02/12/2021   Pressure injury of skin 02/12/2021   Mild cognitive impairment 09/14/2016   OAB (overactive bladder) 11/16/2013   Osteopenia     Orientation RESPIRATION BLADDER Height & Weight     Self  Normal Incontinent (Occasional incontinence) Weight: 121 lb 0.5 oz (54.9 kg) Height:  5\' 5"  (165.1 cm)  BEHAVIORAL SYMPTOMS/MOOD NEUROLOGICAL BOWEL NUTRITION STATUS   (Patient is oriented to person only and can become agitated with staff as a result.)  (N/A) Continent Diet (Regular diet)  AMBULATORY STATUS COMMUNICATION OF NEEDS Skin   Extensive Assist Verbally Surgical wounds, Other (Comment) (Eccyhmosis: hip, knee, leg, sacrum)                       Personal Care Assistance Level of Assistance  Bathing, Feeding, Dressing Bathing Assistance: Maximum assistance Feeding assistance: Limited assistance Dressing Assistance: Maximum assistance     Functional Limitations Info  Sight, Hearing, Speech Sight Info: Impaired Hearing Info: Impaired Speech  Info: Adequate    SPECIAL CARE FACTORS FREQUENCY  PT (By licensed PT), OT (By licensed OT)     PT Frequency: 5x's/week OT Frequency: 5x's/week            Contractures Contractures Info: Not present    Additional Factors Info  Code Status, Allergies, Psychotropic Code Status Info: DNR Allergies Info: Penicillins Psychotropic Info: Celexa (citalopram), Remeron (mirtazapine), Depakote         Current Medications (02/13/2021):  This is the current hospital active medication list Current Facility-Administered Medications  Medication Dose Route Frequency Provider Last Rate Last Admin   acetaminophen (TYLENOL) tablet 325-650 mg  325-650 mg Oral Q6H PRN 13/10/2020 K, PA-C       acetaminophen (TYLENOL) tablet 500 mg  500 mg Oral Q6H Brown, Blaine K, PA-C   500 mg at 02/13/21 0516   alum & mag hydroxide-simeth (MAALOX/MYLANTA) 200-200-20 MG/5ML suspension 30 mL  30 mL Oral Q4H PRN 10-16-2000 K, PA-C       bisacodyl (DULCOLAX) suppository 10 mg  10 mg Rectal Daily PRN Janine Ores, Blaine K, PA-C       Chlorhexidine Gluconate Cloth 2 % PADS 6 each  6 each Topical Daily 04-28-1974, MD       citalopram (CELEXA) tablet 10 mg  10 mg Oral Daily Kathlen Mody K, PA-C   10 mg at 02/13/21 0806   divalproex (DEPAKOTE ER) 24 hr tablet 250 mg  250 mg Oral Daily 13/07/22 K, PA-C   250 mg at 02/13/21 0806   docusate sodium (COLACE) capsule 100 mg  100  mg Oral BID Armida Sans, PA-C   100 mg at 02/13/21 0806   donepezil (ARICEPT) tablet 10 mg  10 mg Oral QHS Janine Ores K, PA-C   10 mg at 02/13/21 0050   feeding supplement (ENSURE SURGERY) liquid 237 mL  237 mL Oral BID BM Janine Ores K, PA-C   237 mL at 02/13/21 0815   HYDROcodone-acetaminophen (NORCO/VICODIN) 5-325 MG per tablet 1 tablet  1 tablet Oral Q4H PRN Janine Ores K, PA-C   1 tablet at 02/13/21 0948   memantine (NAMENDA) tablet 10 mg  10 mg Oral BID Janine Ores K, PA-C   10 mg at 02/13/21 2947   menthol-cetylpyridinium  (CEPACOL) lozenge 3 mg  1 lozenge Oral PRN Janine Ores K, PA-C       Or   phenol (CHLORASEPTIC) mouth spray 1 spray  1 spray Mouth/Throat PRN Janine Ores K, PA-C       mirtazapine (REMERON) tablet 7.5 mg  7.5 mg Oral QHS Manson Passey, Blaine K, PA-C   7.5 mg at 02/13/21 0051   morphine 2 MG/ML injection 0.5-1 mg  0.5-1 mg Intravenous Q2H PRN Janine Ores K, PA-C   1 mg at 02/12/21 1825   multivitamin with minerals tablet 1 tablet  1 tablet Oral Daily Janine Ores K, PA-C   1 tablet at 02/13/21 0806   ondansetron (ZOFRAN) tablet 4 mg  4 mg Oral Q6H PRN Janine Ores K, PA-C       Or   ondansetron [facility]) injection 4 mg  4 mg Intravenous Q6H PRN Janine Ores K, PA-C       polyethylene glycol (MIRALAX / GLYCOLAX) packet 17 g  17 g Oral Daily PRN Manson Passey, Blaine K, PA-C       rivaroxaban Carlena Hurl) tablet 10 mg  10 mg Oral Daily Janine Ores K, PA-C   10 mg at 02/13/21 6546   senna (SENOKOT) tablet 8.6 mg  1 tablet Oral BID Janine Ores K, PA-C   8.6 mg at 02/13/21 0806   sodium phosphate (FLEET) 7-19 GM/118ML enema 1 enema  1 enema Rectal Once PRN Armida Sans, PA-C         Discharge Medications: Please see discharge summary for a list of discharge medications.  Relevant Imaging Results:  Relevant Lab Results:   Additional Information SSN: [id number]  Ewing Schlein, LCSW

## 2021-02-13 NOTE — Progress Notes (Signed)
Subjective: Patient has dementia and is thus a poor historian. Very sleepy. Reports pain as mild to moderate. Tolerating diet but not eating much other than Ensure. Urinating. No CP, SOB. Confused and trying to get OOB on her own this morning. Worked with PT this morning but was reluctant to mobilize OOB.  Objective:   VITALS:   Vitals:   02/13/21 0130 02/13/21 0459 02/13/21 0757 02/13/21 1345  BP: (!) 143/70 129/86 (!) 153/64 (!) 145/129  Pulse: 81 71 77 78  Resp: 16 16 17 16   Temp: 99 F (37.2 C) 98 F (36.7 C) 97.8 F (36.6 C) 98.8 F (37.1 C)  TempSrc: Oral Oral Oral   SpO2: 100% 100% 97% 96%  Weight:      Height:       CBC Latest Ref Rng & Units 02/13/2021 02/12/2021 02/11/2021  WBC 4.0 - 10.5 K/uL 6.4 5.2 7.8  Hemoglobin 12.0 - 15.0 g/dL 9.0(L) 9.4(L) 10.0(L)  Hematocrit 36.0 - 46.0 % 29.3(L) 30.3(L) 31.9(L)  Platelets 150 - 400 K/uL 121(L) 133(L) 134(L)   BMP Latest Ref Rng & Units 02/13/2021 02/12/2021 02/11/2021  Glucose 70 - 99 mg/dL 13/08/2020) 742(V) 956(L)  BUN 8 - 23 mg/dL 21 875(I) 43(P)  Creatinine 0.44 - 1.00 mg/dL 29(J 1.88 4.16  BUN/Creat Ratio 6 - 22 (calc) - - -  Sodium 135 - 145 mmol/L 139 140 142  Potassium 3.5 - 5.1 mmol/L 4.1 3.9 4.2  Chloride 98 - 111 mmol/L 105 108 106  CO2 22 - 32 mmol/L 28 28 30   Calcium 8.9 - 10.3 mg/dL 8.3(L) 8.6(L) 8.7(L)   Intake/Output      11/06 0701 11/07 0700 11/07 0701 11/08 0700   P.O. 120 240   I.V. (mL/kg) 1325 (24.1)    IV Piggyback 100    Total Intake(mL/kg) 1545 (28.1) 240 (4.4)   Urine (mL/kg/hr) 1450 (1.1)    Blood 50    Total Output 1500    Net +45 +240           Physical Exam: General: NAD. Asleep. Very hard to arouse. Speaking in gibberish Resp: No increased wob Cardio: regular rate and rhythm ABD soft Neurologically intact MSK Neurovascularly intact Sensation intact distally Intact pulses distally Dorsiflexion/Plantar flexion intact Incision: dressing C/D/I Ecchymosis around surgical  site   Assessment: 1 Day Post-Op  S/P Procedure(s) (LRB): CANNULATED HIP PINNING (Right) by Dr. 13/07. 13/08 on 02/12/21  Principal Problem:   Fracture of femoral neck, right, closed (HCC) Active Problems:   Dementia with behavioral disturbance   Generalized weakness   Goals of care, counseling/discussion   Pressure injury of skin   Plan: Monitor to ensure patient doesn't leave bed alone to minimize fall risk  I think the Norco is too strong for her as she's been very somnolent since getting a dose this morning. I'm going to change her to Tramadol PRN. Advance diet Up with therapy Incentive Spirometry Elevate and Apply ice  Weightbearing: WBAT RLE Insicional and dressing care: Dressings left intact until follow-up and Reinforce dressings as needed Orthopedic device(s): None Showering: Keep dressing dry VTE prophylaxis:  Xarelto 10mg  daily x 30 days post-op , SCDs, ambulation Pain control: Change Norco to Tramadol. Minimize narcotics due to dementia Follow - up plan: 2 weeks post op Contact information:  Eulah Pont MD, 13/6/22 PA-C  Dispo: PT recommending Skilled Nursing Facility/Rehab before being able to return to the memory care facility.     , PA-C Office  [phone] 02/13/2021, 1:52 PM

## 2021-02-13 NOTE — Evaluation (Signed)
Physical Therapy Evaluation Patient Details Name: Rita Torres MRN: [id number] DOB: [birth year] Today's Date: 02/13/2021  History of Present Illness  87 yo female admitted after sustaining fx after falling at ALF/memory care facility. S/P cannulated R hip pinning 11/6. Hx of dementia.  Clinical Impression  On eval, pt required Mod-Max assist +2 for mobility.  She sat EOB for ~ 5 minutes with Min guard assist. Pt was resistant to attempts at standing despite cues/encouragement. Pain appeared controlled during session. Assisted pt back to bed. Will plan to follow and progress activity as tolerated. PT recommendation is for SNF unless ALF/memory care is able to provide current level of assistance.      Recommendations for follow up therapy are one component of a multi-disciplinary discharge planning process, led by the attending physician.  Recommendations may be updated based on patient status, additional functional criteria and insurance authorization.  Follow Up Recommendations Skilled nursing-short term rehab (<3 hours/day)    Assistance Recommended at Discharge Frequent or constant Supervision/Assistance  Functional Status Assessment Patient has had a recent decline in their functional status and demonstrates the ability to make significant improvements in function in a reasonable and predictable amount of time.  Equipment Recommendations  Rolling walker (2 wheels)    Recommendations for Other Services       Precautions / Restrictions Precautions Precautions: Fall Restrictions Weight Bearing Restrictions: No RLE Weight Bearing: Weight bearing as tolerated      Mobility  Bed Mobility Overal bed mobility: Needs Assistance Bed Mobility: Supine to Sit;Sit to Supine     Supine to sit: Max assist;+2 for physical assistance;+2 for safety/equipment;HOB elevated Sit to supine: Mod assist;+2 for physical assistance;+2 for safety/equipment   General bed mobility comments: Multimodal  cueing required. Pt would not initiate supine to sit but she did initiate sit to supine with time. Assist for trunk and bil LEs. Utilized bedpad to aid with scooting, positioning. Min guard for sitting balance.    Transfers                   General transfer comment: Pt resistant to attempt to stand    Ambulation/Gait                  Stairs            Wheelchair Mobility    Modified Rankin (Stroke Patients Only)       Balance Overall balance assessment: Needs assistance;History of Falls Sitting-balance support: No upper extremity supported;Feet supported Sitting balance-Leahy Scale: Fair Sitting balance - Comments: Sat EOB for ~ 5 mins with Min guard assist.     Standing balance-Leahy Scale: Poor                               Pertinent Vitals/Pain Pain Assessment: Faces Faces Pain Scale: Hurts little more Breathing: normal Negative Vocalization: occasional moan/groan, low speech, negative/disapproving quality Facial Expression: facial grimacing Body Language: tense, distressed pacing, fidgeting Consolability: no need to console PAINAD Score: 4 Pain Location: R hip Pain Intervention(s): Monitored during session;Limited activity within patient's tolerance;Repositioned    Home Living Family/patient expects to be discharged to:: Skilled nursing facility     Type of Home: Assisted living Home Access: Level entry       Home Layout: One level        Prior Function               Mobility Comments: per  chart, pt was ambulatory at ALF/memory care       Hand Dominance        Extremity/Trunk Assessment   Upper Extremity Assessment Upper Extremity Assessment: Generalized weakness    Lower Extremity Assessment Lower Extremity Assessment: Generalized weakness    Cervical / Trunk Assessment Cervical / Trunk Assessment: Kyphotic  Communication   Communication: No difficulties  Cognition Arousal/Alertness:  Awake/alert Behavior During Therapy: WFL for tasks assessed/performed Overall Cognitive Status: History of cognitive impairments - at baseline                                 General Comments: pt tends to keep eyes closed        General Comments      Exercises     Assessment/Plan    PT Assessment Patient needs continued PT services  PT Problem List Decreased strength;Decreased mobility;Decreased cognition;Decreased range of motion;Decreased activity tolerance;Decreased balance;Decreased knowledge of use of DME;Pain       PT Treatment Interventions DME instruction;Gait training;Therapeutic activities;Therapeutic exercise;Patient/family education;Balance training;Functional mobility training    PT Goals (Current goals can be found in the Care Plan section)  Acute Rehab PT Goals Patient Stated Goal: pt unable to state PT Goal Formulation: Patient unable to participate in goal setting Time For Goal Achievement: 02/27/21 Potential to Achieve Goals: Good    Frequency Min 2X/week   Barriers to discharge        Co-evaluation               AM-PAC PT "6 Clicks" Mobility  Outcome Measure Help needed turning from your back to your side while in a flat bed without using bedrails?: Total Help needed moving from lying on your back to sitting on the side of a flat bed without using bedrails?: Total Help needed moving to and from a bed to a chair (including a wheelchair)?: Total Help needed standing up from a chair using your arms (e.g., wheelchair or bedside chair)?: Total Help needed to walk in hospital room?: Total Help needed climbing 3-5 steps with a railing? : Total 6 Click Score: 6    End of Session   Activity Tolerance: Patient tolerated treatment well Patient left: in bed;with call bell/phone within reach;with bed alarm set   PT Visit Diagnosis: History of falling (Z91.81);Muscle weakness (generalized) (M62.81);Other abnormalities of gait and mobility  (R26.89)    Time: 6004-5997 PT Time Calculation (min) (ACUTE ONLY): 11 min   Charges:   PT Evaluation $PT Eval Moderate Complexity: 1 Mod             Faye Ramsay, PT Acute Rehabilitation  Office: [phone] Pager: [phone]

## 2021-02-14 DIAGNOSIS — R531 Weakness: Secondary | ICD-10-CM | POA: Diagnosis not present

## 2021-02-14 DIAGNOSIS — S72001D Fracture of unspecified part of neck of right femur, subsequent encounter for closed fracture with routine healing: Secondary | ICD-10-CM | POA: Diagnosis not present

## 2021-02-14 DIAGNOSIS — F03918 Unspecified dementia, unspecified severity, with other behavioral disturbance: Secondary | ICD-10-CM | POA: Diagnosis not present

## 2021-02-14 LAB — CBC
HCT: 31.2 % — ABNORMAL LOW (ref 36.0–46.0)
Hemoglobin: 9.7 g/dL — ABNORMAL LOW (ref 12.0–15.0)
MCH: 31.2 pg (ref 26.0–34.0)
MCHC: 31.1 g/dL (ref 30.0–36.0)
MCV: 100.3 fL — ABNORMAL HIGH (ref 80.0–100.0)
Platelets: 119 10*3/uL — ABNORMAL LOW (ref 150–400)
RBC: 3.11 MIL/uL — ABNORMAL LOW (ref 3.87–5.11)
RDW: 13.8 % (ref 11.5–15.5)
WBC: 5.4 10*3/uL (ref 4.0–10.5)
nRBC: 0 % (ref 0.0–0.2)

## 2021-02-14 LAB — BASIC METABOLIC PANEL
Anion gap: 7 (ref 5–15)
BUN: 21 mg/dL (ref 8–23)
CO2: 27 mmol/L (ref 22–32)
Calcium: 8.6 mg/dL — ABNORMAL LOW (ref 8.9–10.3)
Chloride: 106 mmol/L (ref 98–111)
Creatinine, Ser: 0.71 mg/dL (ref 0.44–1.00)
GFR, Estimated: >60 mL/min (ref >60)
Glucose, Bld: 100 mg/dL — ABNORMAL HIGH (ref 70–99)
Potassium: 4.8 mmol/L (ref 3.5–5.1)
Sodium: 140 mmol/L (ref 135–145)

## 2021-02-14 LAB — VITAMIN B12: Vitamin B-12: 247 pg/mL (ref 180–914)

## 2021-02-14 LAB — AMMONIA: Ammonia: 28 umol/L (ref 9–35)

## 2021-02-14 LAB — TSH: TSH: 4.702 u[IU]/mL — ABNORMAL HIGH (ref 0.350–4.500)

## 2021-02-14 MED ORDER — SENNA 8.6 MG PO TABS: 1.0000 | ORAL_TABLET | Freq: Two times a day (BID) | ORAL | 0 refills | Status: AC

## 2021-02-14 MED ORDER — ENSURE SURGERY PO LIQD: 237.0000 mL | Freq: Two times a day (BID) | ORAL | Status: DC

## 2021-02-14 MED ORDER — POLYETHYLENE GLYCOL 3350 17 G PO PACK
17.0000 g | PACK | Freq: Every day | ORAL | 0 refills | Status: DC | PRN
Start: 2021-02-14 — End: 2021-04-22

## 2021-02-14 MED ORDER — VITAMIN D 25 MCG (1000 UNIT) PO TABS
1000.0000 [IU] | ORAL_TABLET | Freq: Every day | ORAL | Status: DC
  Administered 2021-02-14 – 2021-02-17 (×3): 1000 [IU] via ORAL
  Filled 2021-02-14 (×3): qty 1

## 2021-02-14 MED ORDER — DOCUSATE SODIUM 100 MG PO CAPS: 100.0000 mg | ORAL_CAPSULE | Freq: Two times a day (BID) | ORAL | 0 refills | Status: AC

## 2021-02-14 MED ORDER — ADULT MULTIVITAMIN W/MINERALS CH: 1.0000 | ORAL_TABLET | Freq: Every day | ORAL | Status: AC

## 2021-02-14 MED ORDER — RIVAROXABAN 10 MG PO TABS: 10.0000 mg | ORAL_TABLET | Freq: Every day | ORAL | 0 refills | Status: DC

## 2021-02-14 NOTE — Progress Notes (Signed)
Physical Therapy Treatment Patient Details Name: Rita Torres MRN: [id number] DOB: 16-Aug-1932 Today's Date: 02/14/2021   History of Present Illness 87 yo female admitted after sustaining fx after falling at ALF/memory care facility. S/P cannulated R hip pinning 11/6. Hx of dementia.    PT Comments    Patient made good progress today and was able to rise/sit at recliner with min assist and tactile cues for safe hand placement. Patient took several small steps forward with RW and 2+ mod assist for safety and to advance Rt LE greater step length in RW. She will continue to benefit from skilled PT at SNF setting to progress mobility and independence. Acute PT will progress as able.    Recommendations for follow up therapy are one component of a multi-disciplinary discharge planning process, led by the attending physician.  Recommendations may be updated based on patient status, additional functional criteria and insurance authorization.  Follow Up Recommendations  Skilled nursing-short term rehab (<3 hours/day)     Assistance Recommended at Discharge Frequent or constant Supervision/Assistance  Equipment Recommendations  Rolling walker (2 wheels)    Recommendations for Other Services       Precautions / Restrictions Precautions Precautions: Fall Restrictions Weight Bearing Restrictions: No RLE Weight Bearing: Weight bearing as tolerated     Mobility  Bed Mobility               General bed mobility comments: pt OOB in recliner    Transfers Overall transfer level: Needs assistance Equipment used: Rolling walker (2 wheels) Transfers: Sit to/from Stand Sit to Stand: Min assist;+2 safety/equipment                Ambulation/Gait Ambulation/Gait assistance: +2 safety/equipment;Mod assist Gait Distance (Feet): 7 Feet Assistive device: Rolling walker (2 wheels) Gait Pattern/deviations: Step-to pattern;Decreased step length - right;Decreased step length -  left;Decreased stride length;Shuffle Gait velocity: decr     General Gait Details: pt took several small steps forward in room with mod assist for walker position and balance. Tactile cue at back for upright posture and assist for Rt LE advancement.   Stairs             Wheelchair Mobility    Modified Rankin (Stroke Patients Only)       Balance Overall balance assessment: Needs assistance;History of Falls Sitting-balance support: No upper extremity supported;Feet supported Sitting balance-Leahy Scale: Fair     Standing balance support: Bilateral upper extremity supported;During functional activity;Reliant on assistive device for balance Standing balance-Leahy Scale: Poor Standing balance comment: heavy reliance on RW and external support of therapist                            Cognition Arousal/Alertness: Awake/alert Behavior During Therapy: WFL for tasks assessed/performed Overall Cognitive Status: History of cognitive impairments - at baseline                                 General Comments: pt tends to keep eyes closed        Exercises      General Comments        Pertinent Vitals/Pain Pain Assessment: PAINAD Breathing: normal Negative Vocalization: none Facial Expression: smiling or inexpressive Body Language: relaxed Consolability: distracted or reassured by voice/touch PAINAD Score: 1 Pain Location: R hip Pain Intervention(s): Limited activity within patient's tolerance;Monitored during session;Repositioned    Home Living  Prior Function            PT Goals (current goals can now be found in the care plan section) Acute Rehab PT Goals Patient Stated Goal: pt unable to state PT Goal Formulation: Patient unable to participate in goal setting Time For Goal Achievement: 02/27/21 Potential to Achieve Goals: Good Progress towards PT goals: Progressing toward goals     Frequency    Min 2X/week      PT Plan Current plan remains appropriate    Co-evaluation              AM-PAC PT "6 Clicks" Mobility   Outcome Measure  Help needed turning from your back to your side while in a flat bed without using bedrails?: Total Help needed moving from lying on your back to sitting on the side of a flat bed without using bedrails?: Total Help needed moving to and from a bed to a chair (including a wheelchair)?: Total Help needed standing up from a chair using your arms (e.g., wheelchair or bedside chair)?: Total Help needed to walk in hospital room?: Total Help needed climbing 3-5 steps with a railing? : Total 6 Click Score: 6    End of Session Equipment Utilized During Treatment: Gait belt Activity Tolerance: Patient tolerated treatment well Patient left: in bed;with call bell/phone within reach;with chair alarm set Nurse Communication: Mobility status PT Visit Diagnosis: History of falling (Z91.81);Muscle weakness (generalized) (M62.81);Other abnormalities of gait and mobility (R26.89)     Time: 4270-6237 PT Time Calculation (min) (ACUTE ONLY): 15 min  Charges:  $Gait Training: 8-22 mins                     Wynn Maudlin, DPT Acute Rehabilitation Services Office [phone] Pager [phone]    Anitra Lauth 02/14/2021, 4:37 PM

## 2021-02-14 NOTE — Progress Notes (Signed)
PROGRESS NOTE    Rita Torres  BPZ:[id number] DOB: Apr 16, 1932 DOA: 02/11/2021 PCP: Daisy Floro, MD   Chief Complaint  Patient presents with   Fall    Brief Narrative:   87 year old female with past medical history of dementia with behavioral disturbance who presents to [facility] long hospital emergency department via EMS from [facility] memory care facility after sustaining a fall.Marland Kitchen He was found to have  impacted subcapital fracture of the right femoral neck. Orthopedics consulted and she underwent surgical repair.   Pt seen and examined at bedside. She is sound asleep and snoring.   Assessment & Plan:   Principal Problem:   Fracture of femoral neck, right, closed (HCC) Active Problems:   Dementia with behavioral disturbance   Generalized weakness   Goals of care, counseling/discussion   Pressure injury of skin   Fracture of the femoral neck on the right S/p surgical repair Pain control Therapy evaluations recommending SNF. TOC on board. Xarelto for 30 days for DVT prophylaxis.    Acute metabolic encephalopathy superimposed on underlying dementia.  - pt is sound asleep, probably from pain meds.  - stop the IV metoprolol.   Dementia  Patient resides at memory care unit No agitation at this time Continue with home medications at this time.  Acute anemia of blood loss secondary to the procedure anemia of chronic disease Hemoglobin on admission around 10 and dropped to 9 today probably from the surgery. Transfuse to keep hemoglobin greater than 7. Hemoglobin stable around 9.    Vitamin D deficiency:  Vitamin D supplementation added.     Pressure Injury: Pressure Injury 02/12/21 Knee Anterior;Left Stage 1 -  Intact skin with non-blanchable redness of a localized area usually over a bony prominence. red (Active)  02/12/21 0120  Location: Knee  Location Orientation: Anterior;Left  Staging: Stage 1 -  Intact skin with non-blanchable redness of a  localized area usually over a bony prominence.  Wound Description (Comments): red  Present on Admission: Yes    Mild thrombocytopenia:  No signs of bleeding, monitor.      DVT prophylaxis: (xarelto.) Code Status: (DNR) Family Communication: none at bedside.  Disposition:   Status is: Inpatient  Remains inpatient appropriate because: unsafe d/c plan        Consultants:  Orthopedics.   Procedures: Cannulated hip pinning of the right hip by Dr Eulah Pont Antimicrobials: None   Subjective: Somnolent.   Objective: Vitals:   02/13/21 1345 02/13/21 1458 02/13/21 2014 02/14/21 0459  BP: (!) 145/129 (!) 99/47 129/62 (!) 154/68  Pulse: 78 85 80 76  Resp: 16  16 16   Temp: 98.8 F (37.1 C)  98.4 F (36.9 C) 98.4 F (36.9 C)  TempSrc: Oral  Oral Oral  SpO2: 96%  93% 98%  Weight:      Height:        Intake/Output Summary (Last 24 hours) at 02/14/2021 1226 Last data filed at 02/14/2021 1019 Gross per 24 hour  Intake 320 ml  Output 150 ml  Net 170 ml    Filed Weights   02/11/21 2149 02/12/21 1006  Weight: 68 kg 54.9 kg    Examination:  General exam: elderly lady not in distress, sleeping soundly.  Respiratory system: Clear to auscultation. Respiratory effort normal. Cardiovascular system: S1 & S2 heard, RRR. No JVD,no pedal edema.  Gastrointestinal system: Abdomen is nondistended, soft and nontender.Normal bowel sounds heard. Central nervous system: somnolent. Able to  move all extremities.  Extremities: Symmetric 5 x  5 power. Skin: No rashes seen.  Psychiatry: unable to assess      Data Reviewed: I have personally reviewed following labs and imaging studies  CBC: Recent Labs  Lab 02/11/21 2250 02/12/21 0322 02/13/21 0323 02/14/21 0321  WBC 7.8 5.2 6.4 5.4  NEUTROABS 6.2 3.3  --   --   HGB 10.0* 9.4* 9.0* 9.7*  HCT 31.9* 30.3* 29.3* 31.2*  MCV 99.7 98.7 99.0 100.3*  PLT 134* 133* 121* 119*     Basic Metabolic Panel: Recent Labs  Lab  02/11/21 2250 02/12/21 0322 02/13/21 0323 02/14/21 0321  NA 142 140 139 140  K 4.2 3.9 4.1 4.8  CL 106 108 105 106  CO2 30 28 28 27   GLUCOSE 118* 107* 122* 100*  BUN 30* 26* 21 21  CREATININE 0.68 0.56 0.70 0.71  CALCIUM 8.7* 8.6* 8.3* 8.6*     GFR: Estimated Creatinine Clearance: 43.7 mL/min (by C-G formula based on SCr of 0.71 mg/dL).  Liver Function Tests: Recent Labs  Lab 02/12/21 0322  AST 19  ALT 14  ALKPHOS 147*  BILITOT 0.6  PROT 5.6*  ALBUMIN 2.9*     CBG: No results for input(s): GLUCAP in the last 168 hours.   Recent Results (from the past 240 hour(s))  Resp Panel by RT-PCR (Flu A&B, Covid) Nasopharyngeal Swab     Status: None   Collection Time: 02/12/21 12:05 AM   Specimen: Nasopharyngeal Swab; Nasopharyngeal(NP) swabs in vial transport medium  Result Value Ref Range Status   SARS Coronavirus 2 by RT PCR NEGATIVE NEGATIVE Final    Comment: (NOTE) SARS-CoV-2 target nucleic acids are NOT DETECTED.  The SARS-CoV-2 RNA is generally detectable in upper respiratory specimens during the acute phase of infection. The lowest concentration of SARS-CoV-2 viral copies this assay can detect is 138 copies/mL. A negative result does not preclude SARS-Cov-2 infection and should not be used as the sole basis for treatment or other patient management decisions. A negative result may occur with  improper specimen collection/handling, submission of specimen other than nasopharyngeal swab, presence of viral mutation(s) within the areas targeted by this assay, and inadequate number of viral copies(<138 copies/mL). A negative result must be combined with clinical observations, patient history, and epidemiological information. The expected result is Negative.  Fact Sheet for Patients:  13/06/22  Fact Sheet for Healthcare Providers:  BloggerCourse.com  This test is no t yet approved or cleared by the [employer] FDA and  has been authorized for detection and/or diagnosis of SARS-CoV-2 by FDA under an Emergency Use Authorization (EUA). This EUA will remain  in effect (meaning this test can be used) for the duration of the COVID-19 declaration under Section 564(b)(1) of the Act, 21 U.S.C.section 360bbb-3(b)(1), unless the authorization is terminated  or revoked sooner.       Influenza A by PCR NEGATIVE NEGATIVE Final   Influenza B by PCR NEGATIVE NEGATIVE Final    Comment: (NOTE) The Xpert Xpress SARS-CoV-2/FLU/RSV plus assay is intended as an aid in the diagnosis of influenza from Nasopharyngeal swab specimens and should not be used as a sole basis for treatment. Nasal washings and aspirates are unacceptable for Xpert Xpress SARS-CoV-2/FLU/RSV testing.  Fact Sheet for Patients: [place]  Fact Sheet for Healthcare Providers: BloggerCourse.com  This test is not yet approved or cleared by the [employer].[place] FDA and has been authorized for detection and/or diagnosis of SARS-CoV-2 by FDA under an Emergency Use Authorization (EUA). This EUA will remain in effect (  meaning this test can be used) for the duration of the COVID-19 declaration under Section 564(b)(1) of the Act, 21 U.S.C. section 360bbb-3(b)(1), unless the authorization is terminated or revoked.  Performed at [facility], [address], [place], [place] [address]   Urine Culture     Status: Abnormal   Collection Time: 02/12/21  9:08 AM   Specimen: Urine, Clean Catch  Result Value Ref Range Status   Specimen Description   Final    URINE, CLEAN CATCH Performed at [facility], [address], [place], [place] [address]    Special Requests   Final    NONE Performed at [facility], [address], [place], [place] [address]    Culture MULTIPLE SPECIES PRESENT, SUGGEST RECOLLECTION (A)  Final   Report  Status 02/13/2021 FINAL  Final  Surgical pcr screen     Status: None   Collection Time: 02/12/21 10:10 AM   Specimen: Nasal Mucosa; Nasal Swab  Result Value Ref Range Status   MRSA, PCR NEGATIVE NEGATIVE Final   Staphylococcus aureus NEGATIVE NEGATIVE Final    Comment: (NOTE) The Xpert SA Assay (FDA approved for NASAL specimens in patients 42 years of age and older), is one component of a comprehensive surveillance program. It is not intended to diagnose infection nor to guide or monitor treatment. Performed at [facility], [address] [place], [place] [address]           Radiology Studies: DG C-Arm 1-60 Min-No Report  Result Date: 02/12/2021 Fluoroscopy was utilized by the requesting physician.  No radiographic interpretation.   DG HIP OPERATIVE UNILAT W OR W/O PELVIS RIGHT  Result Date: 02/12/2021 CLINICAL DATA:  Right hip fracture. EXAM: OPERATIVE RIGHT HIP (WITH PELVIS IF PERFORMED) - 2 VIEWS TECHNIQUE: Fluoroscopic spot image(s) were submitted for interpretation post-operatively. COMPARISON:  None. FINDINGS: Two fluoroscopic spot images show placement of 3 compression screws across the subcapital right femoral neck fracture, which is in near anatomic alignment. No evidence of dislocation. IMPRESSION: Internal fixation of right femoral neck fracture in near anatomic alignment. Electronically Signed   By: Danae Orleans M.D.   On: 02/12/2021 13:55        Scheduled Meds:  Chlorhexidine Gluconate Cloth  6 each Topical Daily   citalopram  10 mg Oral Daily   divalproex  250 mg Oral Daily   docusate sodium  100 mg Oral BID   donepezil  10 mg Oral QHS   feeding supplement  237 mL Oral BID BM   memantine  10 mg Oral BID   mirtazapine  7.5 mg Oral QHS   multivitamin with minerals  1 tablet Oral Daily   rivaroxaban  10 mg Oral Daily   senna  1 tablet Oral BID   Continuous Infusions:   LOS: 2 days        Kathlen Mody, MD [facility]   To contact the attending provider between 7A-7P or the covering provider during after hours 7P-7A, please log into the web site [email or web address] and access using universal [place] password for that web site. If you do not have the password, please call the hospital operator.  02/14/2021, 12:26 PM

## 2021-02-14 NOTE — TOC Progression Note (Signed)
Transition of Care [facility]) - Progression Note   Patient Details  Name: DRAKE LANDING MRN: [id number] Date of Birth: 07/16/1932  Transition of Care [facility]) CM/SW Contact  Ewing Schlein, LCSW Phone Number: 02/14/2021, 1:07 PM  Clinical Narrative: Patient was denied by several SNFs due to behaviors associated with patient's dementia and requiring too much care/assistance as she has not ambulated with PT yet. Patient will need a second PT note in order to start insurance authorization, otherwise patient's case will go to peer-to-peer. CSW provided update to patient's son. Son aware that the bed search may need to be expanded due to the lack of bed offers in part because of the patient's dementia. TOC awaiting second PT note.  Expected Discharge Plan: Skilled Nursing Facility Barriers to Discharge: Continued Medical Work up, SNF Pending bed offer  Expected Discharge Plan and Services Expected Discharge Plan: Skilled Nursing Facility In-house Referral: Clinical Social Work Post Acute Care Choice: Skilled Nursing Facility Living arrangements for the past 2 months: Assisted Living Facility Expected Discharge Date: 02/14/21               DME Arranged: N/A DME Agency: NA  Readmission Risk Interventions No flowsheet data found.

## 2021-02-14 NOTE — Progress Notes (Signed)
    Subjective: Patient has dementia and is thus a poor historian. Very sleepy. More so than yesterday. Did wake up with a hard sternal rub. Was then agitated and fighting me. Still not eating much other than Ensure. Urinating. No CP, SOB. Family at bedside concerned with her decline this morning.  Objective:   VITALS:   Vitals:   02/13/21 1345 02/13/21 1458 02/13/21 2014 02/14/21 0459  BP: (!) 145/129 (!) 99/47 129/62 (!) 154/68  Pulse: 78 85 80 76  Resp: 16  16 16   Temp: 98.8 F (37.1 C)  98.4 F (36.9 C) 98.4 F (36.9 C)  TempSrc: Oral  Oral Oral  SpO2: 96%  93% 98%  Weight:      Height:       CBC Latest Ref Rng & Units 02/14/2021 02/13/2021 02/12/2021  WBC 4.0 - 10.5 K/uL 5.4 6.4 5.2  Hemoglobin 12.0 - 15.0 g/dL 13/09/2020) 8.1(O) 1.7(P)  Hematocrit 36.0 - 46.0 % 31.2(L) 29.3(L) 30.3(L)  Platelets 150 - 400 K/uL 119(L) 121(L) 133(L)   BMP Latest Ref Rng & Units 02/14/2021 02/13/2021 02/12/2021  Glucose 70 - 99 mg/dL 13/09/2020) 585(I) 778(E)  BUN 8 - 23 mg/dL 21 21 423(N)  Creatinine 0.44 - 1.00 mg/dL 36(R 4.43 1.54  BUN/Creat Ratio 6 - 22 (calc) - - -  Sodium 135 - 145 mmol/L 140 139 140  Potassium 3.5 - 5.1 mmol/L 4.8 4.1 3.9  Chloride 98 - 111 mmol/L 106 105 108  CO2 22 - 32 mmol/L 27 28 28   Calcium 8.9 - 10.3 mg/dL 0.08) 8.3(L) 8.6(L)   Intake/Output      11/07 0701 11/08 0700 11/08 0701 11/09 0700   P.O. 510    I.V. (mL/kg)     IV Piggyback     Total Intake(mL/kg) 510 (9.3)    Urine (mL/kg/hr) 150 (0.1)    Blood     Total Output 150    Net +360         Urine Occurrence 1 x       Physical Exam: General: NAD. Flat in bed. Head extended. Mouth open. Asleep. Very hard to arouse. Speaking in gibberish Resp: No increased wob Cardio: regular rate and rhythm ABD soft Neurologically intact MSK Neurovascularly intact Sensation intact distally Intact pulses distally Dorsiflexion/Plantar flexion intact Incision: dressing C/D/I Ecchymosis around surgical  site   Assessment: 2 Days Post-Op  S/P Procedure(s) (LRB): CANNULATED HIP PINNING (Right) by Dr. 13/08. 13/09 on 02/12/21  Principal Problem:   Fracture of femoral neck, right, closed (HCC) Active Problems:   Dementia with behavioral disturbance   Generalized weakness   Goals of care, counseling/discussion   Pressure injury of skin   Plan: Monitor to ensure patient doesn't leave bed alone to minimize fall risk  Minimize agitation and lethargy  Advance diet Up with therapy Incentive Spirometry Elevate and Apply ice  Weightbearing: WBAT RLE Insicional and dressing care: Dressings left intact until follow-up and Reinforce dressings as needed Orthopedic device(s): None Showering: Keep dressing dry VTE prophylaxis:  Xarelto 10mg  daily x 30 days post-op , SCDs, ambulation Pain control: Tylenol and Tramadol PRN. Minimize narcotics due to dementia Follow - up plan: 2 weeks post op Contact information:  Eulah Pont MD, 13/6/22 PA-C  Dispo: PT recommending Skilled Nursing Facility/Rehab before being able to return to the memory care facility. Awaiting bed placement. Stable for d/c from ortho standpoint.      , PA-C Office [phone] 02/14/2021, 9:31 AM

## 2021-02-14 NOTE — Plan of Care (Signed)

## 2021-02-15 DIAGNOSIS — F03918 Unspecified dementia, unspecified severity, with other behavioral disturbance: Secondary | ICD-10-CM | POA: Diagnosis not present

## 2021-02-15 DIAGNOSIS — Z7189 Other specified counseling: Secondary | ICD-10-CM | POA: Diagnosis not present

## 2021-02-15 DIAGNOSIS — R531 Weakness: Secondary | ICD-10-CM | POA: Diagnosis not present

## 2021-02-15 DIAGNOSIS — S72001D Fracture of unspecified part of neck of right femur, subsequent encounter for closed fracture with routine healing: Secondary | ICD-10-CM | POA: Diagnosis not present

## 2021-02-15 LAB — CBC
HCT: 30.7 % — ABNORMAL LOW (ref 36.0–46.0)
Hemoglobin: 9.3 g/dL — ABNORMAL LOW (ref 12.0–15.0)
MCH: 30.5 pg (ref 26.0–34.0)
MCHC: 30.3 g/dL (ref 30.0–36.0)
MCV: 100.7 fL — ABNORMAL HIGH (ref 80.0–100.0)
Platelets: 123 10*3/uL — ABNORMAL LOW (ref 150–400)
RBC: 3.05 MIL/uL — ABNORMAL LOW (ref 3.87–5.11)
RDW: 13.7 % (ref 11.5–15.5)
WBC: 6 10*3/uL (ref 4.0–10.5)
nRBC: 0 % (ref 0.0–0.2)

## 2021-02-15 MED ORDER — RIVAROXABAN 10 MG PO TABS: 10.0000 mg | ORAL_TABLET | Freq: Every day | ORAL | 0 refills | Status: DC

## 2021-02-15 MED ORDER — NALOXONE HCL 0.4 MG/ML IJ SOLN
0.4000 mg | Freq: Once | INTRAMUSCULAR | Status: AC
  Administered 2021-02-15: 0.4 mg via INTRAVENOUS
  Filled 2021-02-15: qty 1

## 2021-02-15 MED ORDER — TRAMADOL HCL 50 MG PO TABS: 50.0000 mg | ORAL_TABLET | Freq: Three times a day (TID) | ORAL | 0 refills | Status: DC | PRN

## 2021-02-15 MED ORDER — ACETAMINOPHEN 500 MG PO TABS: 500.0000 mg | ORAL_TABLET | Freq: Four times a day (QID) | ORAL | 0 refills | Status: DC | PRN

## 2021-02-15 NOTE — NC FL2 (Signed)
[place] MEDICAID FL2 LEVEL OF CARE SCREENING TOOL     IDENTIFICATION  Patient Name: Rita Torres Birthdate: 1932-10-15 Sex: female Admission Date (Current Location): 02/11/2021  [facility] and [place] Number:  Producer, television/film/video and Address:  [facility],  [address] [place], [place] [address]      Provider Number: [id number]  Attending Physician Name and Address:  [place], Eric J, DO  Relative Name and Phone Number:  Joleene Burnham (daughter-in-law/POA) Ph: [phone]    Current Level of Care: Hospital Recommended Level of Care: Skilled Nursing Facility Prior Approval Number:    Date Approved/Denied:   PASRR Number: [id number]  Discharge Plan: SNF    Current Diagnoses: Patient Active Problem List   Diagnosis Date Noted   Fracture of femoral neck, right, closed (HCC) 02/12/2021   Dementia with behavioral disturbance 02/12/2021   Generalized weakness 02/12/2021   Goals of care, counseling/discussion 02/12/2021   Pressure injury of skin 02/12/2021   Mild cognitive impairment 09/14/2016   OAB (overactive bladder) 11/16/2013   Osteopenia     Orientation RESPIRATION BLADDER Height & Weight     Self  Normal Incontinent (Occasional incontinence) Weight: 121 lb 0.5 oz (54.9 kg) Height:  5\' 5"  (165.1 cm)  BEHAVIORAL SYMPTOMS/MOOD NEUROLOGICAL BOWEL NUTRITION STATUS  Physically abusive (Patient is oriented to person only and can become agitated with staff as a result. Patient occasionally hits staff.)  (N/A) Continent Diet (Regular diet)  AMBULATORY STATUS COMMUNICATION OF NEEDS Skin   Extensive Assist Verbally Surgical wounds, Other (Comment) (Eccyhmosis: hip, knee, leg, sacrum)                       Personal Care Assistance Level of Assistance  Bathing, Feeding, Dressing Bathing Assistance: Limited assistance Feeding assistance: Independent Dressing Assistance: Limited assistance     Functional Limitations Info  Sight, Hearing, Speech Sight  Info: Impaired Hearing Info: Impaired Speech Info: Adequate    SPECIAL CARE FACTORS FREQUENCY  PT (By licensed PT), OT (By licensed OT)     PT Frequency: 5x's/week OT Frequency: 5x's/week            Contractures Contractures Info: Not present    Additional Factors Info  Code Status, Allergies, Psychotropic Code Status Info: DNR Allergies Info: Penicillins Psychotropic Info: Celexa (citalopram), Remeron (mirtazapine), Depakote         Current Medications (02/15/2021):  This is the current hospital active medication list Current Facility-Administered Medications  Medication Dose Route Frequency Provider Last Rate Last Admin   acetaminophen (TYLENOL) tablet 325-650 mg  325-650 mg Oral Q6H PRN 13/12/2020, PA-C   650 mg at 02/13/21 2033   alum & mag hydroxide-simeth (MAALOX/MYLANTA) 200-200-20 MG/5ML suspension 30 mL  30 mL Oral Q4H PRN 10-16-2000 K, PA-C       bisacodyl (DULCOLAX) suppository 10 mg  10 mg Rectal Daily PRN Janine Ores, Blaine K, PA-C       Chlorhexidine Gluconate Cloth 2 % PADS 6 each  6 each Topical Daily 04-28-1974, MD       cholecalciferol (VITAMIN D3) tablet 1,000 Units  1,000 Units Oral Daily Kathlen Mody, MD   1,000 Units at 02/14/21 1605   citalopram (CELEXA) tablet 10 mg  10 mg Oral Daily 13/08/22 K, PA-C   10 mg at 02/14/21 1009   divalproex (DEPAKOTE ER) 24 hr tablet 250 mg  250 mg Oral Daily 13/08/22 K, PA-C   250 mg at 02/14/21 1010  docusate sodium (COLACE) capsule 100 mg  100 mg Oral BID Armida Sans, PA-C   100 mg at 02/14/21 2216   donepezil (ARICEPT) tablet 10 mg  10 mg Oral QHS Janine Ores K, PA-C   10 mg at 02/14/21 2216   feeding supplement (ENSURE SURGERY) liquid 237 mL  237 mL Oral BID BM Janine Ores K, PA-C   237 mL at 02/14/21 1606   memantine (NAMENDA) tablet 10 mg  10 mg Oral BID Janine Ores K, PA-C   10 mg at 02/14/21 2216   menthol-cetylpyridinium (CEPACOL) lozenge 3 mg  1 lozenge Oral PRN Janine Ores K, PA-C        Or   phenol (CHLORASEPTIC) mouth spray 1 spray  1 spray Mouth/Throat PRN Janine Ores K, PA-C       mirtazapine (REMERON) tablet 7.5 mg  7.5 mg Oral QHS Manson Passey, Blaine K, PA-C   7.5 mg at 02/14/21 2216   multivitamin with minerals tablet 1 tablet  1 tablet Oral Daily Janine Ores K, PA-C   1 tablet at 02/14/21 1009   ondansetron (ZOFRAN) tablet 4 mg  4 mg Oral Q6H PRN Janine Ores K, PA-C       Or   ondansetron [facility]) injection 4 mg  4 mg Intravenous Q6H PRN Janine Ores K, PA-C       polyethylene glycol (MIRALAX / GLYCOLAX) packet 17 g  17 g Oral Daily PRN Manson Passey, Blaine K, PA-C       rivaroxaban Carlena Hurl) tablet 10 mg  10 mg Oral Daily Janine Ores K, PA-C   10 mg at 02/14/21 1009   senna (SENOKOT) tablet 8.6 mg  1 tablet Oral BID Janine Ores K, PA-C   8.6 mg at 02/14/21 2215   sodium phosphate (FLEET) 7-19 GM/118ML enema 1 enema  1 enema Rectal Once PRN Armida Sans, PA-C         Discharge Medications: Please see discharge summary for a list of discharge medications.  Relevant Imaging Results:  Relevant Lab Results:   Additional Information SSN: [id number]  Ewing Schlein, LCSW

## 2021-02-15 NOTE — Care Management Important Message (Signed)
Important Message  Patient Details IM Letter placed in room for daughter inlaw [name] Name: Rita Torres MRN: [id number] Date of Birth: 09-May-1932   Medicare Important Message Given:  Yes     Caren Macadam 02/15/2021, 11:57 AM

## 2021-02-15 NOTE — Plan of Care (Signed)

## 2021-02-15 NOTE — TOC Progression Note (Signed)
Transition of Care [facility]) - Progression Note   Patient Details  Name: Rita Torres MRN: [id number] Date of Birth: [birth year]  Transition of Care [facility]) CM/SW Contact  Ewing Schlein, LCSW Phone Number: 02/15/2021, 3:24 PM  Clinical Narrative: New FL2 done after patient was seen by PT for second note. CSW resubmitted initial referral. Patient received bed offers. Family selected [facility]. CSW confirmed bed offer with Tresa Endo. Per Tresa Endo, the bed should be available by Friday/Saturday. Patient not medically stable for discharge at this time. TOC to follow.  Expected Discharge Plan: Skilled Nursing Facility Barriers to Discharge: Continued Medical Work up, SNF Pending bed offer  Expected Discharge Plan and Services Expected Discharge Plan: Skilled Nursing Facility In-house Referral: Clinical Social Work Post Acute Care Choice: Skilled Nursing Facility Living arrangements for the past 2 months: Assisted Living Facility Expected Discharge Date: 02/16/21               DME Arranged: N/A DME Agency: NA  Readmission Risk Interventions No flowsheet data found.

## 2021-02-15 NOTE — Progress Notes (Signed)
PROGRESS NOTE    Rita Torres  YYT:[id number] DOB: 1932-04-19 DOA: 02/11/2021 PCP: Daisy Floro, MD    Brief Narrative:  Rita Torres is an 87 year old female with past medical history significant for dementia with behavioral disturbance, vitamin D deficiency who presents to [facility] ED from [facility] memory care unit following fall.  Patient was found to have an impacted subcapital right femoral neck fracture.  Orthopedics was consulted and patient underwent cannulated hip pinning by Dr. Eulah Pont on 02/12/2021.  Awaiting SNF placement.   Assessment & Plan:   Principal Problem:   Fracture of femoral neck, right, closed (HCC) Active Problems:   Dementia with behavioral disturbance   Generalized weakness   Goals of care, counseling/discussion   Pressure injury of skin   Right femoral neck fracture Patient presenting to ED from [facility] memory care unit following fall at facility.  Imaging notable for impacted subcapital right femoral neck fracture.  Orthopedics was consulted and patient underwent cannulated hip pinning by Dr. Eulah Pont on 02/12/2021. --WBAT RLE --Tylenol PRN pain control (avoid narcotics) --Xarelto 10 mg p.o. daily x3 days for DVT prophylaxis per orthopedics --Outpatient follow-up 2 weeks for postoperative check --Continue PT/OT efforts while inpatient --Pending SNF placement  Somnolence secondary to narcotic effect Patient more somnolent, less responsive this AM.  Pupils noted to be pinpoint.  Received Narcan x2 this AM with some mild improvement.  Over the last 3 days has had several doses of fentanyl, morphine, Norco, tramadol. --Discontinue narcotics --Hold home Remeron --Continue monitor closely  Acute postoperative blood loss anemia Hemoglobin on admission, 10.0, dropped to 9.0 postoperatively; but now appears stable. --Hgb 10.0>9.4>9.0>9.7>9.3 -- Repeat CBC in the a.m., transfuse for hemoglobin less than 7.0  Dementia with behavioral  disturbance Patient currently resides at [facility] in memory care unit. --Namenda 10 mg p.o. twice daily --Aricept 10 mg p.o. nightly --Depakote 250 mg p.o. nightly --Hold home Remeron due to somnolence --Check Depakote level  Vitamin D deficiency: Cholecalciferol 1000 units p.o. daily  Depression/anxiety: Citalopram 10 mg p.o. daily  DVT prophylaxis: rivaroxaban (XARELTO) tablet 10 mg Start: 02/13/21 1000 SCDs Start: 02/12/21 1427 SCDs Start: 02/12/21 0117 rivaroxaban (XARELTO) tablet 10 mg    Code Status: DNR Family Communication: Updated patient's son and daughter-in-law present at bedside this morning  Disposition Plan:  Level of care: Med-Surg Status is: Inpatient  Remains inpatient appropriate because: Pending SNF placement    Consultants:  Orthopedics, Dr. Eulah Pont  Procedures:  Cannulated right hip pinning, 11/6 Dr. Eulah Pont  Antimicrobials:  Perioperative cefazolin    Subjective: Patient seen examined at bedside, resting comfortably.  Sleeping/somnolent.  Son and daughter-in-law present at bedside.  Family concerned about her sleeping and being less responsive.  Noted to have pinpoint pupils; review of EMR notes several doses of IV and oral narcotics past 3 days.  Will give IV Narcan this morning.  Unable to state any further ROS from patient this morning due to her somnolence, also underlying dementia.  Updated patient's son and daughter-in-law extensively at bedside this morning.  Awaiting SNF placement.  No other acute overnight per nursing staff.  Objective: Vitals:   02/15/21 0550 02/15/21 0948 02/15/21 1410 02/15/21 1505  BP: 110/86 138/86 134/70   Pulse: 82 83 87   Resp: 20 14 18    Temp: 99.3 F (37.4 C)   99.4 F (37.4 C)  TempSrc: Oral   Oral  SpO2: 96% 93% 95%   Weight:      Height:  Intake/Output Summary (Last 24 hours) at 02/15/2021 1537 Last data filed at 02/15/2021 1100 Gross per 24 hour  Intake 320 ml  Output 950 ml  Net  -630 ml   Filed Weights   02/11/21 2149 02/12/21 1006  Weight: 68 kg 54.9 kg    Examination:  General exam: Appears calm and comfortable, sleeping, elderly in appearance HEENT: pinpoint pupils 37mm bilaterally Respiratory system: Clear to auscultation. Respiratory effort normal. Cardiovascular system: S1 & S2 heard, RRR. No JVD, murmurs, rubs, gallops or clicks. No pedal edema. Gastrointestinal system: Abdomen is nondistended, soft and nontender. No organomegaly or masses felt. Normal bowel sounds heard. Central nervous system: Responds to pain, moves all extremities independently Extremities: Moves all extremities independently, right hip with dressing in place, clean/dry/intact Skin: No rashes, lesions or ulcers Psychiatry: Unable to fully assess due to mental status    Data Reviewed: I have personally reviewed following labs and imaging studies  CBC: Recent Labs  Lab 02/11/21 2250 02/12/21 0322 02/13/21 0323 02/14/21 0321 02/15/21 0333  WBC 7.8 5.2 6.4 5.4 6.0  NEUTROABS 6.2 3.3  --   --   --   HGB 10.0* 9.4* 9.0* 9.7* 9.3*  HCT 31.9* 30.3* 29.3* 31.2* 30.7*  MCV 99.7 98.7 99.0 100.3* 100.7*  PLT 134* 133* 121* 119* 123*   Basic Metabolic Panel: Recent Labs  Lab 02/11/21 2250 02/12/21 0322 02/13/21 0323 02/14/21 0321  NA 142 140 139 140  K 4.2 3.9 4.1 4.8  CL 106 108 105 106  CO2 30 28 28 27   GLUCOSE 118* 107* 122* 100*  BUN 30* 26* 21 21  CREATININE 0.68 0.56 0.70 0.71  CALCIUM 8.7* 8.6* 8.3* 8.6*   GFR: Estimated Creatinine Clearance: 43.7 mL/min (by C-G formula based on SCr of 0.71 mg/dL). Liver Function Tests: Recent Labs  Lab 02/12/21 0322  AST 19  ALT 14  ALKPHOS 147*  BILITOT 0.6  PROT 5.6*  ALBUMIN 2.9*   No results for input(s): LIPASE, AMYLASE in the last 168 hours. Recent Labs  Lab 02/14/21 1723  AMMONIA 28   Coagulation Profile: Recent Labs  Lab 02/11/21 2250 02/12/21 0322  INR 1.0 1.0   Cardiac Enzymes: No results for  input(s): CKTOTAL, CKMB, CKMBINDEX, TROPONINI in the last 168 hours. BNP (last 3 results) No results for input(s): PROBNP in the last 8760 hours. HbA1C: No results for input(s): HGBA1C in the last 72 hours. CBG: No results for input(s): GLUCAP in the last 168 hours. Lipid Profile: No results for input(s): CHOL, HDL, LDLCALC, TRIG, CHOLHDL, LDLDIRECT in the last 72 hours. Thyroid Function Tests: Recent Labs    02/14/21 1723  TSH 4.702*   Anemia Panel: Recent Labs    02/14/21 1723  VITAMINB12 247   Sepsis Labs: No results for input(s): PROCALCITON, LATICACIDVEN in the last 168 hours.  Recent Results (from the past 240 hour(s))  Resp Panel by RT-PCR (Flu A&B, Covid) Nasopharyngeal Swab     Status: None   Collection Time: 02/12/21 12:05 AM   Specimen: Nasopharyngeal Swab; Nasopharyngeal(NP) swabs in vial transport medium  Result Value Ref Range Status   SARS Coronavirus 2 by RT PCR NEGATIVE NEGATIVE Final    Comment: (NOTE) SARS-CoV-2 target nucleic acids are NOT DETECTED.  The SARS-CoV-2 RNA is generally detectable in upper respiratory specimens during the acute phase of infection. The lowest concentration of SARS-CoV-2 viral copies this assay can detect is 138 copies/mL. A negative result does not preclude SARS-Cov-2 infection and should not be used as  the sole basis for treatment or other patient management decisions. A negative result may occur with  improper specimen collection/handling, submission of specimen other than nasopharyngeal swab, presence of viral mutation(s) within the areas targeted by this assay, and inadequate number of viral copies(<138 copies/mL). A negative result must be combined with clinical observations, patient history, and epidemiological information. The expected result is Negative.  Fact Sheet for Patients:  BloggerCourse.com  Fact Sheet for Healthcare Providers:  SeriousBroker.it  This  test is no t yet approved or cleared by the [place] FDA and  has been authorized for detection and/or diagnosis of SARS-CoV-2 by FDA under an Emergency Use Authorization (EUA). This EUA will remain  in effect (meaning this test can be used) for the duration of the COVID-19 declaration under Section 564(b)(1) of the Act, 21 U.S.C.section 360bbb-3(b)(1), unless the authorization is terminated  or revoked sooner.       Influenza A by PCR NEGATIVE NEGATIVE Final   Influenza B by PCR NEGATIVE NEGATIVE Final    Comment: (NOTE) The Xpert Xpress SARS-CoV-2/FLU/RSV plus assay is intended as an aid in the diagnosis of influenza from Nasopharyngeal swab specimens and should not be used as a sole basis for treatment. Nasal washings and aspirates are unacceptable for Xpert Xpress SARS-CoV-2/FLU/RSV testing.  Fact Sheet for Patients: BloggerCourse.com  Fact Sheet for Healthcare Providers: SeriousBroker.it  This test is not yet approved or cleared by the [place] FDA and has been authorized for detection and/or diagnosis of SARS-CoV-2 by FDA under an Emergency Use Authorization (EUA). This EUA will remain in effect (meaning this test can be used) for the duration of the COVID-19 declaration under Section 564(b)(1) of the Act, 21 U.S.C. section 360bbb-3(b)(1), unless the authorization is terminated or revoked.  Performed at [facility], [address] [place], [place] [address]   Urine Culture     Status: Abnormal   Collection Time: 02/12/21  9:08 AM   Specimen: Urine, Clean Catch  Result Value Ref Range Status   Specimen Description   Final    URINE, CLEAN CATCH Performed at [facility], [address] [place], [place] [address]    Special Requests   Final    NONE Performed at [facility], [address] [place], [place] [address]    Culture MULTIPLE SPECIES  PRESENT, SUGGEST RECOLLECTION (A)  Final   Report Status 02/13/2021 FINAL  Final  Surgical pcr screen     Status: None   Collection Time: 02/12/21 10:10 AM   Specimen: Nasal Mucosa; Nasal Swab  Result Value Ref Range Status   MRSA, PCR NEGATIVE NEGATIVE Final   Staphylococcus aureus NEGATIVE NEGATIVE Final    Comment: (NOTE) The Xpert SA Assay (FDA approved for NASAL specimens in patients 52 years of age and older), is one component of a comprehensive surveillance program. It is not intended to diagnose infection nor to guide or monitor treatment. Performed at [facility], [address], [place], [place] [address]          Radiology Studies: No results found.      Scheduled Meds:  Chlorhexidine Gluconate Cloth  6 each Topical Daily   cholecalciferol  1,000 Units Oral Daily   citalopram  10 mg Oral Daily   divalproex  250 mg Oral Daily   docusate sodium  100 mg Oral BID   donepezil  10 mg Oral QHS   feeding supplement  237 mL Oral BID BM  memantine  10 mg Oral BID   mirtazapine  7.5 mg Oral QHS   multivitamin with minerals  1 tablet Oral Daily   rivaroxaban  10 mg Oral Daily   senna  1 tablet Oral BID   Continuous Infusions:   LOS: 3 days    Time spent: 39 minutes spent on chart review, discussion with nursing staff, consultants, updating family and interview/physical exam; more than 50% of that time was spent in counseling and/or coordination of care.    Alvira Philips [place], DO [facility] Available via Epic secure chat 7am-7pm After these hours, please refer to coverage provider listed on [email or web address] 02/15/2021, 3:37 PM

## 2021-02-15 NOTE — Progress Notes (Signed)
Patient somnolent, less responsive, Narcan given 2x. Pt responds to voice but would go back to sleep. VS taken and recorded, family at bedside updated. Family at bedside stated that they are concerned and worried that Pt is somnolent and that this is not her baseline, MD updated, no new orders, Pt monitored.  Pt alert, irritable and agitated during bed bath. Pt repositioned and monitored. Misty Stanley updated over the phone.  Endorsed accordingly to [employer], RN.

## 2021-02-15 NOTE — Progress Notes (Signed)
    Subjective: Patient has dementia and is thus a poor historian. Asleep. Hard to arouse. Often irritated if woken. Still not eating much. Food trays go untouched. Urinating. No CP, SOB. PT report looks like she did mobilize OOB well with them yesterday.   Objective:   VITALS:   Vitals:   02/14/21 1346 02/14/21 2348 02/15/21 0550 02/15/21 0948  BP: 103/74 (!) 167/78 110/86 138/86  Pulse: 80 89 82 83  Resp: 16 20 20 14   Temp: 97.7 F (36.5 C) 98.2 F (36.8 C) 99.3 F (37.4 C)   TempSrc:  Oral Oral   SpO2: 96% 96% 96% 93%  Weight:      Height:       CBC Latest Ref Rng & Units 02/15/2021 02/14/2021 02/13/2021  WBC 4.0 - 10.5 K/uL 6.0 5.4 6.4  Hemoglobin 12.0 - 15.0 g/dL 13/10/2020) 2.7(N) 1.7(G)  Hematocrit 36.0 - 46.0 % 30.7(L) 31.2(L) 29.3(L)  Platelets 150 - 400 K/uL 123(L) 119(L) 121(L)   BMP Latest Ref Rng & Units 02/14/2021 02/13/2021 02/12/2021  Glucose 70 - 99 mg/dL 13/09/2020) 494(W) 967(R)  BUN 8 - 23 mg/dL 21 21 916(B)  Creatinine 0.44 - 1.00 mg/dL 84(Y 6.59 9.35  BUN/Creat Ratio 6 - 22 (calc) - - -  Sodium 135 - 145 mmol/L 140 139 140  Potassium 3.5 - 5.1 mmol/L 4.8 4.1 3.9  Chloride 98 - 111 mmol/L 106 105 108  CO2 22 - 32 mmol/L 27 28 28   Calcium 8.9 - 10.3 mg/dL 7.01) 8.3(L) 8.6(L)   Intake/Output      11/08 0701 11/09 0700 11/09 0701 11/10 0700   P.O. 490 0   Total Intake(mL/kg) 490 (8.9) 0 (0)   Urine (mL/kg/hr) 450 (0.3) 500 (1.4)   Stool  0   Total Output 450 500   Net +40 -500        Urine Occurrence 3 x 1 x   Stool Occurrence  0 x      Physical Exam: General: NAD. Asleep in bed. Mouth open.  Resp: No increased wob Cardio: regular rate and rhythm ABD soft Neurologically intact MSK Neurovascularly intact Sensation intact distally Intact pulses distally Dorsiflexion/Plantar flexion intact Incision: dressing C/D/I Ecchymosis around surgical site   Assessment: 3 Days Post-Op  S/P Procedure(s) (LRB): CANNULATED HIP PINNING (Right) by Dr. 13/09. 13/09 on 02/12/21  Principal Problem:   Fracture of femoral neck, right, closed (HCC) Active Problems:   Dementia with behavioral disturbance   Generalized weakness   Goals of care, counseling/discussion   Pressure injury of skin   Plan: Monitor to ensure patient doesn't leave bed alone to minimize fall risk  Minimize agitation Encourage better oral intake of food and fluids Advance diet Up with therapy Incentive Spirometry Elevate and Apply ice  Weightbearing: WBAT RLE Insicional and dressing care: Dressings left intact until follow-up and Reinforce dressings as needed Orthopedic device(s): None Showering: Keep dressing dry VTE prophylaxis:  Xarelto 10mg  daily x 30 days post-op , SCDs, ambulation Pain control: Tylenol and Tramadol PRN. Minimize narcotics due to dementia Follow - up plan: 2 weeks post op Contact information:  Eulah Pont MD, 13/6/22 PA-C  Dispo: PT recommending Skilled Nursing Facility/Rehab before being able to return to the memory care facility. Awaiting bed placement. Stable for d/c from ortho standpoint. Will print and sign DVT and pain medicine scripts and place in chart.     , PA-C Office [phone] 02/15/2021, 1:21 PM

## 2021-02-16 DIAGNOSIS — Z7189 Other specified counseling: Secondary | ICD-10-CM | POA: Diagnosis not present

## 2021-02-16 DIAGNOSIS — S72001D Fracture of unspecified part of neck of right femur, subsequent encounter for closed fracture with routine healing: Secondary | ICD-10-CM | POA: Diagnosis not present

## 2021-02-16 DIAGNOSIS — R531 Weakness: Secondary | ICD-10-CM | POA: Diagnosis not present

## 2021-02-16 DIAGNOSIS — F03918 Unspecified dementia, unspecified severity, with other behavioral disturbance: Secondary | ICD-10-CM | POA: Diagnosis not present

## 2021-02-16 LAB — COMPREHENSIVE METABOLIC PANEL
ALT: 14 U/L (ref 0–44)
AST: 25 U/L (ref 15–41)
Albumin: 3.1 g/dL — ABNORMAL LOW (ref 3.5–5.0)
Alkaline Phosphatase: 156 U/L — ABNORMAL HIGH (ref 38–126)
Anion gap: 9 (ref 5–15)
BUN: 24 mg/dL — ABNORMAL HIGH (ref 8–23)
CO2: 26 mmol/L (ref 22–32)
Calcium: 8.8 mg/dL — ABNORMAL LOW (ref 8.9–10.3)
Chloride: 105 mmol/L (ref 98–111)
Creatinine, Ser: 0.63 mg/dL (ref 0.44–1.00)
GFR, Estimated: >60 mL/min (ref >60)
Glucose, Bld: 89 mg/dL (ref 70–99)
Potassium: 4.5 mmol/L (ref 3.5–5.1)
Sodium: 140 mmol/L (ref 135–145)
Total Bilirubin: 0.9 mg/dL (ref 0.3–1.2)
Total Protein: 6.1 g/dL — ABNORMAL LOW (ref 6.5–8.1)

## 2021-02-16 LAB — RESP PANEL BY RT-PCR (FLU A&B, COVID) ARPGX2
Influenza A by PCR: NEGATIVE
Influenza B by PCR: NEGATIVE
SARS Coronavirus 2 by RT PCR: NEGATIVE

## 2021-02-16 LAB — CBC
HCT: 34.8 % — ABNORMAL LOW (ref 36.0–46.0)
Hemoglobin: 10.5 g/dL — ABNORMAL LOW (ref 12.0–15.0)
MCH: 30.3 pg (ref 26.0–34.0)
MCHC: 30.2 g/dL (ref 30.0–36.0)
MCV: 100.6 fL — ABNORMAL HIGH (ref 80.0–100.0)
Platelets: 141 10*3/uL — ABNORMAL LOW (ref 150–400)
RBC: 3.46 MIL/uL — ABNORMAL LOW (ref 3.87–5.11)
RDW: 13.6 % (ref 11.5–15.5)
WBC: 6.5 10*3/uL (ref 4.0–10.5)
nRBC: 0 % (ref 0.0–0.2)

## 2021-02-16 LAB — VALPROIC ACID LEVEL: Valproic Acid Lvl: <10 ug/mL — ABNORMAL LOW (ref 50.0–100.0)

## 2021-02-16 LAB — MAGNESIUM: Magnesium: 2.5 mg/dL — ABNORMAL HIGH (ref 1.7–2.4)

## 2021-02-16 MED ORDER — LACTATED RINGERS IV SOLN: INTRAVENOUS | Status: DC

## 2021-02-16 NOTE — TOC Progression Note (Addendum)
Transition of Care [facility]) - Progression Note   Patient Details  Name: Rita Torres MRN: [id number] Date of Birth: 27-Jan-1933  Transition of Care [facility]) CM/SW Contact  Ewing Schlein, LCSW Phone Number: 02/16/2021, 1:23 PM  Clinical Narrative: Patient is expected to discharge to SNF tomorrow. CSW completed insurance authorization through the [facility] portal. Reference ID # is: [id number]. Patient has been approved for 5 days starting 02/17/2021 with the next review date of 02/21/2021. Daughter-in-law is aware insurance has been approved. TOC to follow.  Expected Discharge Plan: Skilled Nursing Facility Barriers to Discharge: Continued Medical Work up  Expected Discharge Plan and Services Expected Discharge Plan: Skilled Nursing Facility In-house Referral: Clinical Social Work Post Acute Care Choice: Skilled Nursing Facility Living arrangements for the past 2 months: Assisted Living Facility Expected Discharge Date: 02/16/21               DME Arranged: N/A DME Agency: NA  Readmission Risk Interventions No flowsheet data found.

## 2021-02-16 NOTE — Progress Notes (Signed)
Physical Therapy Treatment Patient Details Name: Rita Torres MRN: [id number] DOB: 1932-09-29 Today's Date: 02/16/2021   History of Present Illness 87 yo female admitted after sustaining fx after falling at ALF/memory care facility. S/P cannulated R hip pinning 11/6. Hx of dementia.    PT Comments    Patient making steady progress with therapy. Pt initially agitated but improved with mobility. Pt c/o need for BM during session and much of session occupied by multiple sit<>stands and attempts to void. RN alerted of pt difficulty and assisted pt with BM.  End of session pt ambulated ~8' with min assist +2 for safety and sat in recliner with pillows to position posture as pt tends to lean Rt. Pt more upright and with improved Lt LE advancement during gait this session. Continue to recommend SNF follow up for therapy to improve mobility and independence. Acute PT will progress pt as able.     Recommendations for follow up therapy are one component of a multi-disciplinary discharge planning process, led by the attending physician.  Recommendations may be updated based on patient status, additional functional criteria and insurance authorization.  Follow Up Recommendations  Skilled nursing-short term rehab (<3 hours/day)     Assistance Recommended at Discharge Frequent or constant Supervision/Assistance  Equipment Recommendations  Rolling walker (2 wheels)    Recommendations for Other Services       Precautions / Restrictions Precautions Precautions: Fall Restrictions Weight Bearing Restrictions: No RLE Weight Bearing: Weight bearing as tolerated     Mobility  Bed Mobility Overal bed mobility: Needs Assistance Bed Mobility: Supine to Sit     Supine to sit: HOB elevated;+2 for safety/equipment;Mod assist     General bed mobility comments: Mod+2 assist for supine to sit EOB, pt's LE's part way off EOB at start and cues to reposition for safety needed. Mod assist to raise trunk  fully and stabilze at EOB.    Transfers Overall transfer level: Needs assistance Equipment used: Rolling walker (2 wheels) Transfers: Sit to/from Stand Sit to Stand: Min assist;+2 safety/equipment           General transfer comment: Pt performed multiple stands from EOB and Min +2 assist to stabilize for safety. Assist to manage/direct walker to move to [facility]. Patient completed ~5 sit<>stands from [facility] while attempting to empty bowels.    Ambulation/Gait Ambulation/Gait assistance: +2 safety/equipment;Mod assist Gait Distance (Feet): 8 Feet Assistive device: Rolling walker (2 wheels) Gait Pattern/deviations: Step-to pattern;Decreased step length - right;Decreased step length - left;Decreased stride length;Shuffle Gait velocity: decr     General Gait Details: pt ambulated short distance from [facility] to reclienr and assist provied to steady andn turn to sit in chair. Fewer cues needed to advance Lt LE this date and pt maintained more upright posture despite hip pain.   Stairs             Wheelchair Mobility    Modified Rankin (Stroke Patients Only)       Balance Overall balance assessment: Needs assistance;History of Falls Sitting-balance support: No upper extremity supported;Feet supported Sitting balance-Leahy Scale: Fair     Standing balance support: Bilateral upper extremity supported;During functional activity;Reliant on assistive device for balance Standing balance-Leahy Scale: Poor Standing balance comment: heavy reliance on RW and external support of therapist                            Cognition Arousal/Alertness: Awake/alert Behavior During Therapy: WFL for tasks assessed/performed Overall  Cognitive Status: History of cognitive impairments - at baseline                                 General Comments: pt tends to keep eyes closed        Exercises      General Comments        Pertinent Vitals/Pain Breathing:  normal Negative Vocalization: none Facial Expression: sad, frightened, frown Body Language: tense, distressed pacing, fidgeting Consolability: distracted or reassured by voice/touch PAINAD Score: 3 Pain Location: R hip Pain Intervention(s): Limited activity within patient's tolerance;Monitored during session;Repositioned    Home Living                          Prior Function            PT Goals (current goals can now be found in the care plan section) Acute Rehab PT Goals Patient Stated Goal: pt unable to state PT Goal Formulation: Patient unable to participate in goal setting Time For Goal Achievement: 02/27/21 Potential to Achieve Goals: Good Progress towards PT goals: Progressing toward goals    Frequency    Min 2X/week      PT Plan Current plan remains appropriate    Co-evaluation              AM-PAC PT "6 Clicks" Mobility   Outcome Measure  Help needed turning from your back to your side while in a flat bed without using bedrails?: Total Help needed moving from lying on your back to sitting on the side of a flat bed without using bedrails?: Total Help needed moving to and from a bed to a chair (including a wheelchair)?: Total Help needed standing up from a chair using your arms (e.g., wheelchair or bedside chair)?: Total Help needed to walk in hospital room?: Total Help needed climbing 3-5 steps with a railing? : Total 6 Click Score: 6    End of Session Equipment Utilized During Treatment: Gait belt Activity Tolerance: Patient tolerated treatment well Patient left: in bed;with call bell/phone within reach;with chair alarm set Nurse Communication: Mobility status PT Visit Diagnosis: History of falling (Z91.81);Muscle weakness (generalized) (M62.81);Other abnormalities of gait and mobility (R26.89)     Time: 4239-5320 PT Time Calculation (min) (ACUTE ONLY): 33 min  Charges:  $Gait Training: 8-22 mins $Therapeutic Exercise: 8-22 mins                      Wynn Maudlin, DPT Acute Rehabilitation Services Office [phone] Pager [phone]    Anitra Lauth 02/16/2021, 12:02 PM

## 2021-02-16 NOTE — Progress Notes (Addendum)
PROGRESS NOTE    Rita Torres  BFX:[id number] DOB: 12-05-1932 DOA: 02/11/2021 PCP: Daisy Floro, MD    Brief Narrative:  Rita Torres is an 87 year old female with past medical history significant for dementia with behavioral disturbance, vitamin D deficiency who presents to [facility] ED from [facility] memory care unit following fall.  Patient was found to have an impacted subcapital right femoral neck fracture.  Orthopedics was consulted and patient underwent cannulated hip pinning by Dr. Eulah Pont on 02/12/2021.  Awaiting SNF placement.   Assessment & Plan:   Principal Problem:   Fracture of femoral neck, right, closed (HCC) Active Problems:   Dementia with behavioral disturbance   Generalized weakness   Goals of care, counseling/discussion   Pressure injury of skin   Right femoral neck fracture Patient presenting to ED from [facility] memory care unit following fall at facility.  Imaging notable for impacted subcapital right femoral neck fracture.  Orthopedics was consulted and patient underwent cannulated hip pinning by Dr. Eulah Pont on 02/12/2021. --WBAT RLE --Tylenol PRN pain control (avoid narcotics) --Xarelto 10 mg p.o. daily x3 days for DVT prophylaxis per orthopedics --Outpatient follow-up 2 weeks for postoperative check --Continue PT/OT efforts while inpatient --Anticipate discharge to [facility] SNF tomorrow  Somnolence secondary to narcotic effect: Improved Patient more somnolent, less responsive on 11/9, with pupils noted to be pinpoint.  Earlier in the hospitalization, patient received several doses of fentanyl, morphine, Norco, tramadol which is likely contributing factor. Received Narcan x2 with improvement. --Discontinue narcotics -- Discontinued Remeron --Continue monitor closely  Acute postoperative blood loss anemia Hemoglobin on admission, 10.0, dropped to 9.0 postoperatively; but now appears stable. --Hgb 10.0>9.4>9.0>9.7>9.3>10.5 --Repeat  CBC in the a.m., transfuse for hemoglobin less than 7.0  Dementia with behavioral disturbance Patient currently resides at [facility] in memory care unit. --Namenda 10 mg p.o. twice daily --Aricept 10 mg p.o. nightly --Depakote 250 mg p.o. nightly (Depakote level < 10 on 11/10) --Hold home Remeron due to somnolence  Vitamin D deficiency: Cholecalciferol 1000 units p.o. daily  Depression/anxiety: Citalopram 10 mg p.o. daily  DVT prophylaxis: rivaroxaban (XARELTO) tablet 10 mg Start: 02/13/21 1000 SCDs Start: 02/12/21 1427 SCDs Start: 02/12/21 0117 rivaroxaban (XARELTO) tablet 10 mg    Code Status: DNR Family Communication: No family present at bedside this morning.  Updated patient's daughter-in-law Rita Torres via telephone this afternoon Disposition Plan:  Level of care: Med-Surg Status is: Inpatient  Remains inpatient appropriate because: Pending SNF placement; dissipate discharge to [facility] healthcare SNF tomorrow    Consultants:  Orthopedics, Dr. Eulah Pont  Procedures:  Cannulated right hip pinning, 11/6 Dr. Eulah Pont  Antimicrobials:  Perioperative cefazolin    Subjective: Patient seen examined at bedside, resting comfortably.  Sleeping but arousable.  Pleasantly confused.  States "leave me alone" and "I do not want open my eyes".  Nurse tech present assisting with breakfast this morning.  Alertness much improved since yesterday.  Worked with physical therapy today.  Anticipate discharge to SNF tomorrow.  No family present at bedside this morning.  No acute events overnight per nursing staff.   Objective: Vitals:   02/15/21 1620 02/15/21 1756 02/15/21 1942 02/16/21 0656  BP:  115/67 111/80 (!) 169/86  Pulse:  78 72 76  Resp:  14 18 18   Temp: 98.4 F (36.9 C) 97.9 F (36.6 C) 98.7 F (37.1 C)   TempSrc: Axillary Axillary Oral   SpO2:  95% 95% 95%  Weight:      Height:  Intake/Output Summary (Last 24 hours) at 02/16/2021 1242 Last data filed at  02/16/2021 1232 Gross per 24 hour  Intake 490 ml  Output 550 ml  Net -60 ml   Filed Weights   02/11/21 2149 02/12/21 1006  Weight: 68 kg 54.9 kg    Examination:  General exam: Appears calm and comfortable, sleeping but arousable, elderly in appearance Respiratory system: Clear to auscultation. Respiratory effort normal. Cardiovascular system: S1 & S2 heard, RRR. No JVD, murmurs, rubs, gallops or clicks. No pedal edema. Gastrointestinal system: Abdomen is nondistended, soft and nontender. No organomegaly or masses felt. Normal bowel sounds heard. Central nervous system: Alert, not oriented to person/place/time or situation, neurovascular intact Extremities: Moves all extremities independently Skin: No rashes, lesions or ulcers Psychiatry: Judgment and insight appear poor, normal mood/behavior    Data Reviewed: I have personally reviewed following labs and imaging studies  CBC: Recent Labs  Lab 02/11/21 2250 02/12/21 0322 02/13/21 0323 02/14/21 0321 02/15/21 0333 02/16/21 0320  WBC 7.8 5.2 6.4 5.4 6.0 6.5  NEUTROABS 6.2 3.3  --   --   --   --   HGB 10.0* 9.4* 9.0* 9.7* 9.3* 10.5*  HCT 31.9* 30.3* 29.3* 31.2* 30.7* 34.8*  MCV 99.7 98.7 99.0 100.3* 100.7* 100.6*  PLT 134* 133* 121* 119* 123* 141*   Basic Metabolic Panel: Recent Labs  Lab 02/11/21 2250 02/12/21 0322 02/13/21 0323 02/14/21 0321 02/16/21 0320  NA 142 140 139 140 140  K 4.2 3.9 4.1 4.8 4.5  CL 106 108 105 106 105  CO2 30 28 28 27 26   GLUCOSE 118* 107* 122* 100* 89  BUN 30* 26* 21 21 24*  CREATININE 0.68 0.56 0.70 0.71 0.63  CALCIUM 8.7* 8.6* 8.3* 8.6* 8.8*  MG  --   --   --   --  2.5*   GFR: Estimated Creatinine Clearance: 43.7 mL/min (by C-G formula based on SCr of 0.63 mg/dL). Liver Function Tests: Recent Labs  Lab 02/12/21 0322 02/16/21 0320  AST 19 25  ALT 14 14  ALKPHOS 147* 156*  BILITOT 0.6 0.9  PROT 5.6* 6.1*  ALBUMIN 2.9* 3.1*   No results for input(s): LIPASE, AMYLASE in the  last 168 hours. Recent Labs  Lab 02/14/21 1723  AMMONIA 28   Coagulation Profile: Recent Labs  Lab 02/11/21 2250 02/12/21 0322  INR 1.0 1.0   Cardiac Enzymes: No results for input(s): CKTOTAL, CKMB, CKMBINDEX, TROPONINI in the last 168 hours. BNP (last 3 results) No results for input(s): PROBNP in the last 8760 hours. HbA1C: No results for input(s): HGBA1C in the last 72 hours. CBG: No results for input(s): GLUCAP in the last 168 hours. Lipid Profile: No results for input(s): CHOL, HDL, LDLCALC, TRIG, CHOLHDL, LDLDIRECT in the last 72 hours. Thyroid Function Tests: Recent Labs    02/14/21 1723  TSH 4.702*   Anemia Panel: Recent Labs    02/14/21 1723  VITAMINB12 247   Sepsis Labs: No results for input(s): PROCALCITON, LATICACIDVEN in the last 168 hours.  Recent Results (from the past 240 hour(s))  Resp Panel by RT-PCR (Flu A&B, Covid) Nasopharyngeal Swab     Status: None   Collection Time: 02/12/21 12:05 AM   Specimen: Nasopharyngeal Swab; Nasopharyngeal(NP) swabs in vial transport medium  Result Value Ref Range Status   SARS Coronavirus 2 by RT PCR NEGATIVE NEGATIVE Final    Comment: (NOTE) SARS-CoV-2 target nucleic acids are NOT DETECTED.  The SARS-CoV-2 RNA is generally detectable in upper respiratory specimens  during the acute phase of infection. The lowest concentration of SARS-CoV-2 viral copies this assay can detect is 138 copies/mL. A negative result does not preclude SARS-Cov-2 infection and should not be used as the sole basis for treatment or other patient management decisions. A negative result may occur with  improper specimen collection/handling, submission of specimen other than nasopharyngeal swab, presence of viral mutation(s) within the areas targeted by this assay, and inadequate number of viral copies(<138 copies/mL). A negative result must be combined with clinical observations, patient history, and epidemiological information. The  expected result is Negative.  Fact Sheet for Patients:  BloggerCourse.com  Fact Sheet for Healthcare Providers:  SeriousBroker.it  This test is no t yet approved or cleared by the [place] FDA and  has been authorized for detection and/or diagnosis of SARS-CoV-2 by FDA under an Emergency Use Authorization (EUA). This EUA will remain  in effect (meaning this test can be used) for the duration of the COVID-19 declaration under Section 564(b)(1) of the Act, 21 U.S.C.section 360bbb-3(b)(1), unless the authorization is terminated  or revoked sooner.       Influenza A by PCR NEGATIVE NEGATIVE Final   Influenza B by PCR NEGATIVE NEGATIVE Final    Comment: (NOTE) The Xpert Xpress SARS-CoV-2/FLU/RSV plus assay is intended as an aid in the diagnosis of influenza from Nasopharyngeal swab specimens and should not be used as a sole basis for treatment. Nasal washings and aspirates are unacceptable for Xpert Xpress SARS-CoV-2/FLU/RSV testing.  Fact Sheet for Patients: BloggerCourse.com  Fact Sheet for Healthcare Providers: SeriousBroker.it  This test is not yet approved or cleared by the [place] FDA and has been authorized for detection and/or diagnosis of SARS-CoV-2 by FDA under an Emergency Use Authorization (EUA). This EUA will remain in effect (meaning this test can be used) for the duration of the COVID-19 declaration under Section 564(b)(1) of the Act, 21 U.S.C. section 360bbb-3(b)(1), unless the authorization is terminated or revoked.  Performed at [facility], [address], [place], [place] [address]   Urine Culture     Status: Abnormal   Collection Time: 02/12/21  9:08 AM   Specimen: Urine, Clean Catch  Result Value Ref Range Status   Specimen Description   Final    URINE, CLEAN CATCH Performed at [facility], [address], [place], [place] [address]    Special Requests   Final    NONE Performed at [facility], [address] [place], [place] [address]    Culture MULTIPLE SPECIES PRESENT, SUGGEST RECOLLECTION (A)  Final   Report Status 02/13/2021 FINAL  Final  Surgical pcr screen     Status: None   Collection Time: 02/12/21 10:10 AM   Specimen: Nasal Mucosa; Nasal Swab  Result Value Ref Range Status   MRSA, PCR NEGATIVE NEGATIVE Final   Staphylococcus aureus NEGATIVE NEGATIVE Final    Comment: (NOTE) The Xpert SA Assay (FDA approved for NASAL specimens in patients 25 years of age and older), is one component of a comprehensive surveillance program. It is not intended to diagnose infection nor to guide or monitor treatment. Performed at [facility], [address] [place], [place] [address]          Radiology Studies: No results found.      Scheduled Meds:  Chlorhexidine Gluconate Cloth  6 each Topical Daily   cholecalciferol  1,000 Units Oral Daily   citalopram  10 mg Oral Daily  divalproex  250 mg Oral Daily   docusate sodium  100 mg Oral BID   donepezil  10 mg Oral QHS   feeding supplement  237 mL Oral BID BM   memantine  10 mg Oral BID   multivitamin with minerals  1 tablet Oral Daily   rivaroxaban  10 mg Oral Daily   senna  1 tablet Oral BID   Continuous Infusions:  lactated ringers       LOS: 4 days    Time spent: 39 minutes spent on chart review, discussion with nursing staff, consultants, updating family and interview/physical exam; more than 50% of that time was spent in counseling and/or coordination of care.    Alvira Philips [place], DO [facility] Available via Epic secure chat 7am-7pm After these hours, please refer to coverage provider listed on [email or web address] 02/16/2021, 12:42 PM

## 2021-02-17 DIAGNOSIS — F03918 Unspecified dementia, unspecified severity, with other behavioral disturbance: Secondary | ICD-10-CM | POA: Diagnosis not present

## 2021-02-17 DIAGNOSIS — Z7189 Other specified counseling: Secondary | ICD-10-CM | POA: Diagnosis not present

## 2021-02-17 DIAGNOSIS — R531 Weakness: Secondary | ICD-10-CM | POA: Diagnosis not present

## 2021-02-17 DIAGNOSIS — S72001D Fracture of unspecified part of neck of right femur, subsequent encounter for closed fracture with routine healing: Secondary | ICD-10-CM | POA: Diagnosis not present

## 2021-02-17 MED ORDER — VITAMIN D3 25 MCG PO TABS: 1000.0000 [IU] | ORAL_TABLET | Freq: Every day | ORAL | Status: AC

## 2021-02-17 NOTE — TOC Transition Note (Signed)
Transition of Care [facility]) - CM/SW Discharge Note   Patient Details  Name: Rita Torres MRN: [id number] Date of Birth: 01-17-1933  Transition [facility]) CM/SW Contact:  Amada Jupiter, LCSW Phone Number: 02/17/2021, 1:50 PM   Clinical Narrative:    Pt medically cleared for SNF dc today.  Bed accepted at [facility] and [facility] called at 1:50pm for pick up.  RN to call report to [phone].  No further TOC needs.   Final next level of care: Skilled Nursing Facility Barriers to Discharge: Barriers Resolved   Patient Goals and CMS Choice Patient states their goals for this hospitalization and ongoing recovery are:: Go to rehab and then return to memory care ALF CMS Medicare.gov Compare Post Acute Care list provided to:: Patient Represenative (must comment) Choice offered to / list presented to : [facility] POA / Guardian, Adult Children  Discharge Placement              Patient chooses bed at: [facility] Patient to be transferred to facility by: [facility] Name of family member notified: son and dtr-in-law Patient and family notified of of transfer: 02/17/21  Discharge Plan and Services In-house Referral: Clinical Social Work   Post Acute Care Choice: Skilled Nursing Facility          DME Arranged: N/A DME Agency: NA                  Social Determinants of Health (SDOH) Interventions     Readmission Risk Interventions No flowsheet data found.

## 2021-02-17 NOTE — Discharge Summary (Signed)
Physician Discharge Summary  Rita Torres OJJ:[id number] DOB: [birth year] DOA: 02/11/2021  PCP: Daisy Floro, MD  Admit date: 02/11/2021 Discharge date: 02/17/2021  Admitted From: [facility] assisted living facility/memory care unit Disposition: [facility]  Recommendations for Outpatient Follow-up:  Follow up with PCP in 1-2 weeks Follow-up with orthopedics, Dr. Eulah Pont as scheduled Recommends avoidance of narcotics, sedating medications in this patient  Discharge Condition: Stable CODE STATUS: DNR Diet recommendation: Regular diet  History of present illness:  Rita Torres is an 87 year old female with past medical history significant for dementia with behavioral disturbance, vitamin D deficiency who presents to [facility] ED from [facility] memory care unit following fall.  Patient was found to have an impacted subcapital right femoral neck fracture.  Orthopedics was consulted and patient underwent cannulated hip pinning by Dr. Eulah Pont on 02/12/2021.  Awaiting SNF placement.  Hospital course:  Right femoral neck fracture Patient presenting to ED from [facility] memory care unit following fall at facility.  Imaging notable for impacted subcapital right femoral neck fracture.  Orthopedics was consulted and patient underwent cannulated hip pinning by Dr. Eulah Pont on 02/12/2021.  Patient is weightbearing as tolerated to right lower extremity.  Continue Tylenol as needed for pain control with recommendation to avoid narcotics.  Continue Xarelto 10 mg p.o. daily x30 days for DVT prophylaxis per orthopedics.  Outpatient follow-up with Dr. Eulah Pont 2 weeks for postoperative check.  Discharging to [facility] healthcare SNF.   Somnolence secondary to narcotic effect: Resolved Patient more somnolent, less responsive on 11/9, with pupils noted to be pinpoint.  Earlier in the hospitalization, patient received several doses of fentanyl, morphine, Norco, tramadol which is likely  contributing factor. Received Narcan x2 with improvement.  Discontinued narcotics and Remeron.  Recommend Tylenol as needed for pain control.   Acute postoperative blood loss anemia Hemoglobin on admission, 10.0, dropped to 9.0 postoperatively; but now appears stable.  Hemoglobin 10.5 at time of discharge.   Dementia with behavioral disturbance Patient currently resides at [facility] in memory care unit. Namenda 10 mg p.o. twice daily, Aricept 10 mg p.o. nightly, Depakote 250 mg p.o. nightly (Depakote level < 10 on 11/10). Home Remeron discontinued due to somnolence   Vitamin D deficiency: Cholecalciferol 1000 units p.o. daily   Depression/anxiety: Citalopram 10 mg p.o. daily    Discharge Diagnoses:  Principal Problem:   Fracture of femoral neck, right, closed (HCC) Active Problems:   Dementia with behavioral disturbance   Generalized weakness   Goals of care, counseling/discussion   Pressure injury of skin    Discharge Instructions  Discharge Instructions     Diet - low sodium heart healthy   Complete by: As directed    Discharge instructions   Complete by: As directed    Please follow up with orthopedics in 2 week.   Discharge wound care:   Complete by: As directed    Reinforce dressing.   Increase activity slowly   Complete by: As directed    No wound care   Complete by: As directed       Allergies as of 02/17/2021       Reactions   Penicillins Rash   Did it involve swelling of the face/tongue/throat, SOB, or low BP? N Did it involve sudden or severe rash/hives, skin peeling, or any reaction on the inside of your mouth or nose? N Did you need to seek medical attention at a hospital or doctor's office? N When did it last  happen?       If all above answers are "NO", may proceed with cephalosporin use.        Medication List     STOP taking these medications    mirtazapine 7.5 MG tablet Commonly known as: REMERON       TAKE these medications     acetaminophen 325 MG tablet Commonly known as: TYLENOL Take 650 mg by mouth in the morning and at bedtime. What changed: Another medication with the same name was added. Make sure you understand how and when to take each.   acetaminophen 325 MG tablet Commonly known as: TYLENOL Take 650 mg by mouth 2 (two) times daily as needed for moderate pain or mild pain. Use caution with APAP, total daily dose greater than 3000 MG What changed: Another medication with the same name was added. Make sure you understand how and when to take each.   acetaminophen 500 MG tablet Commonly known as: TYLENOL Take 1 tablet (500 mg total) by mouth every 6 (six) hours as needed for mild pain or moderate pain. What changed: You were already taking a medication with the same name, and this prescription was added. Make sure you understand how and when to take each.   citalopram 10 MG tablet Commonly known as: CeleXA Take 1 tablet (10 mg total) by mouth daily.   divalproex 250 MG 24 hr tablet Commonly known as: DEPAKOTE ER TAKE 1 TABLET BY MOUTH EVERY DAY AT NIGHT What changed:  how much to take how to take this when to take this additional instructions   docusate sodium 100 MG capsule Commonly known as: COLACE Take 1 capsule (100 mg total) by mouth 2 (two) times daily.   donepezil 10 MG tablet Commonly known as: Aricept Take 1 tablet every night What changed:  how much to take how to take this when to take this   feeding supplement Liqd Take 237 mLs by mouth 2 (two) times daily between meals.   loperamide 2 MG capsule Commonly known as: IMODIUM Take 2-4 mg by mouth See admin instructions. Take 2 tablets by mouth after initial loose stool, then 1 tablet after each additional. Do not exceed 4 tablets in 24 hours.   memantine 10 MG tablet Commonly known as: NAMENDA 1 TABLET TWICE A DAY What changed:  how much to take how to take this when to take this additional instructions    multivitamin with minerals Tabs tablet Take 1 tablet by mouth daily.   polyethylene glycol 17 g packet Commonly known as: MIRALAX / GLYCOLAX Take 17 g by mouth daily as needed for mild constipation.   rivaroxaban 10 MG Tabs tablet Commonly known as: XARELTO Take 1 tablet (10 mg total) by mouth daily.   rivaroxaban 10 MG Tabs tablet Commonly known as: XARELTO Take 1 tablet (10 mg total) by mouth daily. For DVT prophylaxis after surgery.   senna 8.6 MG Tabs tablet Commonly known as: SENOKOT Take 1 tablet (8.6 mg total) by mouth 2 (two) times daily.   Vitamin D3 25 MCG tablet Commonly known as: Vitamin D Take 1 tablet (1,000 Units total) by mouth daily.               Discharge Care Instructions  (From admission, onward)           Start     Ordered   02/14/21 0000  Discharge wound care:       Comments: Reinforce dressing.   02/14/21 1220  Contact information for follow-up providers     Daisy Floro, MD. Schedule an appointment as soon as possible for a visit in 1 week(s).   Specialty: Family Medicine Contact information: [address] [place] [address] [phone]         Sheral Apley, MD. Schedule an appointment as soon as possible for a visit.   Specialty: Orthopedic Surgery Contact information: [address] [place] [address] [phone]              Contact information for after-discharge care     Destination     [facility] Preferred SNF .   Service: Skilled Nursing Contact information: [address] [place] [address] [phone]                    Allergies  Allergen Reactions   Penicillins Rash    Did it involve swelling of the face/tongue/throat, SOB, or low BP? N Did it involve sudden or severe rash/hives, skin peeling, or any reaction on the inside of your mouth or nose? N Did you need to seek medical attention at a  hospital or doctor's office? N When did it last happen?       If all above answers are "NO", may proceed with cephalosporin use.     Consultations: Orthopedics, Dr. Eulah Pont   Procedures/Studies: DG Chest 1 View  Result Date: 02/11/2021 CLINICAL DATA:  Fall, hip pain.  Slipped out of bed. EXAM: CHEST  1 VIEW COMPARISON:  Radiograph 06/08/2020 FINDINGS: Lung volumes are low. There is mild elevation of the right hemidiaphragm. Multiple skin folds project over the left chest. No pneumothorax, large pleural effusion or acute airspace disease. Stable heart size and mediastinal contours. No acute osseous findings. IMPRESSION: Low lung volumes without acute abnormality. Electronically Signed   By: Narda Rutherford M.D.   On: 02/11/2021 23:06   CT HEAD WO CONTRAST  Result Date: 02/11/2021 CLINICAL DATA:  Recent fall with headaches, initial encounter EXAM: CT HEAD WITHOUT CONTRAST TECHNIQUE: Contiguous axial images were obtained from the base of the skull through the vertex without intravenous contrast. COMPARISON:  06/08/2020 FINDINGS: Brain: No evidence of acute infarction, hemorrhage, hydrocephalus, extra-axial collection or mass lesion/mass effect. Mild atrophic changes and chronic white matter ischemic changes are noted. Vascular: No hyperdense vessel or unexpected calcification. Skull: Normal. Negative for fracture or focal lesion. Sinuses/Orbits: No acute finding. Other: None. IMPRESSION: Chronic atrophic and ischemic changes stable in appearance from the prior exam. No acute abnormality noted. Electronically Signed   By: Alcide Clever M.D.   On: 02/11/2021 23:36   CT Cervical Spine Wo Contrast  Result Date: 02/11/2021 CLINICAL DATA:  Trauma. EXAM: CT CERVICAL SPINE WITHOUT CONTRAST TECHNIQUE: Multidetector CT imaging of the cervical spine was performed without intravenous contrast. Multiplanar CT image reconstructions were also generated. COMPARISON:  Cervical spine CT dated 06/08/2020. FINDINGS:  Evaluation of this exam is limited due to motion artifact. Alignment: No acute subluxation. There is straightening of normal cervical lordosis which may be positional or due to muscle spasm. Skull base and vertebrae: No acute fracture. Soft tissues and spinal canal: No prevertebral fluid or swelling. No visible canal hematoma. Disc levels:  Multilevel degenerative changes. Upper chest: Negative. Other: None IMPRESSION: 1. No acute/traumatic cervical spine pathology. 2. Multilevel degenerative changes detailed by level above. Electronically Signed   By: Elgie Collard M.D.   On: 02/11/2021 23:49   DG C-Arm 1-60 Min-No Report  Result Date: 02/12/2021 Fluoroscopy was utilized by the requesting physician.  No radiographic interpretation.   DG HIP OPERATIVE UNILAT W OR W/O PELVIS RIGHT  Result Date: 02/12/2021 CLINICAL DATA:  Right hip fracture. EXAM: OPERATIVE RIGHT HIP (WITH PELVIS IF PERFORMED) - 2 VIEWS TECHNIQUE: Fluoroscopic spot image(s) were submitted for interpretation post-operatively. COMPARISON:  None. FINDINGS: Two fluoroscopic spot images show placement of 3 compression screws across the subcapital right femoral neck fracture, which is in near anatomic alignment. No evidence of dislocation. IMPRESSION: Internal fixation of right femoral neck fracture in near anatomic alignment. Electronically Signed   By: Danae Orleans M.D.   On: 02/12/2021 13:55   DG Hip Unilat With Pelvis 2-3 Views Right  Result Date: 02/11/2021 CLINICAL DATA:  Fall and right hip pain. EXAM: DG HIP (WITH OR WITHOUT PELVIS) 2-3V RIGHT COMPARISON:  Pelvic radiograph dated 03/22/2020. FINDINGS: There is an impacted subcapital fracture of the right femoral neck. CT may provide better evaluation. No dislocation. The bones are osteopenic. The soft tissues are unremarkable. IMPRESSION: Impacted subcapital fracture of the right femoral neck. Electronically Signed   By: Elgie Collard M.D.   On: 02/11/2021 23:06      Subjective: Patient seen examined at bedside, resting comfortably.  Pleasantly confused.  No family present.  Discharging to SNF today.  Denies chest pain, no shortness of breath, no abdominal pain.  No acute events overnight per nursing staff.  Discharge Exam: Vitals:   02/16/21 2040 02/17/21 0432  BP: 112/86 137/69  Pulse: 80 71  Resp: 16 16  Temp: 98.6 F (37 C) 98 F (36.7 C)  SpO2: 95% 97%   Vitals:   02/16/21 0656 02/16/21 1307 02/16/21 2040 02/17/21 0432  BP: (!) 169/86 116/80 112/86 137/69  Pulse: 76 81 80 71  Resp: 18 14 16 16   Temp:  98.1 F (36.7 C) 98.6 F (37 C) 98 F (36.7 C)  TempSrc:   Axillary Oral  SpO2: 95% 96% 95% 97%  Weight:      Height:        General: Pt is alert, awake, not in acute distress, pleasantly confused Cardiovascular: RRR, S1/S2 +, no rubs, no gallops Respiratory: CTA bilaterally, no wheezing, no rhonchi, on room air Abdominal: Soft, NT, ND, bowel sounds + Extremities: no edema, no cyanosis    The results of significant diagnostics from this hospitalization (including imaging, microbiology, ancillary and laboratory) are listed below for reference.     Microbiology: Recent Results (from the past 240 hour(s))  Resp Panel by RT-PCR (Flu A&B, Covid) Nasopharyngeal Swab     Status: None   Collection Time: 02/12/21 12:05 AM   Specimen: Nasopharyngeal Swab; Nasopharyngeal(NP) swabs in vial transport medium  Result Value Ref Range Status   SARS Coronavirus 2 by RT PCR NEGATIVE NEGATIVE Final    Comment: (NOTE) SARS-CoV-2 target nucleic acids are NOT DETECTED.  The SARS-CoV-2 RNA is generally detectable in upper respiratory specimens during the acute phase of infection. The lowest concentration of SARS-CoV-2 viral copies this assay can detect is 138 copies/mL. A negative result does not preclude SARS-Cov-2 infection and should not be used as the sole basis for treatment or other patient management decisions. A negative result may  occur with  improper specimen collection/handling, submission of specimen other than nasopharyngeal swab, presence of viral mutation(s) within the areas targeted by this assay, and inadequate number of viral copies(<138 copies/mL). A negative result must be combined with clinical observations, patient history, and epidemiological information. The expected result  is Negative.  Fact Sheet for Patients:  BloggerCourse.com  Fact Sheet for Healthcare Providers:  SeriousBroker.it  This test is no t yet approved or cleared by the [place] FDA and  has been authorized for detection and/or diagnosis of SARS-CoV-2 by FDA under an Emergency Use Authorization (EUA). This EUA will remain  in effect (meaning this test can be used) for the duration of the COVID-19 declaration under Section 564(b)(1) of the Act, 21 U.S.C.section 360bbb-3(b)(1), unless the authorization is terminated  or revoked sooner.       Influenza A by PCR NEGATIVE NEGATIVE Final   Influenza B by PCR NEGATIVE NEGATIVE Final    Comment: (NOTE) The Xpert Xpress SARS-CoV-2/FLU/RSV plus assay is intended as an aid in the diagnosis of influenza from Nasopharyngeal swab specimens and should not be used as a sole basis for treatment. Nasal washings and aspirates are unacceptable for Xpert Xpress SARS-CoV-2/FLU/RSV testing.  Fact Sheet for Patients: BloggerCourse.com  Fact Sheet for Healthcare Providers: SeriousBroker.it  This test is not yet approved or cleared by the [place] FDA and has been authorized for detection and/or diagnosis of SARS-CoV-2 by FDA under an Emergency Use Authorization (EUA). This EUA will remain in effect (meaning this test can be used) for the duration of the COVID-19 declaration under Section 564(b)(1) of the Act, 21 U.S.C. section 360bbb-3(b)(1), unless the authorization is terminated  or revoked.  Performed at [facility], [address], [place], [place] [address]   Urine Culture     Status: Abnormal   Collection Time: 02/12/21  9:08 AM   Specimen: Urine, Clean Catch  Result Value Ref Range Status   Specimen Description   Final    URINE, CLEAN CATCH Performed at [facility], [address] [place], [place] [address]    Special Requests   Final    NONE Performed at [facility], [address], [place], [place] [address]    Culture MULTIPLE SPECIES PRESENT, SUGGEST RECOLLECTION (A)  Final   Report Status 02/13/2021 FINAL  Final  Surgical pcr screen     Status: None   Collection Time: 02/12/21 10:10 AM   Specimen: Nasal Mucosa; Nasal Swab  Result Value Ref Range Status   MRSA, PCR NEGATIVE NEGATIVE Final   Staphylococcus aureus NEGATIVE NEGATIVE Final    Comment: (NOTE) The Xpert SA Assay (FDA approved for NASAL specimens in patients 64 years of age and older), is one component of a comprehensive surveillance program. It is not intended to diagnose infection nor to guide or monitor treatment. Performed at [facility], [address] [place], [place] [address]   Resp Panel by RT-PCR (Flu A&B, Covid) Nasopharyngeal Swab     Status: None   Collection Time: 02/16/21  5:00 PM   Specimen: Nasopharyngeal Swab; Nasopharyngeal(NP) swabs in vial transport medium  Result Value Ref Range Status   SARS Coronavirus 2 by RT PCR NEGATIVE NEGATIVE Final    Comment: (NOTE) SARS-CoV-2 target nucleic acids are NOT DETECTED.  The SARS-CoV-2 RNA is generally detectable in upper respiratory specimens during the acute phase of infection. The lowest concentration of SARS-CoV-2 viral copies this assay can detect is 138 copies/mL. A negative result does not preclude SARS-Cov-2 infection and should not be used as the sole basis for treatment or other patient management decisions. A negative result  may occur with  improper specimen collection/handling, submission of specimen other than nasopharyngeal swab, presence of viral mutation(s) within the areas  targeted by this assay, and inadequate number of viral copies(<138 copies/mL). A negative result must be combined with clinical observations, patient history, and epidemiological information. The expected result is Negative.  Fact Sheet for Patients:  BloggerCourse.com  Fact Sheet for Healthcare Providers:  SeriousBroker.it  This test is no t yet approved or cleared by the [place] FDA and  has been authorized for detection and/or diagnosis of SARS-CoV-2 by FDA under an Emergency Use Authorization (EUA). This EUA will remain  in effect (meaning this test can be used) for the duration of the COVID-19 declaration under Section 564(b)(1) of the Act, 21 U.S.C.section 360bbb-3(b)(1), unless the authorization is terminated  or revoked sooner.       Influenza A by PCR NEGATIVE NEGATIVE Final   Influenza B by PCR NEGATIVE NEGATIVE Final    Comment: (NOTE) The Xpert Xpress SARS-CoV-2/FLU/RSV plus assay is intended as an aid in the diagnosis of influenza from Nasopharyngeal swab specimens and should not be used as a sole basis for treatment. Nasal washings and aspirates are unacceptable for Xpert Xpress SARS-CoV-2/FLU/RSV testing.  Fact Sheet for Patients: BloggerCourse.com  Fact Sheet for Healthcare Providers: SeriousBroker.it  This test is not yet approved or cleared by the [place] FDA and has been authorized for detection and/or diagnosis of SARS-CoV-2 by FDA under an Emergency Use Authorization (EUA). This EUA will remain in effect (meaning this test can be used) for the duration of the COVID-19 declaration under Section 564(b)(1) of the Act, 21 U.S.C. section 360bbb-3(b)(1), unless the authorization is terminated  or revoked.  Performed at [facility], [address], [place], [place] [address]      Labs: BNP (last 3 results) No results for input(s): BNP in the last 8760 hours. Basic Metabolic Panel: Recent Labs  Lab 02/11/21 2250 02/12/21 0322 02/13/21 0323 02/14/21 0321 02/16/21 0320  NA 142 140 139 140 140  K 4.2 3.9 4.1 4.8 4.5  CL 106 108 105 106 105  CO2 30 28 28 27 26   GLUCOSE 118* 107* 122* 100* 89  BUN 30* 26* 21 21 24*  CREATININE 0.68 0.56 0.70 0.71 0.63  CALCIUM 8.7* 8.6* 8.3* 8.6* 8.8*  MG  --   --   --   --  2.5*   Liver Function Tests: Recent Labs  Lab 02/12/21 0322 02/16/21 0320  AST 19 25  ALT 14 14  ALKPHOS 147* 156*  BILITOT 0.6 0.9  PROT 5.6* 6.1*  ALBUMIN 2.9* 3.1*   No results for input(s): LIPASE, AMYLASE in the last 168 hours. Recent Labs  Lab 02/14/21 1723  AMMONIA 28   CBC: Recent Labs  Lab 02/11/21 2250 02/12/21 0322 02/13/21 0323 02/14/21 0321 02/15/21 0333 02/16/21 0320  WBC 7.8 5.2 6.4 5.4 6.0 6.5  NEUTROABS 6.2 3.3  --   --   --   --   HGB 10.0* 9.4* 9.0* 9.7* 9.3* 10.5*  HCT 31.9* 30.3* 29.3* 31.2* 30.7* 34.8*  MCV 99.7 98.7 99.0 100.3* 100.7* 100.6*  PLT 134* 133* 121* 119* 123* 141*   Cardiac Enzymes: No results for input(s): CKTOTAL, CKMB, CKMBINDEX, TROPONINI in the last 168 hours. BNP: Invalid input(s): POCBNP CBG: No results for input(s): GLUCAP in the last 168 hours. D-Dimer No results for input(s): DDIMER in the last 72 hours. Hgb A1c No results for input(s): HGBA1C in the last 72 hours. Lipid Profile No results for input(s): CHOL, HDL, LDLCALC, TRIG, CHOLHDL, LDLDIRECT in the last 72 hours. Thyroid function studies Recent  Labs    02/14/21 1723  TSH 4.702*   Anemia work up Recent Labs    02/14/21 1723  VITAMINB12 247   Urinalysis    Component Value Date/Time   COLORURINE YELLOW 02/12/2021 0908   APPEARANCEUR HAZY (A) 02/12/2021 0908   LABSPEC 1.018 02/12/2021 0908   PHURINE  7.0 02/12/2021 0908   GLUCOSEU NEGATIVE 02/12/2021 0908   HGBUR NEGATIVE 02/12/2021 0908   BILIRUBINUR NEGATIVE 02/12/2021 0908   KETONESUR NEGATIVE 02/12/2021 0908   PROTEINUR NEGATIVE 02/12/2021 0908   UROBILINOGEN 1 12/29/2013 1433   NITRITE NEGATIVE 02/12/2021 0908   LEUKOCYTESUR NEGATIVE 02/12/2021 0908   Sepsis Labs Invalid input(s): PROCALCITONIN,  WBC,  LACTICIDVEN Microbiology Recent Results (from the past 240 hour(s))  Resp Panel by RT-PCR (Flu A&B, Covid) Nasopharyngeal Swab     Status: None   Collection Time: 02/12/21 12:05 AM   Specimen: Nasopharyngeal Swab; Nasopharyngeal(NP) swabs in vial transport medium  Result Value Ref Range Status   SARS Coronavirus 2 by RT PCR NEGATIVE NEGATIVE Final    Comment: (NOTE) SARS-CoV-2 target nucleic acids are NOT DETECTED.  The SARS-CoV-2 RNA is generally detectable in upper respiratory specimens during the acute phase of infection. The lowest concentration of SARS-CoV-2 viral copies this assay can detect is 138 copies/mL. A negative result does not preclude SARS-Cov-2 infection and should not be used as the sole basis for treatment or other patient management decisions. A negative result may occur with  improper specimen collection/handling, submission of specimen other than nasopharyngeal swab, presence of viral mutation(s) within the areas targeted by this assay, and inadequate number of viral copies(<138 copies/mL). A negative result must be combined with clinical observations, patient history, and epidemiological information. The expected result is Negative.  Fact Sheet for Patients:  BloggerCourse.com  Fact Sheet for Healthcare Providers:  SeriousBroker.it  This test is no t yet approved or cleared by the [place] FDA and  has been authorized for detection and/or diagnosis of SARS-CoV-2 by FDA under an Emergency Use Authorization (EUA). This EUA will remain  in  effect (meaning this test can be used) for the duration of the COVID-19 declaration under Section 564(b)(1) of the Act, 21 U.S.C.section 360bbb-3(b)(1), unless the authorization is terminated  or revoked sooner.       Influenza A by PCR NEGATIVE NEGATIVE Final   Influenza B by PCR NEGATIVE NEGATIVE Final    Comment: (NOTE) The Xpert Xpress SARS-CoV-2/FLU/RSV plus assay is intended as an aid in the diagnosis of influenza from Nasopharyngeal swab specimens and should not be used as a sole basis for treatment. Nasal washings and aspirates are unacceptable for Xpert Xpress SARS-CoV-2/FLU/RSV testing.  Fact Sheet for Patients: BloggerCourse.com  Fact Sheet for Healthcare Providers: SeriousBroker.it  This test is not yet approved or cleared by the [place] FDA and has been authorized for detection and/or diagnosis of SARS-CoV-2 by FDA under an Emergency Use Authorization (EUA). This EUA will remain in effect (meaning this test can be used) for the duration of the COVID-19 declaration under Section 564(b)(1) of the Act, 21 U.S.C. section 360bbb-3(b)(1), unless the authorization is terminated or revoked.  Performed at [facility], [address] [place], [place] [address]   Urine Culture     Status: Abnormal   Collection Time: 02/12/21  9:08 AM   Specimen: Urine, Clean Catch  Result Value Ref Range Status   Specimen Description   Final    URINE, CLEAN CATCH Performed at [facility], [address] [place], [place] [address]    Special Requests   Final    NONE Performed at [facility], [address] [place], [place] [address]    Culture MULTIPLE SPECIES PRESENT, SUGGEST RECOLLECTION (A)  Final   Report Status 02/13/2021 FINAL  Final  Surgical pcr screen     Status: None   Collection Time: 02/12/21 10:10 AM   Specimen: Nasal Mucosa; Nasal Swab  Result Value Ref  Range Status   MRSA, PCR NEGATIVE NEGATIVE Final   Staphylococcus aureus NEGATIVE NEGATIVE Final    Comment: (NOTE) The Xpert SA Assay (FDA approved for NASAL specimens in patients 30 years of age and older), is one component of a comprehensive surveillance program. It is not intended to diagnose infection nor to guide or monitor treatment. Performed at [facility], [address], [place], [place] [address]   Resp Panel by RT-PCR (Flu A&B, Covid) Nasopharyngeal Swab     Status: None   Collection Time: 02/16/21  5:00 PM   Specimen: Nasopharyngeal Swab; Nasopharyngeal(NP) swabs in vial transport medium  Result Value Ref Range Status   SARS Coronavirus 2 by RT PCR NEGATIVE NEGATIVE Final    Comment: (NOTE) SARS-CoV-2 target nucleic acids are NOT DETECTED.  The SARS-CoV-2 RNA is generally detectable in upper respiratory specimens during the acute phase of infection. The lowest concentration of SARS-CoV-2 viral copies this assay can detect is 138 copies/mL. A negative result does not preclude SARS-Cov-2 infection and should not be used as the sole basis for treatment or other patient management decisions. A negative result may occur with  improper specimen collection/handling, submission of specimen other than nasopharyngeal swab, presence of viral mutation(s) within the areas targeted by this assay, and inadequate number of viral copies(<138 copies/mL). A negative result must be combined with clinical observations, patient history, and epidemiological information. The expected result is Negative.  Fact Sheet for Patients:  BloggerCourse.com  Fact Sheet for Healthcare Providers:  SeriousBroker.it  This test is no t yet approved or cleared by the [place] FDA and  has been authorized for detection and/or diagnosis of SARS-CoV-2 by FDA under an Emergency Use Authorization (EUA). This EUA will remain  in  effect (meaning this test can be used) for the duration of the COVID-19 declaration under Section 564(b)(1) of the Act, 21 U.S.C.section 360bbb-3(b)(1), unless the authorization is terminated  or revoked sooner.       Influenza A by PCR NEGATIVE NEGATIVE Final   Influenza B by PCR NEGATIVE NEGATIVE Final    Comment: (NOTE) The Xpert Xpress SARS-CoV-2/FLU/RSV plus assay is intended as an aid in the diagnosis of influenza from Nasopharyngeal swab specimens and should not be used as a sole basis for treatment. Nasal washings and aspirates are unacceptable for Xpert Xpress SARS-CoV-2/FLU/RSV testing.  Fact Sheet for Patients: BloggerCourse.com  Fact Sheet for Healthcare Providers: SeriousBroker.it  This test is not yet approved or cleared by the [place] FDA and has been authorized for detection and/or diagnosis of SARS-CoV-2 by FDA under an Emergency Use Authorization (EUA). This EUA will remain in effect (meaning this test can be used) for the duration of the COVID-19 declaration under Section 564(b)(1) of the Act, 21 U.S.C. section 360bbb-3(b)(1), unless the authorization is terminated or revoked.  Performed at [facility], [address], [place], [place] [address]      Time coordinating discharge: Over 30 minutes  SIGNED:   Alvira Philips [place], DO  [facility]  02/17/2021, 9:17 AM

## 2021-02-17 NOTE — Progress Notes (Signed)
RN has called [facility] to give report several times and they did not answer. EMS was given the number for [facility] to call the hospital to get report.

## 2021-02-20 ENCOUNTER — Encounter (HOSPITAL_COMMUNITY): Payer: Self-pay | Admitting: Orthopedic Surgery

## 2021-02-21 DIAGNOSIS — F03918 Unspecified dementia, unspecified severity, with other behavioral disturbance: Secondary | ICD-10-CM | POA: Diagnosis not present

## 2021-02-21 DIAGNOSIS — S72001D Fracture of unspecified part of neck of right femur, subsequent encounter for closed fracture with routine healing: Secondary | ICD-10-CM | POA: Diagnosis not present

## 2021-02-21 DIAGNOSIS — M6281 Muscle weakness (generalized): Secondary | ICD-10-CM | POA: Diagnosis not present

## 2021-02-21 DIAGNOSIS — F411 Generalized anxiety disorder: Secondary | ICD-10-CM | POA: Diagnosis not present

## 2021-02-23 DIAGNOSIS — S72001D Fracture of unspecified part of neck of right femur, subsequent encounter for closed fracture with routine healing: Secondary | ICD-10-CM | POA: Diagnosis not present

## 2021-02-23 DIAGNOSIS — F03918 Unspecified dementia, unspecified severity, with other behavioral disturbance: Secondary | ICD-10-CM | POA: Diagnosis not present

## 2021-02-23 DIAGNOSIS — M6281 Muscle weakness (generalized): Secondary | ICD-10-CM | POA: Diagnosis not present

## 2021-03-01 DIAGNOSIS — S72001D Fracture of unspecified part of neck of right femur, subsequent encounter for closed fracture with routine healing: Secondary | ICD-10-CM | POA: Diagnosis not present

## 2021-03-01 DIAGNOSIS — M6281 Muscle weakness (generalized): Secondary | ICD-10-CM | POA: Diagnosis not present

## 2021-03-01 DIAGNOSIS — E46 Unspecified protein-calorie malnutrition: Secondary | ICD-10-CM | POA: Diagnosis not present

## 2021-03-01 DIAGNOSIS — F03918 Unspecified dementia, unspecified severity, with other behavioral disturbance: Secondary | ICD-10-CM | POA: Diagnosis not present

## 2021-03-06 DIAGNOSIS — F03918 Unspecified dementia, unspecified severity, with other behavioral disturbance: Secondary | ICD-10-CM | POA: Diagnosis not present

## 2021-03-06 DIAGNOSIS — F0394 Unspecified dementia, unspecified severity, with anxiety: Secondary | ICD-10-CM | POA: Diagnosis not present

## 2021-03-06 DIAGNOSIS — S72001D Fracture of unspecified part of neck of right femur, subsequent encounter for closed fracture with routine healing: Secondary | ICD-10-CM | POA: Diagnosis not present

## 2021-03-06 DIAGNOSIS — F0393 Unspecified dementia, unspecified severity, with mood disturbance: Secondary | ICD-10-CM | POA: Diagnosis not present

## 2021-03-15 DIAGNOSIS — R42 Dizziness and giddiness: Secondary | ICD-10-CM | POA: Diagnosis not present

## 2021-03-15 DIAGNOSIS — F0280 Dementia in other diseases classified elsewhere without behavioral disturbance: Secondary | ICD-10-CM | POA: Diagnosis not present

## 2021-03-15 DIAGNOSIS — Z0000 Encounter for general adult medical examination without abnormal findings: Secondary | ICD-10-CM | POA: Diagnosis not present

## 2021-03-15 DIAGNOSIS — N39498 Other specified urinary incontinence: Secondary | ICD-10-CM | POA: Diagnosis not present

## 2021-04-12 DIAGNOSIS — F0280 Dementia in other diseases classified elsewhere without behavioral disturbance: Secondary | ICD-10-CM | POA: Diagnosis not present

## 2021-04-12 DIAGNOSIS — M25569 Pain in unspecified knee: Secondary | ICD-10-CM | POA: Diagnosis not present

## 2021-04-12 DIAGNOSIS — N39498 Other specified urinary incontinence: Secondary | ICD-10-CM | POA: Diagnosis not present

## 2021-04-12 DIAGNOSIS — F0390 Unspecified dementia without behavioral disturbance: Secondary | ICD-10-CM | POA: Diagnosis not present

## 2021-04-19 ENCOUNTER — Emergency Department (HOSPITAL_COMMUNITY): Payer: Medicare Other

## 2021-04-19 ENCOUNTER — Inpatient Hospital Stay (HOSPITAL_COMMUNITY)
Admission: EM | Admit: 2021-04-19 | Discharge: 2021-04-22 | DRG: 481 | Payer: Medicare Other | Attending: Internal Medicine | Admitting: Internal Medicine

## 2021-04-19 ENCOUNTER — Inpatient Hospital Stay (HOSPITAL_COMMUNITY): Payer: Medicare Other | Admitting: Anesthesiology

## 2021-04-19 ENCOUNTER — Other Ambulatory Visit: Payer: Self-pay

## 2021-04-19 ENCOUNTER — Encounter (HOSPITAL_COMMUNITY): Payer: Self-pay | Admitting: *Deleted

## 2021-04-19 DIAGNOSIS — Z20822 Contact with and (suspected) exposure to covid-19: Secondary | ICD-10-CM | POA: Diagnosis present

## 2021-04-19 DIAGNOSIS — S0232XA Fracture of orbital floor, left side, initial encounter for closed fracture: Secondary | ICD-10-CM | POA: Diagnosis not present

## 2021-04-19 DIAGNOSIS — D539 Nutritional anemia, unspecified: Secondary | ICD-10-CM | POA: Diagnosis present

## 2021-04-19 DIAGNOSIS — F32A Depression, unspecified: Secondary | ICD-10-CM

## 2021-04-19 DIAGNOSIS — S72002D Fracture of unspecified part of neck of left femur, subsequent encounter for closed fracture with routine healing: Secondary | ICD-10-CM | POA: Diagnosis not present

## 2021-04-19 DIAGNOSIS — F03C3 Unspecified dementia, severe, with mood disturbance: Secondary | ICD-10-CM | POA: Diagnosis present

## 2021-04-19 DIAGNOSIS — Z7901 Long term (current) use of anticoagulants: Secondary | ICD-10-CM

## 2021-04-19 DIAGNOSIS — F03918 Unspecified dementia, unspecified severity, with other behavioral disturbance: Secondary | ICD-10-CM | POA: Diagnosis not present

## 2021-04-19 DIAGNOSIS — Z79899 Other long term (current) drug therapy: Secondary | ICD-10-CM | POA: Diagnosis not present

## 2021-04-19 DIAGNOSIS — E860 Dehydration: Secondary | ICD-10-CM | POA: Diagnosis present

## 2021-04-19 DIAGNOSIS — R58 Hemorrhage, not elsewhere classified: Secondary | ICD-10-CM | POA: Diagnosis not present

## 2021-04-19 DIAGNOSIS — Z23 Encounter for immunization: Secondary | ICD-10-CM

## 2021-04-19 DIAGNOSIS — S72002A Fracture of unspecified part of neck of left femur, initial encounter for closed fracture: Secondary | ICD-10-CM | POA: Diagnosis not present

## 2021-04-19 DIAGNOSIS — D696 Thrombocytopenia, unspecified: Secondary | ICD-10-CM | POA: Diagnosis present

## 2021-04-19 DIAGNOSIS — Z419 Encounter for procedure for purposes other than remedying health state, unspecified: Secondary | ICD-10-CM

## 2021-04-19 DIAGNOSIS — Z880 Allergy status to penicillin: Secondary | ICD-10-CM | POA: Diagnosis not present

## 2021-04-19 DIAGNOSIS — R609 Edema, unspecified: Secondary | ICD-10-CM | POA: Diagnosis not present

## 2021-04-19 DIAGNOSIS — Z681 Body mass index (BMI) 19 or less, adult: Secondary | ICD-10-CM

## 2021-04-19 DIAGNOSIS — F419 Anxiety disorder, unspecified: Secondary | ICD-10-CM | POA: Diagnosis not present

## 2021-04-19 DIAGNOSIS — S0181XA Laceration without foreign body of other part of head, initial encounter: Secondary | ICD-10-CM | POA: Diagnosis present

## 2021-04-19 DIAGNOSIS — Z66 Do not resuscitate: Secondary | ICD-10-CM | POA: Diagnosis present

## 2021-04-19 DIAGNOSIS — M80052A Age-related osteoporosis with current pathological fracture, left femur, initial encounter for fracture: Principal | ICD-10-CM | POA: Diagnosis present

## 2021-04-19 DIAGNOSIS — F03C4 Unspecified dementia, severe, with anxiety: Secondary | ICD-10-CM | POA: Diagnosis present

## 2021-04-19 DIAGNOSIS — R1310 Dysphagia, unspecified: Secondary | ICD-10-CM | POA: Diagnosis present

## 2021-04-19 DIAGNOSIS — Z8249 Family history of ischemic heart disease and other diseases of the circulatory system: Secondary | ICD-10-CM

## 2021-04-19 DIAGNOSIS — W19XXXA Unspecified fall, initial encounter: Secondary | ICD-10-CM | POA: Diagnosis not present

## 2021-04-19 DIAGNOSIS — R404 Transient alteration of awareness: Secondary | ICD-10-CM | POA: Diagnosis not present

## 2021-04-19 DIAGNOSIS — F03C18 Unspecified dementia, severe, with other behavioral disturbance: Secondary | ICD-10-CM | POA: Diagnosis present

## 2021-04-19 DIAGNOSIS — R627 Adult failure to thrive: Secondary | ICD-10-CM | POA: Diagnosis present

## 2021-04-19 DIAGNOSIS — Y92129 Unspecified place in nursing home as the place of occurrence of the external cause: Secondary | ICD-10-CM | POA: Diagnosis not present

## 2021-04-19 LAB — BASIC METABOLIC PANEL
Anion gap: 8 (ref 5–15)
BUN: 35 mg/dL — ABNORMAL HIGH (ref 8–23)
CO2: 26 mmol/L (ref 22–32)
Calcium: 8.9 mg/dL (ref 8.9–10.3)
Chloride: 106 mmol/L (ref 98–111)
Creatinine, Ser: 0.76 mg/dL (ref 0.44–1.00)
GFR, Estimated: >60 mL/min (ref >60)
Glucose, Bld: 123 mg/dL — ABNORMAL HIGH (ref 70–99)
Potassium: 4.2 mmol/L (ref 3.5–5.1)
Sodium: 140 mmol/L (ref 135–145)

## 2021-04-19 LAB — RESP PANEL BY RT-PCR (FLU A&B, COVID) ARPGX2
Influenza A by PCR: NEGATIVE
Influenza B by PCR: NEGATIVE
SARS Coronavirus 2 by RT PCR: NEGATIVE

## 2021-04-19 LAB — CBC WITH DIFFERENTIAL/PLATELET
Abs Immature Granulocytes: 0.02 10*3/uL (ref 0.00–0.07)
Basophils Absolute: 0 10*3/uL (ref 0.0–0.1)
Basophils Relative: 0 %
Eosinophils Absolute: 0 10*3/uL (ref 0.0–0.5)
Eosinophils Relative: 0 %
HCT: 35.5 % — ABNORMAL LOW (ref 36.0–46.0)
Hemoglobin: 10.3 g/dL — ABNORMAL LOW (ref 12.0–15.0)
Immature Granulocytes: 0 %
Lymphocytes Relative: 8 %
Lymphs Abs: 0.6 10*3/uL — ABNORMAL LOW (ref 0.7–4.0)
MCH: 29.9 pg (ref 26.0–34.0)
MCHC: 29 g/dL — ABNORMAL LOW (ref 30.0–36.0)
MCV: 102.9 fL — ABNORMAL HIGH (ref 80.0–100.0)
Monocytes Absolute: 0.4 10*3/uL (ref 0.1–1.0)
Monocytes Relative: 5 %
Neutro Abs: 6.4 10*3/uL (ref 1.7–7.7)
Neutrophils Relative %: 87 %
Platelets: 89 10*3/uL — ABNORMAL LOW (ref 150–400)
RBC: 3.45 MIL/uL — ABNORMAL LOW (ref 3.87–5.11)
RDW: 14.1 % (ref 11.5–15.5)
WBC: 7.4 10*3/uL (ref 4.0–10.5)
nRBC: 0 % (ref 0.0–0.2)

## 2021-04-19 LAB — TYPE AND SCREEN
ABO/RH(D): A POS
Antibody Screen: NEGATIVE

## 2021-04-19 LAB — GLUCOSE, CAPILLARY: Glucose-Capillary: 100 mg/dL — ABNORMAL HIGH (ref 70–99)

## 2021-04-19 MED ORDER — SODIUM CHLORIDE 0.9 % IV SOLN: INTRAVENOUS | Status: AC

## 2021-04-19 MED ORDER — VITAMIN D 25 MCG (1000 UNIT) PO TABS
1000.0000 [IU] | ORAL_TABLET | Freq: Every day | ORAL | Status: DC
  Administered 2021-04-22: 1000 [IU] via ORAL
  Filled 2021-04-19 (×3): qty 1

## 2021-04-19 MED ORDER — DIVALPROEX SODIUM ER 250 MG PO TB24
250.0000 mg | ORAL_TABLET | Freq: Every day | ORAL | Status: DC
  Administered 2021-04-21 – 2021-04-22 (×2): 250 mg via ORAL
  Filled 2021-04-19 (×4): qty 1

## 2021-04-19 MED ORDER — LIDOCAINE-EPINEPHRINE (PF) 2 %-1:200000 IJ SOLN
INTRAMUSCULAR | Status: AC
  Filled 2021-04-19: qty 20

## 2021-04-19 MED ORDER — ROPIVACAINE HCL 5 MG/ML IJ SOLN
INTRAMUSCULAR | Status: DC | PRN
  Administered 2021-04-19: 30 mL via PERINEURAL

## 2021-04-19 MED ORDER — DOCUSATE SODIUM 100 MG PO CAPS
100.0000 mg | ORAL_CAPSULE | Freq: Two times a day (BID) | ORAL | Status: DC
  Filled 2021-04-19: qty 1

## 2021-04-19 MED ORDER — TRAMADOL HCL 50 MG PO TABS: 50.0000 mg | ORAL_TABLET | Freq: Four times a day (QID) | ORAL | Status: DC | PRN

## 2021-04-19 MED ORDER — CLONIDINE HCL (ANALGESIA) 100 MCG/ML EP SOLN
EPIDURAL | Status: DC | PRN
  Administered 2021-04-19: 50 ug

## 2021-04-19 MED ORDER — FENTANYL CITRATE (PF) 100 MCG/2ML IJ SOLN
INTRAMUSCULAR | Status: AC
  Filled 2021-04-19: qty 2

## 2021-04-19 MED ORDER — SODIUM CHLORIDE 0.9 % IV SOLN
INTRAVENOUS | Status: DC
Start: 2021-04-19 — End: 2021-04-19

## 2021-04-19 MED ORDER — TETANUS-DIPHTH-ACELL PERTUSSIS 5-2.5-18.5 LF-MCG/0.5 IM SUSY
0.5000 mL | PREFILLED_SYRINGE | Freq: Once | INTRAMUSCULAR | Status: AC
  Administered 2021-04-19: 0.5 mL via INTRAMUSCULAR
  Filled 2021-04-19: qty 0.5

## 2021-04-19 MED ORDER — FENTANYL CITRATE PF 50 MCG/ML IJ SOSY
0.5000 ug | PREFILLED_SYRINGE | Freq: Once | INTRAMUSCULAR | Status: AC
  Administered 2021-04-19: 50 ug via INTRAVENOUS

## 2021-04-19 MED ORDER — MORPHINE SULFATE (PF) 2 MG/ML IV SOLN: 0.5000 mg | INTRAVENOUS | Status: DC | PRN

## 2021-04-19 MED ORDER — CITALOPRAM HYDROBROMIDE 20 MG PO TABS
10.0000 mg | ORAL_TABLET | Freq: Every day | ORAL | Status: DC
  Administered 2021-04-21 – 2021-04-22 (×2): 10 mg via ORAL
  Filled 2021-04-19 (×3): qty 1

## 2021-04-19 MED ORDER — CHLORHEXIDINE GLUCONATE 4 % EX LIQD: 60.0000 mL | Freq: Once | CUTANEOUS | Status: DC

## 2021-04-19 MED ORDER — POVIDONE-IODINE 10 % EX SWAB
2.0000 | Freq: Once | CUTANEOUS | Status: AC
  Administered 2021-04-20: 2 via TOPICAL

## 2021-04-19 MED ORDER — SENNA 8.6 MG PO TABS
1.0000 | ORAL_TABLET | Freq: Two times a day (BID) | ORAL | Status: DC
  Administered 2021-04-21 – 2021-04-22 (×3): 8.6 mg via ORAL
  Filled 2021-04-19 (×5): qty 1

## 2021-04-19 MED ORDER — LORAZEPAM 0.5 MG PO TABS
0.2500 mg | ORAL_TABLET | Freq: Two times a day (BID) | ORAL | Status: DC
  Administered 2021-04-21 – 2021-04-22 (×4): 0.25 mg via ORAL
  Filled 2021-04-19 (×4): qty 1

## 2021-04-19 MED ORDER — DONEPEZIL HCL 10 MG PO TABS
10.0000 mg | ORAL_TABLET | Freq: Every day | ORAL | Status: DC
  Administered 2021-04-21 – 2021-04-22 (×2): 10 mg via ORAL
  Filled 2021-04-19 (×3): qty 1

## 2021-04-19 MED ORDER — CEFAZOLIN SODIUM-DEXTROSE 2-4 GM/100ML-% IV SOLN
2.0000 g | INTRAVENOUS | Status: AC
  Administered 2021-04-20: 2 g via INTRAVENOUS
  Filled 2021-04-19: qty 100

## 2021-04-19 MED ORDER — MEMANTINE HCL 10 MG PO TABS
10.0000 mg | ORAL_TABLET | Freq: Two times a day (BID) | ORAL | Status: DC
  Administered 2021-04-19 – 2021-04-22 (×5): 10 mg via ORAL
  Filled 2021-04-19 (×6): qty 1

## 2021-04-19 MED ORDER — LORAZEPAM 2 MG/ML IJ SOLN
0.2500 mg | Freq: Four times a day (QID) | INTRAMUSCULAR | Status: DC | PRN
  Administered 2021-04-19: 0.25 mg via INTRAVENOUS
  Filled 2021-04-19: qty 1

## 2021-04-19 MED ORDER — ENSURE ENLIVE PO LIQD
Freq: Two times a day (BID) | ORAL | Status: DC
  Administered 2021-04-22: 237 mL via ORAL
  Filled 2021-04-19: qty 237

## 2021-04-19 MED ORDER — CHLORHEXIDINE GLUCONATE 4 % EX LIQD
60.0000 mL | Freq: Once | CUTANEOUS | Status: DC
  Filled 2021-04-19: qty 60

## 2021-04-19 NOTE — Plan of Care (Signed)
  Problem: Education: Goal: Knowledge of General Education information will improve Description: Including pain rating scale, medication(s)/side effects and non-pharmacologic comfort measures Outcome: Progressing   Problem: Health Behavior/Discharge Planning: Goal: Ability to manage health-related needs will improve Outcome: Progressing   Problem: Clinical Measurements: Goal: Diagnostic test results will improve Outcome: Progressing   

## 2021-04-19 NOTE — Consult Note (Signed)
Reason for Consult: Facial trauma Referring Physician: Trauma service  Rita Torres is an 88 y.o. female.  HPI: 88 year old female who fell suffering a hip fracture, facial laceration, and left orbital floor fracture.  She has severe dementia and is currently being boarded in the emergency room awaiting orthopedic surgery.  Her son is in the room during our encounter.  She is a poor historian and difficult to examine due to her dementia.  Past Medical History:  Diagnosis Date   Dementia (HCC)    Dyspnea on exertion    Grief reaction    Memory loss    Osteopenia 04/2015   t score -1.8    Past Surgical History:  Procedure Laterality Date   ABDOMINAL HYSTERECTOMY     BSO   HIP PINNING,CANNULATED Right 02/12/2021   Procedure: CANNULATED HIP PINNING;  Surgeon: Sheral ApleyMurphy, Timothy D, MD;  Location: [facility];  Service: Orthopedics;  Laterality: Right;   OOPHORECTOMY     BSO    Family History  Problem Relation Age of Onset   Heart disease Father     Social History:  reports that she has never smoked. She has never used smokeless tobacco. She reports that she does not drink alcohol and does not use drugs.  Allergies:  Allergies  Allergen Reactions   Penicillins Rash    Tolerated Ancef. Did it involve swelling of the face/tongue/throat, SOB, or low BP? N Did it involve sudden or severe rash/hives, skin peeling, or any reaction on the inside of your mouth or nose? N Did you need to seek medical attention at a hospital or doctor's office? N When did it last happen?       If all above answers are "NO", may proceed with cephalosporin use.     Medications: I have reviewed the patient's current medications.  Results for orders placed or performed during the hospital encounter of 04/19/21 (from the past 48 hour(s))  CBC with Differential     Status: Abnormal   Collection Time: 04/19/21  2:41 AM  Result Value Ref Range   WBC 7.4 4.0 - 10.5 K/uL   RBC 3.45 (L) 3.87 - 5.11 MIL/uL    Hemoglobin 10.3 (L) 12.0 - 15.0 g/dL   HCT 16.135.5 (L) 09.636.0 - 04.546.0 %   MCV 102.9 (H) 80.0 - 100.0 fL   MCH 29.9 26.0 - 34.0 pg   MCHC 29.0 (L) 30.0 - 36.0 g/dL   RDW 40.914.1 81.111.5 - 91.415.5 %   Platelets 89 (L) 150 - 400 K/uL    Comment: Immature Platelet Fraction may be clinically indicated, consider ordering this additional test [id number] REPEATED TO VERIFY PLATELET COUNT CONFIRMED BY SMEAR    nRBC 0.0 0.0 - 0.2 %   Neutrophils Relative % 87 %   Neutro Abs 6.4 1.7 - 7.7 K/uL   Lymphocytes Relative 8 %   Lymphs Abs 0.6 (L) 0.7 - 4.0 K/uL   Monocytes Relative 5 %   Monocytes Absolute 0.4 0.1 - 1.0 K/uL   Eosinophils Relative 0 %   Eosinophils Absolute 0.0 0.0 - 0.5 K/uL   Basophils Relative 0 %   Basophils Absolute 0.0 0.0 - 0.1 K/uL   WBC Morphology MORPHOLOGY UNREMARKABLE    RBC Morphology MORPHOLOGY UNREMARKABLE    Smear Review PLATELET COUNT CONFIRMED BY SMEAR    Immature Granulocytes 0 %   Abs Immature Granulocytes 0.02 0.00 - 0.07 K/uL    Comment: Performed at [facility], [address] [place], [place]  [address]  Basic metabolic panel     Status: Abnormal   Collection Time: 04/19/21  2:41 AM  Result Value Ref Range   Sodium 140 135 - 145 mmol/L   Potassium 4.2 3.5 - 5.1 mmol/L   Chloride 106 98 - 111 mmol/L   CO2 26 22 - 32 mmol/L   Glucose, Bld 123 (H) 70 - 99 mg/dL    Comment: Glucose reference range applies only to samples taken after fasting for at least 8 hours.   BUN 35 (H) 8 - 23 mg/dL   Creatinine, Ser 8.89 0.44 - 1.00 mg/dL   Calcium 8.9 8.9 - 16.9 mg/dL   GFR, Estimated >45 >03 mL/min    Comment: (NOTE) Calculated using the CKD-EPI Creatinine Equation (2021)    Anion gap 8 5 - 15    Comment: Performed at [facility], [address] [place] [address] [place], [place] [address]  Resp Panel by RT-PCR (Flu A&B, Covid) Nasopharyngeal Swab     Status: None   Collection Time: 04/19/21  6:08 AM   Specimen: Nasopharyngeal Swab; Nasopharyngeal(NP) swabs in vial transport  medium  Result Value Ref Range   SARS Coronavirus 2 by RT PCR NEGATIVE NEGATIVE    Comment: (NOTE) SARS-CoV-2 target nucleic acids are NOT DETECTED.  The SARS-CoV-2 RNA is generally detectable in upper respiratory specimens during the acute phase of infection. The lowest concentration of SARS-CoV-2 viral copies this assay can detect is 138 copies/mL. A negative result does not preclude SARS-Cov-2 infection and should not be used as the sole basis for treatment or other patient management decisions. A negative result may occur with  improper specimen collection/handling, submission of specimen other than nasopharyngeal swab, presence of viral mutation(s) within the areas targeted by this assay, and inadequate number of viral copies(<138 copies/mL). A negative result must be combined with clinical observations, patient history, and epidemiological information. The expected result is Negative.  Fact Sheet for Patients:  BloggerCourse.com  Fact Sheet for Healthcare Providers:  SeriousBroker.it  This test is no t yet approved or cleared by the [place] FDA and  has been authorized for detection and/or diagnosis of SARS-CoV-2 by FDA under an Emergency Use Authorization (EUA). This EUA will remain  in effect (meaning this test can be used) for the duration of the COVID-19 declaration under Section 564(b)(1) of the Act, 21 U.S.C.section 360bbb-3(b)(1), unless the authorization is terminated  or revoked sooner.       Influenza A by PCR NEGATIVE NEGATIVE   Influenza B by PCR NEGATIVE NEGATIVE    Comment: (NOTE) The Xpert Xpress SARS-CoV-2/FLU/RSV plus assay is intended as an aid in the diagnosis of influenza from Nasopharyngeal swab specimens and should not be used as a sole basis for treatment. Nasal washings and aspirates are unacceptable for Xpert Xpress SARS-CoV-2/FLU/RSV testing.  Fact Sheet for  Patients: BloggerCourse.com  Fact Sheet for Healthcare Providers: SeriousBroker.it  This test is not yet approved or cleared by the [place] FDA and has been authorized for detection and/or diagnosis of SARS-CoV-2 by FDA under an Emergency Use Authorization (EUA). This EUA will remain in effect (meaning this test can be used) for the duration of the COVID-19 declaration under Section 564(b)(1) of the Act, 21 U.S.C. section 360bbb-3(b)(1), unless the authorization is terminated or revoked.  Performed at [facility], [address], [place], [place] [address]   Type and screen [facility]     Status: None   Collection Time: 04/19/21  8:00  AM  Result Value Ref Range   ABO/RH(D) A POS    Antibody Screen NEG    Sample Expiration      04/22/2021,2359 Performed at [facility], [address] [place], [place] [address]     CT HEAD WO CONTRAST (5MM)  Result Date: 04/19/2021 CLINICAL DATA:  Head trauma, minor (Age >= 65y) Head trauma, moderate-severe EXAM: CT HEAD WITHOUT CONTRAST TECHNIQUE: Contiguous axial images were obtained from the base of the skull through the vertex without intravenous contrast. RADIATION DOSE REDUCTION: This exam was performed according to the departmental dose-optimization program which includes automated exposure control, adjustment of the mA and/or kV according to patient size and/or use of iterative reconstruction technique. COMPARISON:  02/11/2021 FINDINGS: Brain: There is atrophy and chronic small vessel disease changes. No acute intracranial abnormality. Specifically, no hemorrhage, hydrocephalus, mass lesion, acute infarction, or significant intracranial injury. Vascular: No hyperdense vessel or unexpected calcification. Skull: No acute calvarial abnormality. Sinuses/Orbits: Air-fluid level in the left maxillary sinus. Fracture through the floor of the left orbit seen on coronal  imaging. Mildly depressed fracture fragments. No evidence of entrapment. Other: Soft tissue swelling over the left orbit. IMPRESSION: Atrophy, chronic microvascular disease. No acute intracranial abnormality. Fracture through the floor the left orbit. Mildly depressed fracture fragments without evidence of entrapment. Overlying soft tissue swelling. Electronically Signed   By: Charlett NoseKevin  Dover M.D.   On: 04/19/2021 03:09   CT Cervical Spine Wo Contrast  Result Date: 04/19/2021 CLINICAL DATA:  Neck trauma (Age >= 65y) EXAM: CT CERVICAL SPINE WITHOUT CONTRAST TECHNIQUE: Multidetector CT imaging of the cervical spine was performed without intravenous contrast. Multiplanar CT image reconstructions were also generated. RADIATION DOSE REDUCTION: This exam was performed according to the departmental dose-optimization program which includes automated exposure control, adjustment of the mA and/or kV according to patient size and/or use of iterative reconstruction technique. COMPARISON:  02/11/2021 FINDINGS: Alignment: Normal Skull base and vertebrae: No acute fracture. No primary bone lesion or focal pathologic process. Soft tissues and spinal canal: No prevertebral fluid or swelling. No visible canal hematoma. Disc levels:  Diffuse degenerative disc disease and facet disease. Upper chest: No acute findings Other: Left orbital floor fracture again noted as seen on head CT with blood layering in the left maxillary sinus. IMPRESSION: No acute bony abnormality in the cervical spine. Left orbital floor fracture with layering blood in the left maxillary sinus. Electronically Signed   By: Charlett NoseKevin  Dover M.D.   On: 04/19/2021 03:11   DG Pelvis Portable  Result Date: 04/19/2021 CLINICAL DATA:  Fall with EXAM: PORTABLE PELVIS 1-2 VIEWS COMPARISON:  Right hip radiograph dated 02/11/2021 and pelvic radiograph dated 03/22/2020. FINDINGS: Evaluation is very limited due to positioning. Prior fixation screw of the right femoral neck  fracture. The screws appear intact as visualized. There is foreshortened appearance of the left femoral neck with an area of angulation. Although this may be positional, a femoral neck fracture is not excluded. Further evaluation with CT is recommended. The bones are osteopenic. No dislocation. The soft tissues are unremarkable. IMPRESSION: Findings concerning for fracture of the left femoral neck. Further evaluation with CT recommended. Electronically Signed   By: Elgie CollardArash  Radparvar M.D.   On: 04/19/2021 02:54   CT Hip Left Wo Contrast  Result Date: 04/19/2021 CLINICAL DATA:  Hip trauma with fracture suspected. X-ray completed. EXAM: CT OF THE LEFT HIP WITHOUT CONTRAST TECHNIQUE: Multidetector CT imaging of the left hip was performed according to the standard protocol. Multiplanar  CT image reconstructions were also generated. RADIATION DOSE REDUCTION: This exam was performed according to the departmental dose-optimization program which includes automated exposure control, adjustment of the mA and/or kV according to patient size and/or use of iterative reconstruction technique. COMPARISON:  Preceding radiography FINDINGS: Bones/Joint/Cartilage Subcapital left femoral neck fracture with posterior impaction. Mild for age degenerative spurring at the hip joint. No visible pelvic ring fracture. Ligaments Suboptimally assessed by CT. Muscles and Tendons No evidence of major muscular ligamentous disruption. Soft tissues Expected swelling around the injury. The rectum and distal sigmoid are distended by stool. IMPRESSION: Subcapital left femoral neck fracture with posterolateral impaction. Electronically Signed   By: Tiburcio Pea M.D.   On: 04/19/2021 05:56   DG Chest Portable 1 View  Result Date: 04/19/2021 CLINICAL DATA:  Fall. EXAM: PORTABLE CHEST 1 VIEW COMPARISON:  Chest radiograph dated 02/11/2021. FINDINGS: No focal consolidation, pleural effusion or pneumothorax. Mild cardiomegaly. No acute osseous  pathology. IMPRESSION: 1. No acute cardiopulmonary process. 2. Mild cardiomegaly. Electronically Signed   By: Elgie Collard M.D.   On: 04/19/2021 02:52    Review of Systems Blood pressure 129/76, pulse 74, temperature (!) 96.8 F (36 C), temperature source Temporal, resp. rate 14, height 5\' 5"  (1.651 m), weight 55 kg, SpO2 96 %. Physical Exam  Confused, frail female lying supine in bed Responds to voice but remains confused - son reports this is baseline Breathes comfortably without audible stridor Laceration over left brow has been closed nicely prior to my arrival Although it is a difficult exam, she appears to have normal eye movement of both eyes in all directions and no evidence of entrapment. Cranial nerves seem intact She has no palpable step-off at the infraorbital rim Mandible is nontender Oral cavity unremarkable Pinna normal without mastoid tenderness Chest symmetric expansions bilaterally without use of accessory muscles  CT scan -I took a look at the films and the report.  I agree with the radiologist dictation citing a left orbital floor fracture without evidence of entrapment.  Assessment/Plan:  Left orbital floor fracture  Given her comorbidities, use of platelet inhibitors, and absence of ocular entrapment on exam and scan, this does not need to be fixed acutely.  My guess is this will heal without needing anything done whatsoever; however, she should be reevaluated by ENT in 1 week particularly if having any trouble with diplopia or pain with eye movement.  Please reconsult as needed.  04/19/2021, 4:10 PM

## 2021-04-19 NOTE — ED Notes (Addendum)
Trauma Response Nurse Documentation   Rita Torres is a 88 y.o. female arriving to [facility] ED via EMS  On Eliquis (apixaban) daily. Trauma was activated as a Level 2 by ED charge RN based on the following trauma criteria: Elderly patients > 65 with head trauma on anti-coagulation (excluding ASA). Trauma team at the bedside on patient arrival. Patient cleared for CT by Dr. Wilkie Aye. Patient to CT with team. GCS 11.  History   Past Medical History:  Diagnosis Date   Dementia (HCC)    Dyspnea on exertion    Grief reaction    Memory loss    Osteopenia 04/2015   t score -1.8     Past Surgical History:  Procedure Laterality Date   ABDOMINAL HYSTERECTOMY     BSO   HIP PINNING,CANNULATED Right 02/12/2021   Procedure: CANNULATED HIP PINNING;  Surgeon: Sheral Apley, MD;  Location: [facility];  Service: Orthopedics;  Laterality: Right;   OOPHORECTOMY     BSO       Initial Focused Assessment (If applicable, or please see trauma documentation): Confused female arrives via EMS, c-collar in place, bruising to left eye, laceration left forehead at eyebrow. Bruising to left hand and bilateral knees. Hx dementia, per chart review baseline alert x2.   CT's Completed:   CT Head and CT C-Spine   Interventions:  TDAP, wound care, CT, portable XRAY, IV access, trauma lab draw  Plan for disposition:  Pending CT  Laceration repair  Consults completed:  none at time of this note.  Event Summary: Patient arrives via EMS from [facility], pt found down on the floor on her left side in a pool of blood. Unknown downtime. Bruising/swelling noted to left eye, patient unable to open. Right eye PERRL 4mm. Bruising to bilateral knees and left hand. Alert to self and birthdate only at this time. Patient initially GCS 11 upon arrival, improved to 14 at time of this note (E4, V4, M6). Initial pelvis XRAY concerning for fx, anticipate CT for dispo.  MTP Summary (If applicable): NA  Bedside  handoff with ED RN [place].    Kyen Taite O Charlot Gouin  Trauma Response RN  Please call TRN at [phone] for further assistance.

## 2021-04-19 NOTE — ED Provider Notes (Signed)
I performed lac repair to L forehead per request of Dr. Dina Rich  .Marland KitchenLaceration Repair  Date/Time: 04/19/2021 6:51 AM Performed by: Domenic Moras, PA-C Authorized by: Domenic Moras, PA-C   Consent:    Consent obtained:  Verbal   Consent given by:  Rita Torres   Risks discussed:  Infection, need for additional repair, pain, poor cosmetic result and poor wound healing   Alternatives discussed:  No treatment and delayed treatment Universal protocol:    Procedure explained and questions answered to Rita Torres or proxy's satisfaction: yes     Relevant documents present and verified: yes     Test results available: yes     Imaging studies available: yes     Required blood products, implants, devices, and special equipment available: yes     Site/side marked: yes     Immediately prior to procedure, a time out was called: yes     Rita Torres identity confirmed:  Verbally with Rita Torres Anesthesia:    Anesthesia method:  Local infiltration   Local anesthetic:  Lidocaine 2% WITH epi Laceration details:    Location:  Face   Face location:  Forehead   Length (cm):  3   Depth (mm):  4 Pre-procedure details:    Preparation:  Rita Torres was prepped and draped in usual sterile fashion and imaging obtained to evaluate for foreign bodies Exploration:    Limited defect created (wound extended): no     Hemostasis achieved with:  Epinephrine   Imaging outcome: foreign body not noted     Wound exploration: wound explored through full range of motion and entire depth of wound visualized     Contaminated: no   Treatment:    Area cleansed with:  Povidone-iodine and saline   Amount of cleaning:  Standard   Irrigation solution:  Sterile saline   Irrigation method:  Pressure wash   Visualized foreign bodies/material removed: no     Debridement:  None Skin repair:    Repair method:  Sutures   Suture size:  5-0   Suture material:  Prolene   Suture technique:  Simple interrupted   Number of sutures:  6 Approximation:     Approximation:  Close Repair type:    Repair type:  Intermediate Post-procedure details:    Dressing:  Non-adherent dressing   Procedure completion:  Heloise Ochoa, PA-C 04/19/21 [id number]    Dina Rich, Barbette Hair, MD 04/19/21 [phone]

## 2021-04-19 NOTE — ED Triage Notes (Signed)
Pt arrived by [facility] from [facility]. Unwitnessed fall, unknown last seen by staff. Hx of dementia, c-collar in place. Facility MAR has pt taking Eliquis. Bruising and laceration noted to L face

## 2021-04-19 NOTE — ED Provider Notes (Addendum)
[facility] EMERGENCY DEPARTMENT Provider Note   CSN: [id number] Arrival date & time: 04/19/21  0236     History  Chief Complaint  Patient presents with   [name]    Rita Torres is a 88 y.o. female.  HPI     This is an 88 year old female with a history of dementia and osteopenia who presents as a level 2 trauma with head injury.  Per EMS report, she had an unwitnessed fall.  She was last seen well anywhere from 20 minutes to 1.5 hours prior.  She was laying on her left side per EMS.  Notable facial trauma.  In route she had normal vital signs.  GCS of 12.  She is on Eliquis.  Patient is noncontributory to history taking  Level 5 caveat for dementia  Home Medications Prior to Admission medications   Medication Sig Start Date End Date Taking? Authorizing Provider  acetaminophen (TYLENOL) 325 MG tablet Take 650 mg by mouth in the morning and at bedtime.   Yes [provider]  apixaban (ELIQUIS) 5 MG TABS tablet Take 5 mg by mouth 2 (two) times daily.   Yes [provider]  cholecalciferol (VITAMIN D) 25 MCG tablet Take 1 tablet (1,000 Units total) by mouth daily. 02/17/21  Yes [place] Eric J, DO  citalopram (CELEXA) 10 MG tablet Take 1 tablet (10 mg total) by mouth daily. 02/26/20  Yes Cameron Sprang, MD  divalproex (DEPAKOTE ER) 250 MG 24 hr tablet TAKE 1 TABLET BY MOUTH EVERY DAY AT NIGHT Patient taking differently: Take 250 mg by mouth at bedtime. 02/26/20  Yes Cameron Sprang, MD  docusate sodium (COLACE) 100 MG capsule Take 1 capsule (100 mg total) by mouth 2 (two) times daily. 02/14/21  Yes Hosie Poisson, MD  donepezil (ARICEPT) 10 MG tablet Take 1 tablet every night Patient taking differently: Take 10 mg by mouth daily. Take 1 tablet every night 02/26/20  Yes Cameron Sprang, MD  furosemide (LASIX) 20 MG tablet Take 10 mg by mouth daily.   Yes [provider]  LORazepam (ATIVAN) 0.5 MG tablet Take 0.25 mg by mouth 2 (two) times  daily.   Yes [provider]  memantine (NAMENDA) 10 MG tablet 1 TABLET TWICE A DAY Patient taking differently: Take 10 mg by mouth 2 (two) times daily. 02/26/20  Yes Cameron Sprang, MD  Multiple Vitamin (MULTIVITAMIN WITH MINERALS) TABS tablet Take 1 tablet by mouth daily. 02/15/21  Yes Hosie Poisson, MD  Nutritional Supplements (ENSURE ORIGINAL PO) Take 237 mLs by mouth in the morning and at bedtime. vanilla   Yes [provider]  senna (SENOKOT) 8.6 MG TABS tablet Take 1 tablet (8.6 mg total) by mouth 2 (two) times daily. 02/14/21  Yes Hosie Poisson, MD  acetaminophen (TYLENOL) 325 MG tablet Take 650 mg by mouth 2 (two) times daily as needed for moderate pain or mild pain. Use caution with APAP, total daily dose greater than 3000 MG Patient not taking: Reported on 04/19/2021    [provider]  acetaminophen (TYLENOL) 500 MG tablet Take 1 tablet (500 mg total) by mouth every 6 (six) hours as needed for mild pain or moderate pain. Patient not taking: Reported on 04/19/2021 02/15/21   Britt Bottom, PA-C  feeding supplement (ENSURE SURGERY) LIQD Take 237 mLs by mouth 2 (two) times daily between meals. Patient not taking: Reported on 04/19/2021 02/14/21   Hosie Poisson, MD  loperamide (IMODIUM) 2 MG capsule Take  2-4 mg by mouth See admin instructions. Take 2 tablets by mouth after initial loose stool, then 1 tablet after each additional. Do not exceed 4 tablets in 24 hours. Patient not taking: Reported on 04/19/2021    [provider]  polyethylene glycol (MIRALAX / GLYCOLAX) 17 g packet Take 17 g by mouth daily as needed for mild constipation. Patient not taking: Reported on 04/19/2021 02/14/21   Hosie Poisson, MD  rivaroxaban (XARELTO) 10 MG TABS tablet Take 1 tablet (10 mg total) by mouth daily. For DVT prophylaxis after surgery. Patient not taking: Reported on 04/19/2021 02/15/21   Britt Bottom, PA-C      Allergies    Penicillins    Review of Systems   Review  of Systems  Unable to perform ROS: Dementia   Physical Exam Updated Vital Signs BP (!) 105/55    Pulse 64    Temp (!) 97.1 F (36.2 C) (Temporal)    Resp 11    Ht 1.651 m (5\' 5" )    Wt 55 kg    SpO2 97%    BMI 20.18 kg/m  Physical Exam Vitals and nursing note reviewed.  Constitutional:      Appearance: She is well-developed.  HENT:     Head: Normocephalic.     Comments: Extensive bruising and swelling noted to the left side of the face and orbit, 3 cm gaping laceration left forehead, no active bleeding, dried blood noted left side of the face and ears    Mouth/Throat:     Mouth: Mucous membranes are moist.  Eyes:     Pupils: Pupils are equal, round, and reactive to light.     Comments: Right pupil 4 mm and reactive, left pupil unable to assess secondary to swelling  Neck:     Comments: C-collar in place Cardiovascular:     Rate and Rhythm: Normal rate and regular rhythm.     Heart sounds: Normal heart sounds.  Pulmonary:     Effort: Pulmonary effort is normal. No respiratory distress.     Breath sounds: No wheezing.     Comments: Chest wall tenderness of left ribs with some mild bruising noted, no crepitus Chest:     Chest wall: Tenderness present.  Abdominal:     Palpations: Abdomen is soft.     Tenderness: There is no abdominal tenderness.  Musculoskeletal:        General: No deformity.     Comments: Patient in position of comfort with knees and hips flexed, no obvious tenderness with palpation, difficult to range, appears neurovascular intact, no obvious deformity  Skin:    General: Skin is warm and dry.  Neurological:     Mental Status: She is alert.     Comments: Alert, will not answer orientation questions, appears to move all 4 extremities  Psychiatric:        Mood and Affect: Mood normal.    ED Results / Procedures / Treatments   Labs (all labs ordered are listed, but only abnormal results are displayed) Labs Reviewed  CBC WITH DIFFERENTIAL/PLATELET -  Abnormal; Notable for the following components:      Result Value   RBC 3.45 (*)    Hemoglobin 10.3 (*)    HCT 35.5 (*)    MCV 102.9 (*)    MCHC 29.0 (*)    Platelets 89 (*)    Lymphs Abs 0.6 (*)    All other components within normal limits  BASIC METABOLIC PANEL - Abnormal; Notable  for the following components:   Glucose, Bld 123 (*)    BUN 35 (*)    All other components within normal limits  RESP PANEL BY RT-PCR (FLU A&B, COVID) [id number]    EKG None  Radiology CT HEAD WO CONTRAST (5MM)  Result Date: 04/19/2021 CLINICAL DATA:  Head trauma, minor (Age >= 65y) Head trauma, moderate-severe EXAM: CT HEAD WITHOUT CONTRAST TECHNIQUE: Contiguous axial images were obtained from the base of the skull through the vertex without intravenous contrast. RADIATION DOSE REDUCTION: This exam was performed according to the departmental dose-optimization program which includes automated exposure control, adjustment of the mA and/or kV according to patient size and/or use of iterative reconstruction technique. COMPARISON:  02/11/2021 FINDINGS: Brain: There is atrophy and chronic small vessel disease changes. No acute intracranial abnormality. Specifically, no hemorrhage, hydrocephalus, mass lesion, acute infarction, or significant intracranial injury. Vascular: No hyperdense vessel or unexpected calcification. Skull: No acute calvarial abnormality. Sinuses/Orbits: Air-fluid level in the left maxillary sinus. Fracture through the floor of the left orbit seen on coronal imaging. Mildly depressed fracture fragments. No evidence of entrapment. Other: Soft tissue swelling over the left orbit. IMPRESSION: Atrophy, chronic microvascular disease. No acute intracranial abnormality. Fracture through the floor the left orbit. Mildly depressed fracture fragments without evidence of entrapment. Overlying soft tissue swelling. Electronically Signed   By: Rolm Baptise M.D.   On: 04/19/2021 03:09   CT Cervical Spine Wo  Contrast  Result Date: 04/19/2021 CLINICAL DATA:  Neck trauma (Age >= 65y) EXAM: CT CERVICAL SPINE WITHOUT CONTRAST TECHNIQUE: Multidetector CT imaging of the cervical spine was performed without intravenous contrast. Multiplanar CT image reconstructions were also generated. RADIATION DOSE REDUCTION: This exam was performed according to the departmental dose-optimization program which includes automated exposure control, adjustment of the mA and/or kV according to patient size and/or use of iterative reconstruction technique. COMPARISON:  02/11/2021 FINDINGS: Alignment: Normal Skull base and vertebrae: No acute fracture. No primary bone lesion or focal pathologic process. Soft tissues and spinal canal: No prevertebral fluid or swelling. No visible canal hematoma. Disc levels:  Diffuse degenerative disc disease and facet disease. Upper chest: No acute findings Other: Left orbital floor fracture again noted as seen on head CT with blood layering in the left maxillary sinus. IMPRESSION: No acute bony abnormality in the cervical spine. Left orbital floor fracture with layering blood in the left maxillary sinus. Electronically Signed   By: Rolm Baptise M.D.   On: 04/19/2021 03:11   DG Pelvis Portable  Result Date: 04/19/2021 CLINICAL DATA:  Fall with EXAM: PORTABLE PELVIS 1-2 VIEWS COMPARISON:  Right hip radiograph dated 02/11/2021 and pelvic radiograph dated 03/22/2020. FINDINGS: Evaluation is very limited due to positioning. Prior fixation screw of the right femoral neck fracture. The screws appear intact as visualized. There is foreshortened appearance of the left femoral neck with an area of angulation. Although this may be positional, a femoral neck fracture is not excluded. Further evaluation with CT is recommended. The bones are osteopenic. No dislocation. The soft tissues are unremarkable. IMPRESSION: Findings concerning for fracture of the left femoral neck. Further evaluation with CT recommended.  Electronically Signed   By: Anner Crete M.D.   On: 04/19/2021 02:54   CT Hip Left Wo Contrast  Result Date: 04/19/2021 CLINICAL DATA:  Hip trauma with fracture suspected. X-ray completed. EXAM: CT OF THE LEFT HIP WITHOUT CONTRAST TECHNIQUE: Multidetector CT imaging of the left hip was performed according to the standard protocol. Multiplanar CT image reconstructions were  also generated. RADIATION DOSE REDUCTION: This exam was performed according to the departmental dose-optimization program which includes automated exposure control, adjustment of the mA and/or kV according to patient size and/or use of iterative reconstruction technique. COMPARISON:  Preceding radiography FINDINGS: Bones/Joint/Cartilage Subcapital left femoral neck fracture with posterior impaction. Mild for age degenerative spurring at the hip joint. No visible pelvic ring fracture. Ligaments Suboptimally assessed by CT. Muscles and Tendons No evidence of major muscular ligamentous disruption. Soft tissues Expected swelling around the injury. The rectum and distal sigmoid are distended by stool. IMPRESSION: Subcapital left femoral neck fracture with posterolateral impaction. Electronically Signed   By: Jorje Guild M.D.   On: 04/19/2021 05:56   DG Chest Portable 1 View  Result Date: 04/19/2021 CLINICAL DATA:  Fall. EXAM: PORTABLE CHEST 1 VIEW COMPARISON:  Chest radiograph dated 02/11/2021. FINDINGS: No focal consolidation, pleural effusion or pneumothorax. Mild cardiomegaly. No acute osseous pathology. IMPRESSION: 1. No acute cardiopulmonary process. 2. Mild cardiomegaly. Electronically Signed   By: Anner Crete M.D.   On: 04/19/2021 02:52    Procedures .Critical Care Performed by: Merryl Hacker, MD Authorized by: Merryl Hacker, MD   Critical care provider statement:    Critical care time (minutes):  30   Critical care was necessary to treat or prevent imminent or life-threatening deterioration of the  following conditions:  Trauma   Critical care was time spent personally by me on the following activities:  Development of treatment plan with patient or surrogate, discussions with consultants, evaluation of patient'Torres response to treatment, examination of patient, ordering and review of laboratory studies, ordering and review of radiographic studies, ordering and performing treatments and interventions, pulse oximetry, re-evaluation of patient'Torres condition and review of old charts    Medications Ordered in ED Medications  lidocaine-EPINEPHrine (XYLOCAINE W/EPI) 2 %-1:200000 (PF) injection (has no administration in time range)  Tdap (BOOSTRIX) injection 0.5 mL (0.5 mLs Intramuscular Given 04/19/21 0300)    ED Course/ Medical Decision Making/ A&P Clinical Course as of 04/19/21 0628  Wed Apr 19, 2021  0609 Spoke to Dr. Marcelline Deist, ENT.  Recommends 1 week follow-up in clinic. [facility] Spoke with patient'Torres daughter-in-law.  Discussed injuries.  They would like to pursue options for her fractured hip.  She is DNR otherwise.  Broke her right hip in November.  It was repaired by Dr. Percell Miller.  Per daughter-in-law, no preference regarding orthopedic surgeon. [CH]  [phone] Spoke with Dr. Lucia Gaskins.  Requested consultation with [facility] as they fixed her right hip just 2 months ago.  Have placed consult. [CH]  [id number] Spoke with Dr. French Ana.  They will consult on the patient for surgical repair. [CH]    Clinical Course User Index [CH] Nicanor Mendolia, Barbette Hair, MD                           Medical Decision Making  This patient presents to the ED for concern of trauma/injury, this involves an extensive number of treatment options, and is a complaint that carries with it a high risk of complications and morbidity.  The differential diagnosis includes intracranial hemorrhage, facial fractures, leg and hip fracture  MDM:    This is an 88 year old female who presents following a trauma.  She has notable injury to the  left face, left chest, left lower extremity.  ABCs are intact.  She is overall difficult to examine given her history of dementia.  She is on Eliquis at  rest.  Imaging including CT head, neck, chest x-ray, pelvis obtained.  CT head concerning for orbital floor fracture.  Difficult to assess for entrapment on clinical exam given swelling and difficulty following commands.  No entrapment on imaging.  No head bleed.  No cervical spine fracture.  No pneumothorax or pneumonia.  There is concern for potential left hip fracture.  CT ordered.  Labs obtained and largely reassuring.  CT shows a hip fracture.  COVID testing sent.  Spoke to the patient'Torres family and updated them on her progress.  We will consult ENT, orthopedics, hospitalist for admit. (Labs, imaging)  Labs: I Ordered, and personally interpreted labs.  The pertinent results include: CBC, BMP   Imaging Studies ordered: I ordered imaging studies including x-rays, CT I independently visualized and interpreted imaging. I agree with the radiologist interpretation  Critical Interventions: Pain control, tetanus   Consultations Obtained: I requested consultation with the orthopedics, ENT, hospitalist,  and discussed lab and imaging findings as well as pertinent plan - they recommend: Follow-up with ENT outpatient, orthopedics to assess, hospitalist to admit  Reevaluation: After the interventions noted above, I reevaluated the patient and found that they have :improved  Social Determinants of Health: Guarded, lives in facility  Disposition: Admit  Co morbidities that complicate the patient evaluation  Past Medical History:  Diagnosis Date   Dementia ([place])    Dyspnea on exertion    Grief reaction    Memory loss    Osteopenia 04/2015   t score -1.8      Additional history obtained from daughter-in-law External records from outside source obtained and reviewed including prior hospitalization   Cardiac Monitoring: The patient was  maintained on a cardiac monitor.  I personally viewed and interpreted the cardiac monitored which showed an underlying rhythm of: nsr   Medicines  Meds ordered this encounter  Medications   Tdap (BOOSTRIX) injection 0.5 mL   lidocaine-EPINEPHrine (XYLOCAINE W/EPI) 2 %-1:200000 (PF) injection    [place], Rita Torres: cabinet override     I have reviewed the patients home medicines and have made adjustments as needed   Problem List / ED Course: Problem List Items Addressed This Visit       Musculoskeletal and Integument   * (Principal) Closed left hip fracture ([place]) - Primary   Other Visit Diagnoses     Closed fracture of left orbital floor, initial encounter ([place])                       Final Clinical Impression(Torres) / ED Diagnoses Final diagnoses:  Closed fracture of left hip, initial encounter ([place])  Closed fracture of left orbital floor, initial encounter [facility])    Rx / DC Orders ED Discharge Orders     None         Min Collymore, Barbette Hair, MD 04/19/21 [id number]    Merryl Hacker, MD 04/19/21 [phone]

## 2021-04-19 NOTE — Consult Note (Signed)
Reason for Consult:Left hip fx Referring Physician: Gean Birchwood Time called: 0830 Time at bedside: [name] is an 88 y.o. female.  HPI: Rita Torres suffered an unwitnessed fall at the [facility]. She was brought to the ED where x-rays showed a left hip fx and orthopedic surgery was consulted. She has advanced dementia and cannot contribute to history or exam.  Past Medical History:  Diagnosis Date   Dementia ([place])    Dyspnea on exertion    Grief reaction    Memory loss    Osteopenia 04/2015   t score -1.8    Past Surgical History:  Procedure Laterality Date   ABDOMINAL HYSTERECTOMY     BSO   HIP PINNING,CANNULATED Right 02/12/2021   Procedure: CANNULATED HIP PINNING;  Surgeon: Renette Butters, MD;  Location: [facility];  Service: Orthopedics;  Laterality: Right;   OOPHORECTOMY     BSO    Family History  Problem Relation Age of Onset   Heart disease Father     Social History:  reports that she has never smoked. She has never used smokeless tobacco. She reports that she does not drink alcohol and does not use drugs.  Allergies:  Allergies  Allergen Reactions   Penicillins Rash    Did it involve swelling of the face/tongue/throat, SOB, or low BP? N Did it involve sudden or severe rash/hives, skin peeling, or any reaction on the inside of your mouth or nose? N Did you need to seek medical attention at a hospital or doctor's office? N When did it last happen?       If all above answers are NO, may proceed with cephalosporin use.     Medications: I have reviewed the patient's current medications.  Results for orders placed or performed during the hospital encounter of 04/19/21 (from the past 48 hour(s))  CBC with Differential     Status: Abnormal   Collection Time: 04/19/21  2:41 AM  Result Value Ref Range   WBC 7.4 4.0 - 10.5 K/uL   RBC 3.45 (L) 3.87 - 5.11 MIL/uL   Hemoglobin 10.3 (L) 12.0 - 15.0 g/dL   HCT 35.5 (L) 36.0 - 46.0 %   MCV  102.9 (H) 80.0 - 100.0 fL   MCH 29.9 26.0 - 34.0 pg   MCHC 29.0 (L) 30.0 - 36.0 g/dL   RDW 14.1 11.5 - 15.5 %   Platelets 89 (L) 150 - 400 K/uL    Comment: Immature Platelet Fraction may be clinically indicated, consider ordering this additional test [id number] REPEATED TO VERIFY PLATELET COUNT CONFIRMED BY SMEAR    nRBC 0.0 0.0 - 0.2 %   Neutrophils Relative % 87 %   Neutro Abs 6.4 1.7 - 7.7 K/uL   Lymphocytes Relative 8 %   Lymphs Abs 0.6 (L) 0.7 - 4.0 K/uL   Monocytes Relative 5 %   Monocytes Absolute 0.4 0.1 - 1.0 K/uL   Eosinophils Relative 0 %   Eosinophils Absolute 0.0 0.0 - 0.5 K/uL   Basophils Relative 0 %   Basophils Absolute 0.0 0.0 - 0.1 K/uL   WBC Morphology MORPHOLOGY UNREMARKABLE    RBC Morphology MORPHOLOGY UNREMARKABLE    Smear Review PLATELET COUNT CONFIRMED BY SMEAR    Immature Granulocytes 0 %   Abs Immature Granulocytes 0.02 0.00 - 0.07 K/uL    Comment: Performed at [facility], [address] [place], [place] [id number]  Basic metabolic panel     Status:  Abnormal   Collection Time: 04/19/21  2:41 AM  Result Value Ref Range   Sodium 140 135 - 145 mmol/L   Potassium 4.2 3.5 - 5.1 mmol/L   Chloride 106 98 - 111 mmol/L   CO2 26 22 - 32 mmol/L   Glucose, Bld 123 (H) 70 - 99 mg/dL    Comment: Glucose reference range applies only to samples taken after fasting for at least 8 hours.   BUN 35 (H) 8 - 23 mg/dL   Creatinine, Ser 0.76 0.44 - 1.00 mg/dL   Calcium 8.9 8.9 - 10.3 mg/dL   GFR, Estimated >60 >60 mL/min    Comment: (NOTE) Calculated using the CKD-EPI Creatinine Equation (2021)    Anion gap 8 5 - 15    Comment: Performed at [facility] [address], [place], [place] [address]  Resp Panel by RT-PCR (Flu A&B, Covid) Nasopharyngeal Swab     Status: None   Collection Time: 04/19/21  6:08 AM   Specimen: Nasopharyngeal Swab; Nasopharyngeal(NP) swabs in vial transport medium  Result Value Ref Range   SARS Coronavirus 2 by RT PCR NEGATIVE  NEGATIVE    Comment: (NOTE) SARS-CoV-2 target nucleic acids are NOT DETECTED.  The SARS-CoV-2 RNA is generally detectable in upper respiratory specimens during the acute phase of infection. The lowest concentration of SARS-CoV-2 viral copies this assay can detect is 138 copies/mL. A negative result does not preclude SARS-Cov-2 infection and should not be used as the sole basis for treatment or other patient management decisions. A negative result may occur with  improper specimen collection/handling, submission of specimen other than nasopharyngeal swab, presence of viral mutation(s) within the areas targeted by this assay, and inadequate number of viral copies(<138 copies/mL). A negative result must be combined with clinical observations, patient history, and epidemiological information. The expected result is Negative.  Fact Sheet for Patients:  EntrepreneurPulse.com.au  Fact Sheet for Healthcare Providers:  IncredibleEmployment.be  This test is no t yet approved or cleared by the [place] FDA and  has been authorized for detection and/or diagnosis of SARS-CoV-2 by FDA under an Emergency Use Authorization (EUA). This EUA will remain  in effect (meaning this test can be used) for the duration of the COVID-19 declaration under Section 564(b)(1) of the Act, 21 U.S.C.section 360bbb-3(b)(1), unless the authorization is terminated  or revoked sooner.       Influenza A by PCR NEGATIVE NEGATIVE   Influenza B by PCR NEGATIVE NEGATIVE    Comment: (NOTE) The Xpert Xpress SARS-CoV-2/FLU/RSV plus assay is intended as an aid in the diagnosis of influenza from Nasopharyngeal swab specimens and should not be used as a sole basis for treatment. Nasal washings and aspirates are unacceptable for Xpert Xpress SARS-CoV-2/FLU/RSV testing.  Fact Sheet for Patients: EntrepreneurPulse.com.au  Fact Sheet for Healthcare  Providers: IncredibleEmployment.be  This test is not yet approved or cleared by the [place] FDA and has been authorized for detection and/or diagnosis of SARS-CoV-2 by FDA under an Emergency Use Authorization (EUA). This EUA will remain in effect (meaning this test can be used) for the duration of the COVID-19 declaration under Section 564(b)(1) of the Act, 21 U.S.C. section 360bbb-3(b)(1), unless the authorization is terminated or revoked.  Performed at [facility], [place] [address], [place],  [address]   Type and screen [place]     Status: None   Collection Time: 04/19/21  8:00 AM  Result Value Ref Range   ABO/RH(D) A  POS    Antibody Screen NEG    Sample Expiration      04/22/2021,2359 Performed at [facility], [place] [address] [place], [place] [address]     CT HEAD WO CONTRAST (5MM)  Result Date: 04/19/2021 CLINICAL DATA:  Head trauma, minor (Age >= 65y) Head trauma, moderate-severe EXAM: CT HEAD WITHOUT CONTRAST TECHNIQUE: Contiguous axial images were obtained from the base of the skull through the vertex without intravenous contrast. RADIATION DOSE REDUCTION: This exam was performed according to the departmental dose-optimization program which includes automated exposure control, adjustment of the mA and/or kV according to patient size and/or use of iterative reconstruction technique. COMPARISON:  02/11/2021 FINDINGS: Brain: There is atrophy and chronic small vessel disease changes. No acute intracranial abnormality. Specifically, no hemorrhage, hydrocephalus, mass lesion, acute infarction, or significant intracranial injury. Vascular: No hyperdense vessel or unexpected calcification. Skull: No acute calvarial abnormality. Sinuses/Orbits: Air-fluid level in the left maxillary sinus. Fracture through the floor of the left orbit seen on coronal imaging. Mildly depressed fracture fragments. No evidence of entrapment. Other:  Soft tissue swelling over the left orbit. IMPRESSION: Atrophy, chronic microvascular disease. No acute intracranial abnormality. Fracture through the floor the left orbit. Mildly depressed fracture fragments without evidence of entrapment. Overlying soft tissue swelling. Electronically Signed   By: Rolm Baptise M.D.   On: 04/19/2021 03:09   CT Cervical Spine Wo Contrast  Result Date: 04/19/2021 CLINICAL DATA:  Neck trauma (Age >= 65y) EXAM: CT CERVICAL SPINE WITHOUT CONTRAST TECHNIQUE: Multidetector CT imaging of the cervical spine was performed without intravenous contrast. Multiplanar CT image reconstructions were also generated. RADIATION DOSE REDUCTION: This exam was performed according to the departmental dose-optimization program which includes automated exposure control, adjustment of the mA and/or kV according to patient size and/or use of iterative reconstruction technique. COMPARISON:  02/11/2021 FINDINGS: Alignment: Normal Skull base and vertebrae: No acute fracture. No primary bone lesion or focal pathologic process. Soft tissues and spinal canal: No prevertebral fluid or swelling. No visible canal hematoma. Disc levels:  Diffuse degenerative disc disease and facet disease. Upper chest: No acute findings Other: Left orbital floor fracture again noted as seen on head CT with blood layering in the left maxillary sinus. IMPRESSION: No acute bony abnormality in the cervical spine. Left orbital floor fracture with layering blood in the left maxillary sinus. Electronically Signed   By: Rolm Baptise M.D.   On: 04/19/2021 03:11   DG Pelvis Portable  Result Date: 04/19/2021 CLINICAL DATA:  Fall with EXAM: PORTABLE PELVIS 1-2 VIEWS COMPARISON:  Right hip radiograph dated 02/11/2021 and pelvic radiograph dated 03/22/2020. FINDINGS: Evaluation is very limited due to positioning. Prior fixation screw of the right femoral neck fracture. The screws appear intact as visualized. There is foreshortened appearance  of the left femoral neck with an area of angulation. Although this may be positional, a femoral neck fracture is not excluded. Further evaluation with CT is recommended. The bones are osteopenic. No dislocation. The soft tissues are unremarkable. IMPRESSION: Findings concerning for fracture of the left femoral neck. Further evaluation with CT recommended. Electronically Signed   By: Anner Crete M.D.   On: 04/19/2021 02:54   CT Hip Left Wo Contrast  Result Date: 04/19/2021 CLINICAL DATA:  Hip trauma with fracture suspected. X-ray completed. EXAM: CT OF THE LEFT HIP WITHOUT CONTRAST TECHNIQUE: Multidetector CT imaging of the left hip was performed according to the standard protocol. Multiplanar CT image reconstructions were also generated. RADIATION DOSE REDUCTION: This  exam was performed according to the departmental dose-optimization program which includes automated exposure control, adjustment of the mA and/or kV according to patient size and/or use of iterative reconstruction technique. COMPARISON:  Preceding radiography FINDINGS: Bones/Joint/Cartilage Subcapital left femoral neck fracture with posterior impaction. Mild for age degenerative spurring at the hip joint. No visible pelvic ring fracture. Ligaments Suboptimally assessed by CT. Muscles and Tendons No evidence of major muscular ligamentous disruption. Soft tissues Expected swelling around the injury. The rectum and distal sigmoid are distended by stool. IMPRESSION: Subcapital left femoral neck fracture with posterolateral impaction. Electronically Signed   By: Jorje Guild M.D.   On: 04/19/2021 05:56   DG Chest Portable 1 View  Result Date: 04/19/2021 CLINICAL DATA:  Fall. EXAM: PORTABLE CHEST 1 VIEW COMPARISON:  Chest radiograph dated 02/11/2021. FINDINGS: No focal consolidation, pleural effusion or pneumothorax. Mild cardiomegaly. No acute osseous pathology. IMPRESSION: 1. No acute cardiopulmonary process. 2. Mild cardiomegaly.  Electronically Signed   By: Anner Crete M.D.   On: 04/19/2021 02:52    Review of Systems  Unable to perform ROS: Dementia  Blood pressure 126/80, pulse 77, temperature (!) 96.8 F (36 C), temperature source Temporal, resp. rate 18, height 5\' 5"  (1.651 m), weight 55 kg, SpO2 94 %. Physical Exam Constitutional:      General: She is not in acute distress.    Appearance: She is well-developed. She is not diaphoretic.  HENT:     Head: Normocephalic and atraumatic.  Eyes:     General: No scleral icterus.       Right eye: No discharge.        Left eye: No discharge.     Conjunctiva/sclera: Conjunctivae normal.  Neck:     Comments: C-collar Cardiovascular:     Rate and Rhythm: Normal rate and regular rhythm.  Pulmonary:     Effort: Pulmonary effort is normal. No respiratory distress.  Musculoskeletal:     Comments: LLE No traumatic wounds, ecchymosis, or rash  Mod TTP hip  No knee or ankle effusion  Knee stable to varus/ valgus and anterior/posterior stress  Sens DPN, SPN, TN could not assess  Motor EHL, ext, flex, evers could not assess  DP 2+, PT 0, No significant edema  Skin:    General: Skin is warm and dry.  Neurological:     Mental Status: She is alert.  Psychiatric:        Mood and Affect: Mood is anxious.        Behavior: Behavior is agitated.        Cognition and Memory: Cognition is impaired.    Assessment/Plan: Left hip fx -- Plan cannulated hip pinning Thursday by Dr. Percell Miller. Please keep NPO.    Lisette Abu, PA-C Orthopedic Surgery [phone] 04/19/2021, 9:40 AM

## 2021-04-19 NOTE — Anesthesia Procedure Notes (Signed)
Anesthesia Regional Block: Femoral nerve block   Pre-Anesthetic Checklist: , timeout performed,  Correct Patient, Correct Site, Correct Laterality,  Correct Procedure, Correct Position, site marked,  Risks and benefits discussed,  Surgical consent,  Pre-op evaluation,  At surgeon's request and post-op pain management  Laterality: Left  Prep: chloraprep       Needles:  Injection technique: Single-shot  Needle Type: Echogenic Stimulator Needle     Needle Length: 9cm  Needle Gauge: 21     Additional Needles:   Procedures:, nerve stimulator,,, ultrasound used (permanent image in chart),,     Nerve Stimulator or Paresthesia:  Response: quad, 0.5 mA  Additional Responses:   Narrative:  Start time: 04/19/2021 9:40 AM End time: 04/19/2021 9:48 AM Injection made incrementally with aspirations every 5 mL.  Performed by: Personally  Anesthesiologist: Marcene Duos, MD

## 2021-04-19 NOTE — H&P (Addendum)
History and Physical    Rita Torres YQI:[id number] DOB: 1932-12-07 DOA: 04/19/2021  Referring MD/NP/PA: Midge Minium, MD PCP: Daisy Floro, MD  Patient coming from: [facility] via EMS  Chief Complaint: Fall  I have personally briefly reviewed patient's old medical records in [facility]   HPI: Rita Torres is a 88 y.o. female with medical history significant of dementia with behavioral disturbance, vitamin D deficiency, and recent subcapital right femoral neck fracture s/p cannulated hip pinning on 02/12/2021 by Dr. Eulah Pont who presents after having unwitnessed fall.  History is limited due to the patient's history of dementia.  Found by staff lying on her left side with facial trauma.  She is on blood thinners of Eliquis with last dose taken yesterday at 4 PM.  Patient thinks that she is currently at home at this time and is unable to provide any additional history.    Her son is currently present at bedside and notes that the patient  has pretty advanced dementia, but normally can feed herself.  She previously had no broken bones prior to the fall with right hip fracture in November of last year.   Her son recalls that after the surgery in November she had been pretty agitated.  At the memory care unit where she was standing she had been getting around with use of wheelchair following the surgery.  She had been placed on Eliquis following the procedure.  ED Course: Upon admission into the emergency department patient was seen to have temperature of 97.1 F, blood pressures 95/52-150/63, and all other vital signs maintained.  Labs significant for hemoglobin 10.3, MCV 102.9, platelets 89, BUN 35, and creatinine 0.76.  Chest x-ray noted mild cardiomegaly without signs of effusion or consolidation.    CT scan of the head and cervical spine noted a fracture to the floor the left orbit with mildly depressed fracture fragments without evidence of entrapment and surrounding soft  tissue swelling without any other intracranial or cervical fractures.  CT scan of the pelvis noted a subcapital left femoral neck fracture with posterior lateral impaction.Orthopedics and ENT had been consulted.  Patient had received a Tdap booster.  [facility] called to admit.  Review of Systems  Unable to perform ROS: Dementia   Past Medical History:  Diagnosis Date   Dementia (HCC)    Dyspnea on exertion    Grief reaction    Memory loss    Osteopenia 04/2015   t score -1.8    Past Surgical History:  Procedure Laterality Date   ABDOMINAL HYSTERECTOMY     BSO   HIP PINNING,CANNULATED Right 02/12/2021   Procedure: CANNULATED HIP PINNING;  Surgeon: Sheral Apley, MD;  Location: [facility];  Service: Orthopedics;  Laterality: Right;   OOPHORECTOMY     BSO     reports that she has never smoked. She has never used smokeless tobacco. She reports that she does not drink alcohol and does not use drugs.  Allergies  Allergen Reactions   Penicillins Rash    Did it involve swelling of the face/tongue/throat, SOB, or low BP? N Did it involve sudden or severe rash/hives, skin peeling, or any reaction on the inside of your mouth or nose? N Did you need to seek medical attention at a hospital or doctor's office? N When did it last happen?       If all above answers are NO, may proceed with cephalosporin use.     Family History  Problem Relation  Age of Onset   Heart disease Father     Prior to Admission medications   Medication Sig Start Date End Date Taking? Authorizing Provider  acetaminophen (TYLENOL) 325 MG tablet Take 650 mg by mouth in the morning and at bedtime.   Yes [provider]  apixaban (ELIQUIS) 5 MG TABS tablet Take 5 mg by mouth 2 (two) times daily.   Yes [provider]  cholecalciferol (VITAMIN D) 25 MCG tablet Take 1 tablet (1,000 Units total) by mouth daily. 02/17/21  Yes [place], Eric J, DO  citalopram (CELEXA) 10 MG tablet Take 1 tablet (10 mg total)  by mouth daily. 02/26/20  Yes Van Clines, MD  divalproex (DEPAKOTE ER) 250 MG 24 hr tablet TAKE 1 TABLET BY MOUTH EVERY DAY AT NIGHT Patient taking differently: Take 250 mg by mouth at bedtime. 02/26/20  Yes Van Clines, MD  docusate sodium (COLACE) 100 MG capsule Take 1 capsule (100 mg total) by mouth 2 (two) times daily. 02/14/21  Yes Kathlen Mody, MD  donepezil (ARICEPT) 10 MG tablet Take 1 tablet every night Patient taking differently: Take 10 mg by mouth daily. Take 1 tablet every night 02/26/20  Yes Van Clines, MD  furosemide (LASIX) 20 MG tablet Take 10 mg by mouth daily.   Yes [provider]  LORazepam (ATIVAN) 0.5 MG tablet Take 0.25 mg by mouth 2 (two) times daily.   Yes [provider]  memantine (NAMENDA) 10 MG tablet 1 TABLET TWICE A DAY Patient taking differently: Take 10 mg by mouth 2 (two) times daily. 02/26/20  Yes Van Clines, MD  Multiple Vitamin (MULTIVITAMIN WITH MINERALS) TABS tablet Take 1 tablet by mouth daily. 02/15/21  Yes Kathlen Mody, MD  Nutritional Supplements (ENSURE ORIGINAL PO) Take 237 mLs by mouth in the morning and at bedtime. vanilla   Yes [provider]  senna (SENOKOT) 8.6 MG TABS tablet Take 1 tablet (8.6 mg total) by mouth 2 (two) times daily. 02/14/21  Yes Kathlen Mody, MD  acetaminophen (TYLENOL) 325 MG tablet Take 650 mg by mouth 2 (two) times daily as needed for moderate pain or mild pain. Use caution with APAP, total daily dose greater than 3000 MG Patient not taking: Reported on 04/19/2021    [provider]  acetaminophen (TYLENOL) 500 MG tablet Take 1 tablet (500 mg total) by mouth every 6 (six) hours as needed for mild pain or moderate pain. Patient not taking: Reported on 04/19/2021 02/15/21   Jenne Pane, PA-C  feeding supplement (ENSURE SURGERY) LIQD Take 237 mLs by mouth 2 (two) times daily between meals. Patient not taking: Reported on 04/19/2021 02/14/21   Kathlen Mody, MD  loperamide  (IMODIUM) 2 MG capsule Take 2-4 mg by mouth See admin instructions. Take 2 tablets by mouth after initial loose stool, then 1 tablet after each additional. Do not exceed 4 tablets in 24 hours. Patient not taking: Reported on 04/19/2021    [provider]  polyethylene glycol (MIRALAX / GLYCOLAX) 17 g packet Take 17 g by mouth daily as needed for mild constipation. Patient not taking: Reported on 04/19/2021 02/14/21   Kathlen Mody, MD  rivaroxaban (XARELTO) 10 MG TABS tablet Take 1 tablet (10 mg total) by mouth daily. For DVT prophylaxis after surgery. Patient not taking: Reported on 04/19/2021 02/15/21   Jenne Pane, PA-C    Physical Exam:  Constitutional: Elderly female currently in no acute distress Vitals:   04/19/21 0430 04/19/21 0500 04/19/21  0545 04/19/21 0600  BP: (!) 95/52 (!) 129/49 (!) 150/63 (!) 105/55  Pulse: 64 68 79 64  Resp: 11 13 18 11   Temp:      TempSrc:      SpO2: 98% 96% 94% 97%  Weight:      Height:       Eyes: Swelling and bruising of the left eye with laceration of the left brow with approximately 6 stitches in place ENMT: Mucous membranes are moist. Posterior pharynx clear of any exudate or lesions.  Neck: normal, supple Respiratory: clear to auscultation bilaterally, no wheezing, no crackles. Normal respiratory effort with O2 saturation maintained on room air. Cardiovascular: Regular rate and rhythm, no murmurs / rubs / gallops.  Trace lower extremity edema. Abdomen: no tenderness, no masses palpated. No Bowel sounds positive.  Musculoskeletal: no clubbing / cyanosis.  Tenderness palpation of the left hip Skin: Bruising of the left thigh as noted above Neurologic: CN 2-12 grossly intact.  Appears able to move all extremities. Psychiatric: Alert and oriented only to self.  Patient is demented and thinks that she is currently at home at this time.  Agitated mood.     Labs on Admission: I have personally reviewed following labs and imaging  studies  CBC: Recent Labs  Lab 04/19/21 0241  WBC 7.4  NEUTROABS 6.4  HGB 10.3*  HCT 35.5*  MCV 102.9*  PLT 89*   Basic Metabolic Panel: Recent Labs  Lab 04/19/21 0241  NA 140  K 4.2  CL 106  CO2 26  GLUCOSE 123*  BUN 35*  CREATININE 0.76  CALCIUM 8.9   GFR: Estimated Creatinine Clearance: 43.8 mL/min (by C-G formula based on SCr of 0.76 mg/dL). Liver Function Tests: No results for input(s): AST, ALT, ALKPHOS, BILITOT, PROT, ALBUMIN in the last 168 hours. No results for input(s): LIPASE, AMYLASE in the last 168 hours. No results for input(s): AMMONIA in the last 168 hours. Coagulation Profile: No results for input(s): INR, PROTIME in the last 168 hours. Cardiac Enzymes: No results for input(s): CKTOTAL, CKMB, CKMBINDEX, TROPONINI in the last 168 hours. BNP (last 3 results) No results for input(s): PROBNP in the last 8760 hours. HbA1C: No results for input(s): HGBA1C in the last 72 hours. CBG: No results for input(s): GLUCAP in the last 168 hours. Lipid Profile: No results for input(s): CHOL, HDL, LDLCALC, TRIG, CHOLHDL, LDLDIRECT in the last 72 hours. Thyroid Function Tests: No results for input(s): TSH, T4TOTAL, FREET4, T3FREE, THYROIDAB in the last 72 hours. Anemia Panel: No results for input(s): VITAMINB12, FOLATE, FERRITIN, TIBC, IRON, RETICCTPCT in the last 72 hours. Urine analysis:    Component Value Date/Time   COLORURINE YELLOW 02/12/2021 0908   APPEARANCEUR HAZY (A) 02/12/2021 0908   LABSPEC 1.018 02/12/2021 0908   PHURINE 7.0 02/12/2021 0908   GLUCOSEU NEGATIVE 02/12/2021 0908   HGBUR NEGATIVE 02/12/2021 0908   BILIRUBINUR NEGATIVE 02/12/2021 0908   KETONESUR NEGATIVE 02/12/2021 0908   PROTEINUR NEGATIVE 02/12/2021 0908   UROBILINOGEN 1 12/29/2013 1433   NITRITE NEGATIVE 02/12/2021 0908   LEUKOCYTESUR NEGATIVE 02/12/2021 0908   Sepsis Labs: Recent Results (from the past 240 hour(s))  Resp Panel by RT-PCR (Flu A&B, Covid) Nasopharyngeal Swab      Status: None   Collection Time: 04/19/21  6:08 AM   Specimen: Nasopharyngeal Swab; Nasopharyngeal(NP) swabs in vial transport medium  Result Value Ref Range Status   SARS Coronavirus 2 by RT PCR NEGATIVE NEGATIVE Final    Comment: (NOTE) SARS-CoV-2 target nucleic  acids are NOT DETECTED.  The SARS-CoV-2 RNA is generally detectable in upper respiratory specimens during the acute phase of infection. The lowest concentration of SARS-CoV-2 viral copies this assay can detect is 138 copies/mL. A negative result does not preclude SARS-Cov-2 infection and should not be used as the sole basis for treatment or other patient management decisions. A negative result may occur with  improper specimen collection/handling, submission of specimen other than nasopharyngeal swab, presence of viral mutation(s) within the areas targeted by this assay, and inadequate number of viral copies(<138 copies/mL). A negative result must be combined with clinical observations, patient history, and epidemiological information. The expected result is Negative.  Fact Sheet for Patients:  BloggerCourse.com  Fact Sheet for Healthcare Providers:  SeriousBroker.it  This test is no t yet approved or cleared by the [place] FDA and  has been authorized for detection and/or diagnosis of SARS-CoV-2 by FDA under an Emergency Use Authorization (EUA). This EUA will remain  in effect (meaning this test can be used) for the duration of the COVID-19 declaration under Section 564(b)(1) of the Act, 21 U.S.C.section 360bbb-3(b)(1), unless the authorization is terminated  or revoked sooner.       Influenza A by PCR NEGATIVE NEGATIVE Final   Influenza B by PCR NEGATIVE NEGATIVE Final    Comment: (NOTE) The Xpert Xpress SARS-CoV-2/FLU/RSV plus assay is intended as an aid in the diagnosis of influenza from Nasopharyngeal swab specimens and should not be used as a sole basis  for treatment. Nasal washings and aspirates are unacceptable for Xpert Xpress SARS-CoV-2/FLU/RSV testing.  Fact Sheet for Patients: BloggerCourse.com  Fact Sheet for Healthcare Providers: SeriousBroker.it  This test is not yet approved or cleared by the [place] FDA and has been authorized for detection and/or diagnosis of SARS-CoV-2 by FDA under an Emergency Use Authorization (EUA). This EUA will remain in effect (meaning this test can be used) for the duration of the COVID-19 declaration under Section 564(b)(1) of the Act, 21 U.S.C. section 360bbb-3(b)(1), unless the authorization is terminated or revoked.  Performed at [facility], [address], [place], [place] [address]      Radiological Exams on Admission: CT HEAD WO CONTRAST ( )  Result Date: 04/19/2021 CLINICAL DATA:  Head trauma, minor (Age >= 65y) Head trauma, moderate-severe EXAM: CT HEAD WITHOUT CONTRAST TECHNIQUE: Contiguous axial images were obtained from the base of the skull through the vertex without intravenous contrast. RADIATION DOSE REDUCTION: This exam was performed according to the departmental dose-optimization program which includes automated exposure control, adjustment of the mA and/or kV according to patient size and/or use of iterative reconstruction technique. COMPARISON:  02/11/2021 FINDINGS: Brain: There is atrophy and chronic small vessel disease changes. No acute intracranial abnormality. Specifically, no hemorrhage, hydrocephalus, mass lesion, acute infarction, or significant intracranial injury. Vascular: No hyperdense vessel or unexpected calcification. Skull: No acute calvarial abnormality. Sinuses/Orbits: Air-fluid level in the left maxillary sinus. Fracture through the floor of the left orbit seen on coronal imaging. Mildly depressed fracture fragments. No evidence of entrapment. Other: Soft tissue swelling over the left orbit.  IMPRESSION: Atrophy, chronic microvascular disease. No acute intracranial abnormality. Fracture through the floor the left orbit. Mildly depressed fracture fragments without evidence of entrapment. Overlying soft tissue swelling. Electronically Signed   By: Charlett Nose M.D.   On: 04/19/2021 03:09   CT Cervical Spine Wo Contrast  Result Date: 04/19/2021 CLINICAL DATA:  Neck trauma (Age >= 65y) EXAM: CT CERVICAL SPINE WITHOUT CONTRAST TECHNIQUE: Multidetector  CT imaging of the cervical spine was performed without intravenous contrast. Multiplanar CT image reconstructions were also generated. RADIATION DOSE REDUCTION: This exam was performed according to the departmental dose-optimization program which includes automated exposure control, adjustment of the mA and/or kV according to patient size and/or use of iterative reconstruction technique. COMPARISON:  02/11/2021 FINDINGS: Alignment: Normal Skull base and vertebrae: No acute fracture. No primary bone lesion or focal pathologic process. Soft tissues and spinal canal: No prevertebral fluid or swelling. No visible canal hematoma. Disc levels:  Diffuse degenerative disc disease and facet disease. Upper chest: No acute findings Other: Left orbital floor fracture again noted as seen on head CT with blood layering in the left maxillary sinus. IMPRESSION: No acute bony abnormality in the cervical spine. Left orbital floor fracture with layering blood in the left maxillary sinus. Electronically Signed   By: Charlett NoseKevin  Dover M.D.   On: 04/19/2021 03:11   DG Pelvis Portable  Result Date: 04/19/2021 CLINICAL DATA:  Fall with EXAM: PORTABLE PELVIS 1-2 VIEWS COMPARISON:  Right hip radiograph dated 02/11/2021 and pelvic radiograph dated 03/22/2020. FINDINGS: Evaluation is very limited due to positioning. Prior fixation screw of the right femoral neck fracture. The screws appear intact as visualized. There is foreshortened appearance of the left femoral neck with an area of  angulation. Although this may be positional, a femoral neck fracture is not excluded. Further evaluation with CT is recommended. The bones are osteopenic. No dislocation. The soft tissues are unremarkable. IMPRESSION: Findings concerning for fracture of the left femoral neck. Further evaluation with CT recommended. Electronically Signed   By: Elgie CollardArash  Radparvar M.D.   On: 04/19/2021 02:54   CT Hip Left Wo Contrast  Result Date: 04/19/2021 CLINICAL DATA:  Hip trauma with fracture suspected. X-ray completed. EXAM: CT OF THE LEFT HIP WITHOUT CONTRAST TECHNIQUE: Multidetector CT imaging of the left hip was performed according to the standard protocol. Multiplanar CT image reconstructions were also generated. RADIATION DOSE REDUCTION: This exam was performed according to the departmental dose-optimization program which includes automated exposure control, adjustment of the mA and/or kV according to patient size and/or use of iterative reconstruction technique. COMPARISON:  Preceding radiography FINDINGS: Bones/Joint/Cartilage Subcapital left femoral neck fracture with posterior impaction. Mild for age degenerative spurring at the hip joint. No visible pelvic ring fracture. Ligaments Suboptimally assessed by CT. Muscles and Tendons No evidence of major muscular ligamentous disruption. Soft tissues Expected swelling around the injury. The rectum and distal sigmoid are distended by stool. IMPRESSION: Subcapital left femoral neck fracture with posterolateral impaction. Electronically Signed   By: Tiburcio PeaJonathan  Watts M.D.   On: 04/19/2021 05:56   DG Chest Portable 1 View  Result Date: 04/19/2021 CLINICAL DATA:  Fall. EXAM: PORTABLE CHEST 1 VIEW COMPARISON:  Chest radiograph dated 02/11/2021. FINDINGS: No focal consolidation, pleural effusion or pneumothorax. Mild cardiomegaly. No acute osseous pathology. IMPRESSION: 1. No acute cardiopulmonary process. 2. Mild cardiomegaly. Electronically Signed   By: Elgie CollardArash  Radparvar M.D.    On: 04/19/2021 02:52    EKG: Independently reviewed.  79 bpm with significant background artifact possibly in atrial fibrillation  Assessment/Plan  Closed left subcapital femoral neck fracture secondary to fall: Acute.  Patient had unwitnessed fall at the facility.  Found to have subcapital left femoral neck fracture with posterior lateral traction on CT scan.  Orthopedics Dr. Susa SimmondsAdair was consulted with request for [facility] as they had fixed her right hip 2 months ago.  Orthopedics was formally consulted and plan on surgery tomorrow  morning.  She had received a Tdap booster in the ED. -Admit to a medical telemetry bed -Hip fracture order set utilized -N.p.o. after midnight -Tramadol/morphine IV as needed for severe pain -Transitions of care consulted for need of placement -Appreciate orthopedics consultative services, will follow-up for further recommendations  Closed left orbital floor fracture secondary to fall: Patient was noted to have a left orbital floor fracture with layering blood in the left maxillary sinus.  Dr. Elijah Birkaldwell of ENT was consulted and recommended 1 week follow-up in outpatient clinic. -Will need outpatient follow-up in 1 week with Dr. Ledell Peoplesaldwell's office  Dementia with behavioral disturbance: Patient felt unsafe to swallow as not readily following commands by nursing.  Home medication regimen includes Depakote 250 mg nightly, Aricept 10 mg daily, and Namenda 10 mg twice daily. -Delirium precaution -Set bed alarm on -Continue home regimen once able -Ativan IV as needed for anxiety/agitation -Speech therapy to evaluate -May warrant palliative care consult  Dehydration: Acute.  On admission BUN 35 with creatinine 0.76.  The BUN to creatinine ratio greater than 20 to suggest dehydration and fact patient had been on diuretics and blood pressures were soft. -Hold furosemide due to low blood pressures -Normal saline IV fluids at 50 mL/h x 1 day  Macrocytic anemia:  Chronic.  Hemoglobin 10.3 g/dL which appears near baseline of 9 to 10 g/dL.  Vitamin B12 noted to be 247 02/14/2021. -Continue to monitor  Thrombocytopenia: Acute on chronic.  Platelet count 89 which is lower than previous baseline range which appears to be 110-140s. -Continue to monitor  Anxiety and depression: Home regimen includes Celexa 10 mg daily and Ativan 0.25 mg twice. -Continue current regimen once able to tolerate p.o.  History of right fracture: Just underwent pinning for right hip fracture by Dr. Eulah PontMurphy in 02/2021.  Son states that she had been placed on Eliquis following the procedure. -Hold Eliquis and consider resuming postprocedure   DVT prophylaxis: SCDs Code Status: DNR Family Communication: Son updated at bedside Disposition Plan: Likely discharge back to SNF Consults called: Orthopedics Admission status: Inpatient, require more than 2 midnight stay for procedure  Clydie Braunondell A Derica Leiber MD [facility]   If 7PM-7AM, please contact night-coverage   04/19/2021, 7:17 AM

## 2021-04-19 NOTE — Plan of Care (Signed)
  Problem: Education: Goal: Knowledge of General Education information will improve Description: Including pain rating scale, medication(s)/side effects and non-pharmacologic comfort measures Outcome: Not Progressing   Problem: Health Behavior/Discharge Planning: Goal: Ability to manage health-related needs will improve Outcome: Not Progressing   

## 2021-04-19 NOTE — TOC CAGE-AID Note (Signed)
Transition of Care [facility]) - CAGE-AID Screening   Patient Details  Name: Rita Torres MRN: [id number] Date of Birth: 1932/10/11  [facility]) CM/SW Contact:    Katha Hamming, RN Phone Number:[phone] 04/19/2021, 3:43 AM   Clinical Narrative:  Patient arrives to hospital after a fall at her SNF, found down on her left side with unknown downtime. Hx dementia, unable to participate in screening.  CAGE-AID Screening: Substance Abuse Screening unable to be completed due to: : Patient unable to participate (baseline dementia, unable to answer questions)

## 2021-04-19 NOTE — Progress Notes (Signed)
Orthopedic Tech Progress Note Patient Details:  Rita Torres 19-Jan-1933 [id number]  Patient ID: Rita Torres, female   DOB: May 24, 1932, 88 y.o.   MRN: [id number] No OHF; pt over 70.  Rita Torres 04/19/2021, 7:44 AM

## 2021-04-19 NOTE — H&P (Signed)
PREOPERATIVE H&P  Chief Complaint: Left hip fracture  HPI: Rita Torres is a 88 y.o. female who presents with a diagnosis of Left hip fracture. She was found down in the ground at her SNF. Facial trauma also present. She has dementia and is unable to contribute to her history. Symptoms are rated as moderate to severe, and have been worsening.  This is significantly impairing activities of daily living.  She and her POA have elected for surgical management.   Past Medical History:  Diagnosis Date   Dementia (HCC)    Dyspnea on exertion    Grief reaction    Memory loss    Osteopenia 04/2015   t score -1.8   Past Surgical History:  Procedure Laterality Date   ABDOMINAL HYSTERECTOMY     BSO   HIP PINNING,CANNULATED Right 02/12/2021   Procedure: CANNULATED HIP PINNING;  Surgeon: Sheral Apley, MD;  Location: [facility];  Service: Orthopedics;  Laterality: Right;   OOPHORECTOMY     BSO   Social History   Socioeconomic History   Marital status: Widowed    Spouse name: Not on file   Number of children: Not on file   Years of education: Not on file   Highest education level: Not on file  Occupational History   Not on file  Tobacco Use   Smoking status: Never   Smokeless tobacco: Never  Vaping Use   Vaping Use: Never used  Substance and Sexual Activity   Alcohol use: No    Alcohol/week: 0.0 standard drinks   Drug use: No   Sexual activity: Never    Birth control/protection: Surgical    Comment: HYST-1st intercourse 36 yo-1 partner  Other Topics Concern   Not on file  Social History Narrative   Right handed   College   Widowed   Home Health at pts home 24/7   Social Determinants of Health   Financial Resource Strain: Not on file  Food Insecurity: Not on file  Transportation Needs: Not on file  Physical Activity: Not on file  Stress: Not on file  Social Connections: Not on file   Family History  Problem Relation Age of Onset   Heart disease Father     Allergies  Allergen Reactions   Penicillins Rash    Tolerated Ancef. Did it involve swelling of the face/tongue/throat, SOB, or low BP? N Did it involve sudden or severe rash/hives, skin peeling, or any reaction on the inside of your mouth or nose? N Did you need to seek medical attention at a hospital or doctor's office? N When did it last happen?       If all above answers are "NO", may proceed with cephalosporin use.    Prior to Admission medications   Medication Sig Start Date End Date Taking? Authorizing Provider  acetaminophen (TYLENOL) 325 MG tablet Take 650 mg by mouth in the morning and at bedtime.   Yes [provider]  apixaban (ELIQUIS) 5 MG TABS tablet Take 5 mg by mouth 2 (two) times daily.   Yes [provider]  cholecalciferol (VITAMIN D) 25 MCG tablet Take 1 tablet (1,000 Units total) by mouth daily. 02/17/21  Yes [place], Eric J, DO  citalopram (CELEXA) 10 MG tablet Take 1 tablet (10 mg total) by mouth daily. 02/26/20  Yes Van Clines, MD  divalproex (DEPAKOTE ER) 250 MG 24 hr tablet TAKE 1 TABLET BY MOUTH EVERY DAY AT NIGHT Patient taking differently: Take 250 mg by mouth  at bedtime. 02/26/20  Yes Van Clines, MD  docusate sodium (COLACE) 100 MG capsule Take 1 capsule (100 mg total) by mouth 2 (two) times daily. 02/14/21  Yes Kathlen Mody, MD  donepezil (ARICEPT) 10 MG tablet Take 1 tablet every night Patient taking differently: Take 10 mg by mouth daily. Take 1 tablet every night 02/26/20  Yes Van Clines, MD  furosemide (LASIX) 20 MG tablet Take 10 mg by mouth daily.   Yes [provider]  LORazepam (ATIVAN) 0.5 MG tablet Take 0.25 mg by mouth 2 (two) times daily.   Yes [provider]  memantine (NAMENDA) 10 MG tablet 1 TABLET TWICE A DAY Patient taking differently: Take 10 mg by mouth 2 (two) times daily. 02/26/20  Yes Van Clines, MD  Multiple Vitamin (MULTIVITAMIN WITH MINERALS) TABS tablet Take 1 tablet by  mouth daily. 02/15/21  Yes Kathlen Mody, MD  Nutritional Supplements (ENSURE ORIGINAL PO) Take 237 mLs by mouth in the morning and at bedtime. vanilla   Yes [provider]  senna (SENOKOT) 8.6 MG TABS tablet Take 1 tablet (8.6 mg total) by mouth 2 (two) times daily. 02/14/21  Yes Kathlen Mody, MD  feeding supplement (ENSURE SURGERY) LIQD Take 237 mLs by mouth 2 (two) times daily between meals. Patient not taking: Reported on 04/19/2021 02/14/21   Kathlen Mody, MD  loperamide (IMODIUM) 2 MG capsule Take 2-4 mg by mouth See admin instructions. Take 2 tablets by mouth after initial loose stool, then 1 tablet after each additional. Do not exceed 4 tablets in 24 hours. Patient not taking: Reported on 04/19/2021    [provider]  polyethylene glycol (MIRALAX / GLYCOLAX) 17 g packet Take 17 g by mouth daily as needed for mild constipation. Patient not taking: Reported on 04/19/2021 02/14/21   Kathlen Mody, MD     Positive ROS: All other systems have been reviewed and were otherwise negative with the exception of those mentioned in the HPI and as above.  Physical Exam: General: Alert, no acute distress. Facial lacerations and edema to left side Cardiovascular: No pedal edema Respiratory: No cyanosis, no use of accessory musculature GI: No organomegaly, abdomen is soft and non-tender Skin: No lesions in the area of chief complaint Neurologic: Sensation intact distally Psychiatric: Patient is competent for consent with normal mood and affect Lymphatic: No axillary or cervical lymphadenopathy  MUSCULOSKELETAL: exam limited by patient's dementia and joint contractures, appears to be in the fetal position, left hip TTP, hip ROM limited, NVI   Imaging: CT left hip shows Subcapital left femoral neck fracture with posterolateral impaction   Assessment: Left hip fracture  Plan: Plan for Procedure(s): CANNULATED HIP PINNING  The risks benefits and alternatives were discussed with  the patient including but not limited to the risks of nonoperative treatment, versus surgical intervention including infection, bleeding, nerve injury,  blood clots, cardiopulmonary complications, morbidity, mortality, among others, and they were willing to proceed.   After surgery Weightbearing: WBAT LLE Orthopedic devices: none Showering: POD 3 Dressing: reinforce as needed Medicines: on Eliquis already so will restart POD 1, for pain Tylenol and Tramadol PRN, try to minimize narcotics due to dementia  Discharge: SNF Follow up: 2 weeks post op    Marzetta Board Office [phone] 04/19/2021 1:56 PM

## 2021-04-19 NOTE — ED Notes (Signed)
Pt transported to PACU 

## 2021-04-19 NOTE — ED Notes (Signed)
Pt will not follow commands to swallow water or safely take medications.

## 2021-04-19 NOTE — Anesthesia Pain Management Evaluation Note (Signed)
°  Anesthesia Pain Consult Note  Patient: Rita Torres, 88 y.o., female  Consult Requested by: Eduard Clos, MD  Reason for Consult: left hip fx  Level of Consciousness: alert, confused  Pain: unable to assess 2/2 confusion   Last Vitals:  Vitals:   04/19/21 0938 04/19/21 0954  BP: 125/70 (!) 167/78  Pulse: 76 85  Resp: 19 15  Temp:    SpO2: 96% 92%    Plan: Peripheral nerve block for pain control  Risks of wet tap, epidural hematoma and spinal cord injury explained to:   Consent:Risks of procedure as well as the alternatives and risks of each were explained to the (patient/caregiver).  Consent for procedure obtained.  Advance Directive:Patient not able to name a surrogate decision maker or provide an advance care plan.   Allergies  Allergen Reactions   Penicillins Rash    Did it involve swelling of the face/tongue/throat, SOB, or low BP? N Did it involve sudden or severe rash/hives, skin peeling, or any reaction on the inside of your mouth or nose? N Did you need to seek medical attention at a hospital or doctor's office? N When did it last happen? If all above answers are NO, may proceed with cephalosporin use.     Physical exam: PULM normal  CARDIO Heart sounds are normal.  Regular rate and rhythm without murmur, gallop or rub.  OTHER    I have reviewed the patient's medications listed below.  cholecalciferol  1,000 Units Oral Daily   citalopram  10 mg Oral Daily   divalproex  250 mg Oral QHS   docusate sodium  100 mg Oral BID   donepezil  10 mg Oral Daily   [START ON 04/20/2021] feeding supplement   Oral BID   fentaNYL       lidocaine-EPINEPHrine       LORazepam  0.25 mg Oral BID   memantine  10 mg Oral BID   senna  1 tablet Oral BID    sodium chloride     traMADol  Past Medical History:  Diagnosis Date   Dementia (HCC)    Dyspnea on exertion    Grief reaction    Memory loss    Osteopenia 04/2015   t score  -1.8   Past Surgical History:  Procedure Laterality Date   ABDOMINAL HYSTERECTOMY     BSO   HIP PINNING,CANNULATED Right 02/12/2021   Procedure: CANNULATED HIP PINNING;  Surgeon: Sheral Apley, MD;  Location: [facility];  Service: Orthopedics;  Laterality: Right;   OOPHORECTOMY     BSO    reports that she has never smoked. She has never used smokeless tobacco. She reports that she does not drink alcohol and does not use drugs.    Kennieth Rad 04/19/2021

## 2021-04-20 ENCOUNTER — Encounter (HOSPITAL_COMMUNITY): Payer: Self-pay | Admitting: Internal Medicine

## 2021-04-20 ENCOUNTER — Inpatient Hospital Stay (HOSPITAL_COMMUNITY): Payer: Medicare Other | Admitting: Anesthesiology

## 2021-04-20 ENCOUNTER — Inpatient Hospital Stay (HOSPITAL_COMMUNITY): Payer: Medicare Other

## 2021-04-20 ENCOUNTER — Encounter (HOSPITAL_COMMUNITY)
Admission: EM | Disposition: A | Discharge status: Other Acute Inpatient Hospital | Payer: Self-pay | Source: Home / Self Care | Attending: Internal Medicine

## 2021-04-20 DIAGNOSIS — S72002D Fracture of unspecified part of neck of left femur, subsequent encounter for closed fracture with routine healing: Secondary | ICD-10-CM

## 2021-04-20 HISTORY — PX: HIP PINNING,CANNULATED: SHX1758

## 2021-04-20 LAB — BASIC METABOLIC PANEL
Anion gap: 7 (ref 5–15)
BUN: 27 mg/dL — ABNORMAL HIGH (ref 8–23)
CO2: 24 mmol/L (ref 22–32)
Calcium: 8.4 mg/dL — ABNORMAL LOW (ref 8.9–10.3)
Chloride: 109 mmol/L (ref 98–111)
Creatinine, Ser: 0.83 mg/dL (ref 0.44–1.00)
GFR, Estimated: >60 mL/min (ref >60)
Glucose, Bld: 94 mg/dL (ref 70–99)
Potassium: 3.8 mmol/L (ref 3.5–5.1)
Sodium: 140 mmol/L (ref 135–145)

## 2021-04-20 LAB — CBC
HCT: 31 % — ABNORMAL LOW (ref 36.0–46.0)
Hemoglobin: 9.8 g/dL — ABNORMAL LOW (ref 12.0–15.0)
MCH: 30.9 pg (ref 26.0–34.0)
MCHC: 31.6 g/dL (ref 30.0–36.0)
MCV: 97.8 fL (ref 80.0–100.0)
Platelets: 81 10*3/uL — ABNORMAL LOW (ref 150–400)
RBC: 3.17 MIL/uL — ABNORMAL LOW (ref 3.87–5.11)
RDW: 14.1 % (ref 11.5–15.5)
WBC: 6.5 10*3/uL (ref 4.0–10.5)
nRBC: 0 % (ref 0.0–0.2)

## 2021-04-20 LAB — GLUCOSE, CAPILLARY: Glucose-Capillary: 88 mg/dL (ref 70–99)

## 2021-04-20 SURGERY — FIXATION, FEMUR, NECK, PERCUTANEOUS, USING SCREW
Anesthesia: Spinal | Site: Hip | Laterality: Left

## 2021-04-20 MED ORDER — LIDOCAINE 2% (20 MG/ML) 5 ML SYRINGE
INTRAMUSCULAR | Status: DC | PRN
  Administered 2021-04-20: 60 mg via INTRAVENOUS

## 2021-04-20 MED ORDER — METHOCARBAMOL 1000 MG/10ML IJ SOLN
500.0000 mg | Freq: Four times a day (QID) | INTRAVENOUS | Status: DC | PRN
  Filled 2021-04-20: qty 5

## 2021-04-20 MED ORDER — PROPOFOL 10 MG/ML IV BOLUS
INTRAVENOUS | Status: DC | PRN
  Administered 2021-04-20: 80 mg via INTRAVENOUS

## 2021-04-20 MED ORDER — ALUM & MAG HYDROXIDE-SIMETH 200-200-20 MG/5ML PO SUSP: 30.0000 mL | ORAL | Status: DC | PRN

## 2021-04-20 MED ORDER — METOCLOPRAMIDE HCL 5 MG/ML IJ SOLN: 5.0000 mg | Freq: Three times a day (TID) | INTRAMUSCULAR | Status: DC | PRN

## 2021-04-20 MED ORDER — CHLORHEXIDINE GLUCONATE 0.12 % MT SOLN: 15.0000 mL | Freq: Once | OROMUCOSAL | Status: AC

## 2021-04-20 MED ORDER — LIDOCAINE 2% (20 MG/ML) 5 ML SYRINGE
INTRAMUSCULAR | Status: AC
  Filled 2021-04-20: qty 5

## 2021-04-20 MED ORDER — LACTATED RINGERS IV SOLN: INTRAVENOUS | Status: DC

## 2021-04-20 MED ORDER — ACETAMINOPHEN 325 MG PO TABS: 325.0000 mg | ORAL_TABLET | Freq: Four times a day (QID) | ORAL | Status: DC | PRN

## 2021-04-20 MED ORDER — PROPOFOL 10 MG/ML IV BOLUS
INTRAVENOUS | Status: AC
  Filled 2021-04-20: qty 20

## 2021-04-20 MED ORDER — PHENOL 1.4 % MT LIQD: 1.0000 | OROMUCOSAL | Status: DC | PRN

## 2021-04-20 MED ORDER — PROPOFOL 1000 MG/100ML IV EMUL
INTRAVENOUS | Status: AC
  Filled 2021-04-20: qty 100

## 2021-04-20 MED ORDER — 0.9 % SODIUM CHLORIDE (POUR BTL) OPTIME
TOPICAL | Status: DC | PRN
  Administered 2021-04-20: 1000 mL

## 2021-04-20 MED ORDER — ROCURONIUM BROMIDE 10 MG/ML (PF) SYRINGE
PREFILLED_SYRINGE | INTRAVENOUS | Status: AC
  Filled 2021-04-20: qty 10

## 2021-04-20 MED ORDER — HYDROCODONE-ACETAMINOPHEN 5-325 MG PO TABS
1.0000 | ORAL_TABLET | Freq: Four times a day (QID) | ORAL | Status: DC | PRN
  Administered 2021-04-21 (×2): 1 via ORAL
  Filled 2021-04-20 (×2): qty 1

## 2021-04-20 MED ORDER — BUPIVACAINE HCL (PF) 0.25 % IJ SOLN
INTRAMUSCULAR | Status: AC
  Filled 2021-04-20: qty 30

## 2021-04-20 MED ORDER — ORAL CARE MOUTH RINSE: 15.0000 mL | Freq: Once | OROMUCOSAL | Status: AC

## 2021-04-20 MED ORDER — SODIUM CHLORIDE 0.9 % IV SOLN: INTRAVENOUS | Status: DC

## 2021-04-20 MED ORDER — METHOCARBAMOL 500 MG PO TABS: 500.0000 mg | ORAL_TABLET | Freq: Four times a day (QID) | ORAL | Status: DC | PRN

## 2021-04-20 MED ORDER — ONDANSETRON HCL 4 MG/2ML IJ SOLN
INTRAMUSCULAR | Status: AC
  Filled 2021-04-20: qty 2

## 2021-04-20 MED ORDER — ACETAMINOPHEN 650 MG RE SUPP
650.0000 mg | Freq: Four times a day (QID) | RECTAL | Status: DC | PRN
  Administered 2021-04-20 (×2): 650 mg via RECTAL
  Filled 2021-04-20 (×2): qty 1

## 2021-04-20 MED ORDER — MENTHOL 3 MG MT LOZG: 1.0000 | LOZENGE | OROMUCOSAL | Status: DC | PRN

## 2021-04-20 MED ORDER — TRAMADOL HCL 50 MG PO TABS: 50.0000 mg | ORAL_TABLET | Freq: Four times a day (QID) | ORAL | Status: DC | PRN

## 2021-04-20 MED ORDER — DEXAMETHASONE SODIUM PHOSPHATE 10 MG/ML IJ SOLN
INTRAMUSCULAR | Status: DC | PRN
  Administered 2021-04-20: 4 mg via INTRAVENOUS

## 2021-04-20 MED ORDER — CEFAZOLIN SODIUM-DEXTROSE 2-4 GM/100ML-% IV SOLN
2.0000 g | Freq: Four times a day (QID) | INTRAVENOUS | Status: AC
  Administered 2021-04-21: 2 g via INTRAVENOUS
  Filled 2021-04-20 (×2): qty 100

## 2021-04-20 MED ORDER — ACETAMINOPHEN 500 MG PO TABS
500.0000 mg | ORAL_TABLET | Freq: Four times a day (QID) | ORAL | Status: DC
  Filled 2021-04-20: qty 1

## 2021-04-20 MED ORDER — ONDANSETRON HCL 4 MG/2ML IJ SOLN
INTRAMUSCULAR | Status: DC | PRN
  Administered 2021-04-20: 4 mg via INTRAVENOUS

## 2021-04-20 MED ORDER — METOCLOPRAMIDE HCL 5 MG PO TABS: 5.0000 mg | ORAL_TABLET | Freq: Three times a day (TID) | ORAL | Status: DC | PRN

## 2021-04-20 MED ORDER — FENTANYL CITRATE (PF) 100 MCG/2ML IJ SOLN: 25.0000 ug | INTRAMUSCULAR | Status: DC | PRN

## 2021-04-20 MED ORDER — SUGAMMADEX SODIUM 200 MG/2ML IV SOLN
INTRAVENOUS | Status: DC | PRN
  Administered 2021-04-20: 200 mg via INTRAVENOUS

## 2021-04-20 MED ORDER — TRANEXAMIC ACID-NACL 1000-0.7 MG/100ML-% IV SOLN
1000.0000 mg | Freq: Once | INTRAVENOUS | Status: AC
  Administered 2021-04-20: 1000 mg via INTRAVENOUS
  Filled 2021-04-20: qty 100

## 2021-04-20 MED ORDER — ONDANSETRON HCL 4 MG PO TABS: 4.0000 mg | ORAL_TABLET | Freq: Four times a day (QID) | ORAL | Status: DC | PRN

## 2021-04-20 MED ORDER — FENTANYL CITRATE (PF) 250 MCG/5ML IJ SOLN
INTRAMUSCULAR | Status: AC
  Filled 2021-04-20: qty 5

## 2021-04-20 MED ORDER — CEFAZOLIN SODIUM-DEXTROSE 2-4 GM/100ML-% IV SOLN
INTRAVENOUS | Status: AC
  Administered 2021-04-20: 2 g via INTRAVENOUS
  Filled 2021-04-20: qty 100

## 2021-04-20 MED ORDER — BISACODYL 10 MG RE SUPP: 10.0000 mg | Freq: Every day | RECTAL | Status: DC | PRN

## 2021-04-20 MED ORDER — DEXAMETHASONE SODIUM PHOSPHATE 10 MG/ML IJ SOLN
INTRAMUSCULAR | Status: AC
  Filled 2021-04-20: qty 1

## 2021-04-20 MED ORDER — PANTOPRAZOLE SODIUM 40 MG PO TBEC
40.0000 mg | DELAYED_RELEASE_TABLET | Freq: Every day | ORAL | Status: DC
  Administered 2021-04-22: 40 mg via ORAL
  Filled 2021-04-20 (×2): qty 1

## 2021-04-20 MED ORDER — ROCURONIUM BROMIDE 10 MG/ML (PF) SYRINGE
PREFILLED_SYRINGE | INTRAVENOUS | Status: DC | PRN
  Administered 2021-04-20: 40 mg via INTRAVENOUS

## 2021-04-20 MED ORDER — ACETAMINOPHEN 325 MG PO TABS
325.0000 mg | ORAL_TABLET | Freq: Four times a day (QID) | ORAL | Status: DC | PRN
  Administered 2021-04-22: 650 mg via ORAL
  Filled 2021-04-20: qty 2

## 2021-04-20 MED ORDER — ONDANSETRON HCL 4 MG/2ML IJ SOLN
4.0000 mg | Freq: Four times a day (QID) | INTRAMUSCULAR | Status: DC | PRN
Start: 2021-04-20 — End: 2021-04-23

## 2021-04-20 MED ORDER — DOCUSATE SODIUM 100 MG PO CAPS
100.0000 mg | ORAL_CAPSULE | Freq: Two times a day (BID) | ORAL | Status: DC
  Administered 2021-04-21 – 2021-04-22 (×3): 100 mg via ORAL
  Filled 2021-04-20 (×4): qty 1

## 2021-04-20 MED ORDER — FENTANYL CITRATE (PF) 250 MCG/5ML IJ SOLN
INTRAMUSCULAR | Status: DC | PRN
  Administered 2021-04-20 (×2): 50 ug via INTRAVENOUS

## 2021-04-20 MED ORDER — POLYETHYLENE GLYCOL 3350 17 G PO PACK: 17.0000 g | PACK | Freq: Every day | ORAL | Status: DC | PRN

## 2021-04-20 MED ORDER — CHLORHEXIDINE GLUCONATE 0.12 % MT SOLN
OROMUCOSAL | Status: AC
  Administered 2021-04-20: 15 mL via OROMUCOSAL
  Filled 2021-04-20: qty 15

## 2021-04-20 MED ORDER — PHENYLEPHRINE HCL-NACL 20-0.9 MG/250ML-% IV SOLN
INTRAVENOUS | Status: DC | PRN
  Administered 2021-04-20: 25 ug/min via INTRAVENOUS

## 2021-04-20 SURGICAL SUPPLY — 37 items
BAG COUNTER SPONGE SURGICOUNT (BAG) ×2 IMPLANT
BAG SPNG CNTER NS LX DISP (BAG) ×1
BIT DRILL 4.9 CANNULATED (BIT) ×1
BIT DRILL CANN QC 4.9 LRG (BIT) IMPLANT
BNDG COHESIVE 4X5 TAN STRL (GAUZE/BANDAGES/DRESSINGS) ×2 IMPLANT
BNDG GAUZE ELAST 4 BULKY (GAUZE/BANDAGES/DRESSINGS) ×2 IMPLANT
COVER PERINEAL POST (MISCELLANEOUS) ×2 IMPLANT
COVER SURGICAL LIGHT HANDLE (MISCELLANEOUS) ×2 IMPLANT
DRAPE STERI IOBAN 125X83 (DRAPES) ×2 IMPLANT
DRESSING MEPILEX FLEX 4X4 (GAUZE/BANDAGES/DRESSINGS) IMPLANT
DRILL BIT CANNULATED 4.9 (BIT) ×2
DRSG MEPILEX BORDER 4X4 (GAUZE/BANDAGES/DRESSINGS) ×2 IMPLANT
DRSG MEPILEX FLEX 4X4 (GAUZE/BANDAGES/DRESSINGS) ×2
DURAPREP 26ML APPLICATOR (WOUND CARE) ×2 IMPLANT
ELECT REM PT RETURN 15FT ADLT (MISCELLANEOUS) ×2 IMPLANT
GLOVE SRG 8 PF TXTR STRL LF DI (GLOVE) ×1 IMPLANT
GLOVE SURG ENC MOIS LTX SZ7.5 (GLOVE) ×4 IMPLANT
GLOVE SURG UNDER POLY LF SZ7.5 (GLOVE) ×2 IMPLANT
GLOVE SURG UNDER POLY LF SZ8 (GLOVE) ×2
GOWN STRL REUS W/ TWL LRG LVL3 (GOWN DISPOSABLE) ×3 IMPLANT
GOWN STRL REUS W/TWL LRG LVL3 (GOWN DISPOSABLE) ×6
GUIDEWIRE ASNIS 3.2 NONCAL (WIRE) ×2 IMPLANT
GUIDEWIRE THRD ASNIS 3.2X300 (WIRE) ×1 IMPLANT
KIT BASIN OR (CUSTOM PROCEDURE TRAY) ×2 IMPLANT
KIT TURNOVER KIT A (KITS) ×2 IMPLANT
MANIFOLD NEPTUNE II (INSTRUMENTS) ×2 IMPLANT
NS IRRIG 1000ML POUR BTL (IV SOLUTION) ×2 IMPLANT
PACK GENERAL/GYN (CUSTOM PROCEDURE TRAY) ×2 IMPLANT
PAD ARMBOARD 7.5X6 YLW CONV (MISCELLANEOUS) ×4 IMPLANT
SCREW ASNIS 85MM (Screw) ×1 IMPLANT
SCREW CANN 6.5X80 STRL (Screw) ×2 IMPLANT
SUT MON AB 2-0 CT1 36 (SUTURE) ×2 IMPLANT
SUT VIC AB 0 CT1 27 (SUTURE)
SUT VIC AB 0 CT1 27XBRD ANBCTR (SUTURE) IMPLANT
TOWEL OR 17X26 10 PK STRL BLUE (TOWEL DISPOSABLE) ×4 IMPLANT
WASHER SCREW MATTA SS 13.0X1.5 (Washer) ×2 IMPLANT
WATER STERILE IRR 1000ML POUR (IV SOLUTION) ×2 IMPLANT

## 2021-04-20 NOTE — Progress Notes (Signed)
PROGRESS NOTE   Rita Torres  [id number]    DOB: 06/15/1932    DOA: 04/19/2021  PCP: Lawerance Cruel, MD   I have briefly reviewed patients previous medical records in [facility].  Chief Complaint  Patient presents with   Fall    Brief Narrative:  88 year old female with medical history significant for advanced dementia with behavioral disturbance, vitamin D deficiency, recent right subcapital femoral neck fracture s/p cannulated hip pinning on 02/12/2021 by Dr. Percell Miller, presented from nursing facility following an unwitnessed fall and staff found patient lying on her left side with facial trauma.  Admitted for fracture of floor of the left orbit and subcapital left femoral neck fracture with posterior lateral impaction.  ENT recommends conservative management for orbital fracture.  Orthopedics consulted and plan left hip fracture pinning.   Assessment & Plan:  Principal Problem:   Closed left hip fracture (HCC) Active Problems:   Dementia with behavioral disturbance   Macrocytic anemia   Anxiety and depression   Thrombocytopenia (HCC)   Dehydration   Closed fracture of left orbital floor [facility])   Fall   Closed left subcapital femoral neck fracture, secondary to unwitnessed fall at nursing home: S/p Tdap booster in ED.  Orthopedics consultation appreciated and plan cannulated hip pinning on 1/12.  May proceed with indicated procedure without any further work-up.  Multimodality pain control.  Minimize opioids as much as possible.  Delirium precautions.  Closed left orbital floor fracture: Also due to unwitnessed fall at nursing facility.  Imaging showed layering blood in the left maxillary sinus.  ENT/Dr. Marcelline Deist input appreciated and advises that given her comorbidities, use of platelet inhibitors and absence of ocular entrapment on exam and scan, this does not need to be fixed acutely and suspects that this will heal without needing anything done.  He recommends  reevaluation by ENT in 1 week particularly if having any trouble with diplopia or pain with eye movement.  Advanced dementia with behavioral disturbance: As per H&P, at baseline patient can feed herself and at the memory care unit where she had been getting around with the use of wheelchair and even standing.  Delirium precautions.  Minimize sedative/opioids as much as possible.  Resume home meds including Depakote, Aricept and Namenda after swallow eval.  Dehydration: Holding furosemide due to soft blood pressures on admission.  Ongoing gentle IV fluids hydration.  Macrocytic anemia: Baseline hemoglobin probably in the 9 g range.  Hemoglobin at baseline.  Follow CBC daily.  Vitamin B12 247 in November 2022.  Consider B12 supplements.  Thrombocytopenia: Platelet counts in November 2022 were ranging in the 110s-140s.  Now presented with platelet counts in the 80s, stable since yesterday.  Follow daily CBC.  Anxiety and depression: Resume home regimen including Celexa and Ativan when able to tolerate p.o.  History of right hip fracture: Underwent pinning by Dr. Percell Miller 02/2021.  Reportedly placed on Eliquis postprocedure-should have completed 30-day course so unsure why she is still on it.  Orthopedics to address.  Adult failure to thrive: Multifactorial due to very advanced age, severe dementia, frailty and medical problems.  Consider palliative care consultation which can be done as an outpatient at the memory care unit.  Body mass index is 20.18 kg/m.    DVT prophylaxis: SCDs Start: 04/19/21 0735     Code Status: DNR Family Communication: None at bedside. Disposition:  Status is: Inpatient  Remains inpatient appropriate because: Need for hip fracture surgery  Consultants:   Orthopedics  Procedures:   None  Antimicrobials:   None  Subjective:  Patient nonverbal.  Fidgeting in bed.  Objective:   Vitals:   04/19/21 1400 04/19/21 1500 04/19/21 2041 04/20/21 0727   BP: 138/78 129/76 126/80 136/76  Pulse: 79 74 67 70  Resp: (!) 21 14 18    Temp:   98.7 F (37.1 C) 99 F (37.2 C)  TempSrc:   Oral Axillary  SpO2: 96% 96% 93% 93%  Weight:      Height:        General exam: Elderly female, small built, frail, chronically ill looking, lying in fetal position/left lateral position in bed.  Fidgety and keeps pulling at her clothes and sheets.  Pushes away examiner's hands.  Mumbles incomprehensibly. Respiratory system: Clear to auscultation. Respiratory effort normal. Cardiovascular system: S1 & S2 heard, RRR. No JVD, murmurs, rubs, gallops or clicks. No pedal edema. Gastrointestinal system: Abdomen is nondistended, soft and nontender. No organomegaly or masses felt. Normal bowel sounds heard. Central nervous system: Appears alert but eyes closed, does not follow instructions, mumbles incomprehensibly. No focal neurological deficits. Extremities: Symmetric 5 x 5 power in upper extremities.  Both lower extremities crouched up and fetal position and will not allow exam. Skin: No rashes, lesions or ulcers Psychiatry: Judgement and insight impaired. Mood & affect cannot be assessed.     Data Reviewed:   I have personally reviewed following labs and imaging studies   CBC: Recent Labs  Lab 04/19/21 0241 04/20/21 0338  WBC 7.4 6.5  NEUTROABS 6.4  --   HGB 10.3* 9.8*  HCT 35.5* 31.0*  MCV 102.9* 97.8  PLT 89* 81*    Basic Metabolic Panel: Recent Labs  Lab 04/19/21 0241 04/20/21 0338  NA 140 140  K 4.2 3.8  CL 106 109  CO2 26 24  GLUCOSE 123* 94  BUN 35* 27*  CREATININE 0.76 0.83  CALCIUM 8.9 8.4*    Liver Function Tests: No results for input(s): AST, ALT, ALKPHOS, BILITOT, PROT, ALBUMIN in the last 168 hours.  CBG: Recent Labs  Lab 04/19/21 2100 04/20/21 0655  GLUCAP 100* 88    Microbiology Studies:   Recent Results (from the past 240 hour(s))  Resp Panel by RT-PCR (Flu A&B, Covid) Nasopharyngeal Swab     Status: None    Collection Time: 04/19/21  6:08 AM   Specimen: Nasopharyngeal Swab; Nasopharyngeal(NP) swabs in vial transport medium  Result Value Ref Range Status   SARS Coronavirus 2 by RT PCR NEGATIVE NEGATIVE Final    Comment: (NOTE) SARS-CoV-2 target nucleic acids are NOT DETECTED.  The SARS-CoV-2 RNA is generally detectable in upper respiratory specimens during the acute phase of infection. The lowest concentration of SARS-CoV-2 viral copies this assay can detect is 138 copies/mL. A negative result does not preclude SARS-Cov-2 infection and should not be used as the sole basis for treatment or other patient management decisions. A negative result may occur with  improper specimen collection/handling, submission of specimen other than nasopharyngeal swab, presence of viral mutation(s) within the areas targeted by this assay, and inadequate number of viral copies(<138 copies/mL). A negative result must be combined with clinical observations, patient history, and epidemiological information. The expected result is Negative.  Fact Sheet for Patients:  EntrepreneurPulse.com.au  Fact Sheet for Healthcare Providers:  IncredibleEmployment.be  This test is no t yet approved or cleared by the [place] FDA and  has been authorized for detection and/or diagnosis of SARS-CoV-2 by FDA under  an Emergency Use Authorization (EUA). This EUA will remain  in effect (meaning this test can be used) for the duration of the COVID-19 declaration under Section 564(b)(1) of the Act, 21 U.S.C.section 360bbb-3(b)(1), unless the authorization is terminated  or revoked sooner.       Influenza A by PCR NEGATIVE NEGATIVE Final   Influenza B by PCR NEGATIVE NEGATIVE Final    Comment: (NOTE) The Xpert Xpress SARS-CoV-2/FLU/RSV plus assay is intended as an aid in the diagnosis of influenza from Nasopharyngeal swab specimens and should not be used as a sole basis for treatment.  Nasal washings and aspirates are unacceptable for Xpert Xpress SARS-CoV-2/FLU/RSV testing.  Fact Sheet for Patients: BloggerCourse.com  Fact Sheet for Healthcare Providers: SeriousBroker.it  This test is not yet approved or cleared by the [place] FDA and has been authorized for detection and/or diagnosis of SARS-CoV-2 by FDA under an Emergency Use Authorization (EUA). This EUA will remain in effect (meaning this test can be used) for the duration of the COVID-19 declaration under Section 564(b)(1) of the Act, 21 U.S.C. section 360bbb-3(b)(1), unless the authorization is terminated or revoked.  Performed at [facility], [address] [place], [place] [address]     Radiology Studies:  CT HEAD WO CONTRAST ( )  Result Date: 04/19/2021 CLINICAL DATA:  Head trauma, minor (Age >= 65y) Head trauma, moderate-severe EXAM: CT HEAD WITHOUT CONTRAST TECHNIQUE: Contiguous axial images were obtained from the base of the skull through the vertex without intravenous contrast. RADIATION DOSE REDUCTION: This exam was performed according to the departmental dose-optimization program which includes automated exposure control, adjustment of the mA and/or kV according to patient size and/or use of iterative reconstruction technique. COMPARISON:  02/11/2021 FINDINGS: Brain: There is atrophy and chronic small vessel disease changes. No acute intracranial abnormality. Specifically, no hemorrhage, hydrocephalus, mass lesion, acute infarction, or significant intracranial injury. Vascular: No hyperdense vessel or unexpected calcification. Skull: No acute calvarial abnormality. Sinuses/Orbits: Air-fluid level in the left maxillary sinus. Fracture through the floor of the left orbit seen on coronal imaging. Mildly depressed fracture fragments. No evidence of entrapment. Other: Soft tissue swelling over the left orbit. IMPRESSION: Atrophy, chronic  microvascular disease. No acute intracranial abnormality. Fracture through the floor the left orbit. Mildly depressed fracture fragments without evidence of entrapment. Overlying soft tissue swelling. Electronically Signed   By: Charlett Nose M.D.   On: 04/19/2021 03:09   CT Cervical Spine Wo Contrast  Result Date: 04/19/2021 CLINICAL DATA:  Neck trauma (Age >= 65y) EXAM: CT CERVICAL SPINE WITHOUT CONTRAST TECHNIQUE: Multidetector CT imaging of the cervical spine was performed without intravenous contrast. Multiplanar CT image reconstructions were also generated. RADIATION DOSE REDUCTION: This exam was performed according to the departmental dose-optimization program which includes automated exposure control, adjustment of the mA and/or kV according to patient size and/or use of iterative reconstruction technique. COMPARISON:  02/11/2021 FINDINGS: Alignment: Normal Skull base and vertebrae: No acute fracture. No primary bone lesion or focal pathologic process. Soft tissues and spinal canal: No prevertebral fluid or swelling. No visible canal hematoma. Disc levels:  Diffuse degenerative disc disease and facet disease. Upper chest: No acute findings Other: Left orbital floor fracture again noted as seen on head CT with blood layering in the left maxillary sinus. IMPRESSION: No acute bony abnormality in the cervical spine. Left orbital floor fracture with layering blood in the left maxillary sinus. Electronically Signed   By: Charlett Nose M.D.   On: 04/19/2021 03:11  DG Pelvis Portable  Result Date: 04/19/2021 CLINICAL DATA:  Fall with EXAM: PORTABLE PELVIS 1-2 VIEWS COMPARISON:  Right hip radiograph dated 02/11/2021 and pelvic radiograph dated 03/22/2020. FINDINGS: Evaluation is very limited due to positioning. Prior fixation screw of the right femoral neck fracture. The screws appear intact as visualized. There is foreshortened appearance of the left femoral neck with an area of angulation. Although this may  be positional, a femoral neck fracture is not excluded. Further evaluation with CT is recommended. The bones are osteopenic. No dislocation. The soft tissues are unremarkable. IMPRESSION: Findings concerning for fracture of the left femoral neck. Further evaluation with CT recommended. Electronically Signed   By: Anner Crete M.D.   On: 04/19/2021 02:54   CT Hip Left Wo Contrast  Result Date: 04/19/2021 CLINICAL DATA:  Hip trauma with fracture suspected. X-ray completed. EXAM: CT OF THE LEFT HIP WITHOUT CONTRAST TECHNIQUE: Multidetector CT imaging of the left hip was performed according to the standard protocol. Multiplanar CT image reconstructions were also generated. RADIATION DOSE REDUCTION: This exam was performed according to the departmental dose-optimization program which includes automated exposure control, adjustment of the mA and/or kV according to patient size and/or use of iterative reconstruction technique. COMPARISON:  Preceding radiography FINDINGS: Bones/Joint/Cartilage Subcapital left femoral neck fracture with posterior impaction. Mild for age degenerative spurring at the hip joint. No visible pelvic ring fracture. Ligaments Suboptimally assessed by CT. Muscles and Tendons No evidence of major muscular ligamentous disruption. Soft tissues Expected swelling around the injury. The rectum and distal sigmoid are distended by stool. IMPRESSION: Subcapital left femoral neck fracture with posterolateral impaction. Electronically Signed   By: Jorje Guild M.D.   On: 04/19/2021 05:56   DG Chest Portable 1 View  Result Date: 04/19/2021 CLINICAL DATA:  Fall. EXAM: PORTABLE CHEST 1 VIEW COMPARISON:  Chest radiograph dated 02/11/2021. FINDINGS: No focal consolidation, pleural effusion or pneumothorax. Mild cardiomegaly. No acute osseous pathology. IMPRESSION: 1. No acute cardiopulmonary process. 2. Mild cardiomegaly. Electronically Signed   By: Anner Crete M.D.   On: 04/19/2021 02:52     Scheduled Meds:    chlorhexidine  60 mL Topical Once   cholecalciferol  1,000 Units Oral Daily   citalopram  10 mg Oral Daily   divalproex  250 mg Oral QHS   docusate sodium  100 mg Oral BID   donepezil  10 mg Oral Daily   feeding supplement   Oral BID   LORazepam  0.25 mg Oral BID   memantine  10 mg Oral BID   senna  1 tablet Oral BID    Continuous Infusions:     ceFAZolin (ANCEF) IV       LOS: 1 day     Vernell Leep, MD,  FACP, [facility], [facility], [facility] (Care Management Physician Certified) [place]  To contact the attending provider between 7A-7P or the covering provider during after hours 7P-7A, please log into the web site [email or web address] and access using universal [place] password for that web site. If you do not have the password, please call the hospital operator.  04/20/2021, 10:37 AM

## 2021-04-20 NOTE — Anesthesia Procedure Notes (Signed)
Procedure Name: Intubation Date/Time: 04/20/2021 3:23 PM Performed by: Pearson Grippe, CRNA Pre-anesthesia Checklist: Patient identified, Emergency Drugs available, Suction available and Patient being monitored Patient Re-evaluated:Patient Re-evaluated prior to induction Oxygen Delivery Method: Circle system utilized Preoxygenation: Pre-oxygenation with 100% oxygen Induction Type: IV induction Ventilation: Mask ventilation without difficulty Laryngoscope Size: Miller and 2 Grade View: Grade I Tube type: Oral Tube size: 7.0 mm Number of attempts: 1 Airway Equipment and Method: Stylet and Oral airway Placement Confirmation: ETT inserted through vocal cords under direct vision, positive ETCO2 and breath sounds checked- equal and bilateral Secured at: 22 cm Tube secured with: Tape Dental Injury: Teeth and Oropharynx as per pre-operative assessment

## 2021-04-20 NOTE — Transfer of Care (Signed)
Immediate Anesthesia Transfer of Care Note  Patient: Rita Torres  Procedure(s) Performed: CANNULATED HIP PINNING (Left: Hip)  Patient Location: PACU  Anesthesia Type:General  Level of Consciousness: awake and alert   Airway & Oxygen Therapy: Patient Spontanous Breathing and Patient connected to face mask oxygen  Post-op Assessment: Report given to RN and Post -op Vital signs reviewed and stable  Post vital signs: Reviewed and stable  Last Vitals:  Vitals Value Taken Time  BP 154/85 04/20/21 1623  Temp    Pulse 68 04/20/21 1625  Resp 14 04/20/21 1625  SpO2 100 % 04/20/21 1625  Vitals shown include unvalidated device data.  Last Pain:  Vitals:   04/20/21 0748  TempSrc:   PainSc: Asleep         Complications: No notable events documented.

## 2021-04-20 NOTE — Op Note (Signed)
04/19/2021 - 04/20/2021  4:03 PM  PATIENT:  Rita Torres    PRE-OPERATIVE DIAGNOSIS:  Left hip fracture  POST-OPERATIVE DIAGNOSIS:  Same  PROCEDURE:  CANNULATED HIP PINNING  SURGEON:  Sheral Apley, MD  ASSISTANT: Levester Fresh, PA-C, he was present and scrubbed throughout the case, critical for completion in a timely fashion, and for retraction, instrumentation, and closure.   ANESTHESIA:   General  PREOPERATIVE INDICATIONS:  Rita Torres is a  88 y.o. female who fell and was found to have a diagnosis of Left hip fracture who elected for surgical management.    The risks benefits and alternatives were discussed with the patient preoperatively including but not limited to the risks of infection, bleeding, nerve injury, cardiopulmonary complications, blood clots, malunion, nonunion, avascular necrosis, the need for revision surgery, the potential for conversion to hemiarthroplasty, among others, and the patient was willing to proceed.  OPERATIVE IMPLANTS: 6.5 mm cannulated screws x3  OPERATIVE FINDINGS: Clinical osteoporosis with weak bone, proximal femur  OPERATIVE PROCEDURE: The patient was brought to the operating room and placed in supine position. IV antibiotics were given. General anesthesia administered. Foley was also given. The patient was placed on the fracture table. The operative extremity was positioned, without any significant reduction maneuver and was prepped and draped in usual sterile fashion.  Time out was performed.  Small incisions were made distal to the greater trochanter, and 3 guidewires were introduced Into an inverted triangle configuration. The lengths were measured. The reduction was slightly valgus, and near-anatomic. I opened the cortex with a cannulated drill, and then placed the screws into position. Satisfactory fixation was achieved. I sequentially tightened the screws by hand.  I performed a live fluoroscopic exam and no screw penetrance was  noted. All threads crossed the fracture site.   The wounds were irrigated copiously, and repaired with Vicryl with Steri-Strips and sterile gauze. There no complications and the patient tolerated the procedure well.  The patient will be weightbearing as tolerated, VTE prophylaxis will be: mobilize and chemical px

## 2021-04-20 NOTE — Interval H&P Note (Signed)
History and Physical Interval Note:  04/20/2021 12:32 PM  Rita Torres  has presented today for surgery, with the diagnosis of Left hip fracture.  The various methods of treatment have been discussed with the patient and family. After consideration of risks, benefits and other options for treatment, the patient has consented to  Procedure(s): CANNULATED HIP PINNING (Left) as a surgical intervention.  The patient's history has been reviewed, patient examined, no change in status, stable for surgery.  I have reviewed the patient's chart and labs.  Questions were answered to the patient's satisfaction.     Sheral Apley

## 2021-04-20 NOTE — Anesthesia Preprocedure Evaluation (Addendum)
Anesthesia Evaluation  Patient identified by MRN, date of birth, ID band Patient confused    Reviewed: Allergy & Precautions, NPO status , Patient's Chart, lab work & pertinent test results  Airway Mallampati: I       Dental no notable dental hx. (+) Teeth Intact   Pulmonary    Pulmonary exam normal breath sounds clear to auscultation       Cardiovascular negative cardio ROS Normal cardiovascular exam Rhythm:Regular Rate:Normal     Neuro/Psych PSYCHIATRIC DISORDERS Dementia    GI/Hepatic   Endo/Other    Renal/GU negative Renal ROS     Musculoskeletal   Abdominal Normal abdominal exam  (+)   Peds  Hematology  (+) anemia ,   Anesthesia Other Findings   Reproductive/Obstetrics                             Anesthesia Physical  Anesthesia Plan  ASA: 3  Anesthesia Plan: General   Post-op Pain Management:    Induction: Intravenous  PONV Risk Score and Plan: 3 and Ondansetron, Propofol infusion and Treatment may vary due to age or medical condition  Airway Management Planned: Oral ETT  Additional Equipment: None  Intra-op Plan:   Post-operative Plan: Extubation in OR  Informed Consent: I have reviewed the patients History and Physical, chart, labs and discussed the procedure including the risks, benefits and alternatives for the proposed anesthesia with the patient or authorized representative who has indicated his/her understanding and acceptance.   Patient has DNR.  Discussed DNR with power of attorney and Continue DNR.   Consent reviewed with POA  Plan Discussed with: CRNA  Anesthesia Plan Comments: (          )       Anesthesia Quick Evaluation

## 2021-04-20 NOTE — Progress Notes (Signed)
SLP Cancellation Note  Patient Details Name: Rita Torres MRN: [id number] DOB: 11/28/1932   Cancelled treatment:        Per RN, pt is scheduled for hip surgery today, therefore NPO - will f/u for swallow evaluation when appropriate.  Burl Tauzin L. Samson Frederic, MA CCC/SLP Acute Rehabilitation Services Office number [phone] Pager [phone]    [name] 04/20/2021, 10:52 AM

## 2021-04-21 ENCOUNTER — Encounter (HOSPITAL_COMMUNITY): Payer: Self-pay | Admitting: Orthopedic Surgery

## 2021-04-21 LAB — BASIC METABOLIC PANEL
Anion gap: 6 (ref 5–15)
BUN: 29 mg/dL — ABNORMAL HIGH (ref 8–23)
CO2: 24 mmol/L (ref 22–32)
Calcium: 7.9 mg/dL — ABNORMAL LOW (ref 8.9–10.3)
Chloride: 109 mmol/L (ref 98–111)
Creatinine, Ser: 0.74 mg/dL (ref 0.44–1.00)
GFR, Estimated: >60 mL/min (ref >60)
Glucose, Bld: 120 mg/dL — ABNORMAL HIGH (ref 70–99)
Potassium: 4 mmol/L (ref 3.5–5.1)
Sodium: 139 mmol/L (ref 135–145)

## 2021-04-21 LAB — CBC
HCT: 29.8 % — ABNORMAL LOW (ref 36.0–46.0)
Hemoglobin: 9.2 g/dL — ABNORMAL LOW (ref 12.0–15.0)
MCH: 30.5 pg (ref 26.0–34.0)
MCHC: 30.9 g/dL (ref 30.0–36.0)
MCV: 98.7 fL (ref 80.0–100.0)
Platelets: 71 10*3/uL — ABNORMAL LOW (ref 150–400)
RBC: 3.02 MIL/uL — ABNORMAL LOW (ref 3.87–5.11)
RDW: 14.1 % (ref 11.5–15.5)
WBC: 6.9 10*3/uL (ref 4.0–10.5)
nRBC: 0 % (ref 0.0–0.2)

## 2021-04-21 MED ORDER — APIXABAN 2.5 MG PO TABS: 2.5000 mg | ORAL_TABLET | Freq: Two times a day (BID) | ORAL | 0 refills | Status: DC

## 2021-04-21 MED ORDER — APIXABAN 5 MG PO TABS
5.0000 mg | ORAL_TABLET | Freq: Two times a day (BID) | ORAL | Status: DC
  Administered 2021-04-21: 5 mg via ORAL
  Filled 2021-04-21: qty 1

## 2021-04-21 MED ORDER — TRAMADOL HCL 50 MG PO TABS
50.0000 mg | ORAL_TABLET | Freq: Four times a day (QID) | ORAL | 0 refills | Status: AC | PRN
Start: 2021-04-21 — End: 2022-04-21

## 2021-04-21 MED ORDER — APIXABAN 2.5 MG PO TABS
2.5000 mg | ORAL_TABLET | Freq: Two times a day (BID) | ORAL | Status: DC
  Administered 2021-04-21 – 2021-04-22 (×3): 2.5 mg via ORAL
  Filled 2021-04-21 (×3): qty 1

## 2021-04-21 NOTE — Progress Notes (Addendum)
Subjective: Patient has severe dementia and is not able to contribute to her history. Her son is in the room and reports she seems to be doing much better than she was after her prior hip pinning in November. She has been more alert and like herself. Pain seems to be controlled. Tolerating diet. Urinating.  Was able to move with PT OOB and into the bedside chair.   Objective:   VITALS:   Vitals:   04/21/21 0339 04/21/21 0500 04/21/21 0610 04/21/21 0654  BP: 133/81   128/78  Pulse: 63  61 64  Resp: 17  18 17   Temp: 98.4 F (36.9 C)   98.2 F (36.8 C)  TempSrc: Axillary   Oral  SpO2: 96%  97%   Weight:  55.6 kg    Height:       CBC Latest Ref Rng & Units 04/21/2021 04/20/2021 04/19/2021  WBC 4.0 - 10.5 K/uL 6.9 6.5 7.4  Hemoglobin 12.0 - 15.0 g/dL 9.2(L) 9.8(L) 10.3(L)  Hematocrit 36.0 - 46.0 % 29.8(L) 31.0(L) 35.5(L)  Platelets 150 - 400 K/uL 71(L) 81(L) 89(L)   BMP Latest Ref Rng & Units 04/21/2021 04/20/2021 04/19/2021  Glucose 70 - 99 mg/dL 120(H) 94 123(H)  BUN 8 - 23 mg/dL 29(H) 27(H) 35(H)  Creatinine 0.44 - 1.00 mg/dL 0.74 0.83 0.76  BUN/Creat Ratio 6 - 22 (calc) - - -  Sodium 135 - 145 mmol/L 139 140 140  Potassium 3.5 - 5.1 mmol/L 4.0 3.8 4.2  Chloride 98 - 111 mmol/L 109 109 106  CO2 22 - 32 mmol/L 24 24 26   Calcium 8.9 - 10.3 mg/dL 7.9(L) 8.4(L) 8.9   Intake/Output      01/12 0701 01/13 0700 01/13 0701 01/14 0700   I.V. (mL/kg) 1384.2 (24.9)    IV Piggyback 200    Total Intake(mL/kg) 1584.2 (28.5)    Urine (mL/kg/hr) 300 (0.2)    Blood 10    Total Output 310    Net +1274.2         Urine Occurrence 2 x       Physical Exam: General: NAD.  Laying in bed. Calm, rambling in a low voice in nonsensical speech Resp: No increased wob Cardio: regular rate and rhythm ABD soft Neurologically intact MSK Neurovascularly intact Sensation intact distally Intact pulses distally Dorsiflexion/Plantar flexion intact Incision: dressing C/D/I   Assessment: 1 Day  Post-Op  S/P Procedure(s) (LRB): CANNULATED HIP PINNING (Left) by Dr. Ernesta Amble. Percell Miller on 04/20/21  Principal Problem:   Closed left hip fracture (HCC) Active Problems:   Dementia with behavioral disturbance   Macrocytic anemia   Anxiety and depression   Thrombocytopenia (HCC)   Dehydration   Closed fracture of left orbital floor ([place])   Fall   Plan:  Advance diet Up with therapy if able Incentive Spirometry Elevate and Apply ice  Weightbearing: WBAT LLE Insicional and dressing care: Dressings left intact until follow-up and Reinforce dressings as needed Orthopedic device(s): None Showering: Keep dressing dry VTE prophylaxis:  on Eliquis 5mg  bid for A fib  , SCDs, ambulation Pain control: Tylenol, Tramadol; minimize narcotics due to delirium Follow - up plan: 2 weeks post-op if able to transport to office otherwise we can do a telehealth appt Contact information:  Edmonia Lynch MD, Aggie Moats PA-C  Dispo: Skilled Nursing Facility/Rehab. She will return to [facility] where she came from. Her son reports that due to her cognitive state she was not able to do PT after the  hip pinning in November so she likely won't be able to do much now either. He says the family is likely to get Hospice and Palliative Care involved now.   Stable for d/c from an ortho standpoint. I will print and sign the script for pain medicine and place in her chart. She is already on Eliquis 5mg  as a home medicine so she can continue that for DVT prophylaxis.     Britt Bottom, PA-C Office [phone] 04/21/2021, 9:51 AM

## 2021-04-21 NOTE — Anesthesia Postprocedure Evaluation (Signed)
Anesthesia Post Note  Patient: Rita Torres  Procedure(s) Performed: CANNULATED HIP PINNING (Left: Hip)     Patient location during evaluation: PACU Anesthesia Type: General Level of consciousness: awake and alert Pain management: pain level controlled Vital Signs Assessment: post-procedure vital signs reviewed and stable Respiratory status: spontaneous breathing, nonlabored ventilation, respiratory function stable and patient connected to nasal cannula oxygen Cardiovascular status: blood pressure returned to baseline and stable Postop Assessment: no apparent nausea or vomiting Anesthetic complications: no   No notable events documented.  Last Vitals:  Vitals:   04/21/21 0339 04/21/21 0610  BP: 133/81   Pulse: 63 61  Resp: 17 18  Temp: 36.9 C   SpO2: 96% 97%    Last Pain:  Vitals:   04/21/21 0339  TempSrc: Axillary  PainSc:                  [name]

## 2021-04-21 NOTE — Progress Notes (Addendum)
PROGRESS NOTE   Rita Torres  OJJ:[id number]    DOB: October 19, 1932    DOA: 04/19/2021  PCP: Daisy Floro, MD   I have briefly reviewed patients previous medical records in [facility].  Chief Complaint  Patient presents with   Fall    Brief Narrative:  88 year old female with medical history significant for advanced dementia with behavioral disturbance, vitamin D deficiency, recent right subcapital femoral neck fracture s/p cannulated hip pinning on 02/12/2021 by Dr. Eulah Pont, presented from nursing facility following an unwitnessed fall and staff found patient lying on her left side with facial trauma.  Admitted for fracture of floor of the left orbit and subcapital left femoral neck fracture with posterior lateral impaction.  ENT recommends conservative management for orbital fracture.  Orthopedics consulted and s/p left cannulated hip pinning 1/12.  Family has now opted to discharge patient back to prior memory care unit with hospice.   Assessment & Plan:  Principal Problem:   Closed left hip fracture (HCC) Active Problems:   Dementia with behavioral disturbance   Macrocytic anemia   Anxiety and depression   Thrombocytopenia (HCC)   Dehydration   Closed fracture of left orbital floor [facility])   Fall   Closed left subcapital femoral neck fracture, secondary to unwitnessed fall at nursing home: S/p Tdap booster in ED.  Orthopedics consultation appreciated and s/p cannulated hip pinning on 1/12.  Multimodality pain control.  Minimize opioids as much as possible.  Delirium precautions.  Pain seems to be adequately controlled.  Per orthopedics, WBAT LLE, dressings to be left intact until outpatient follow-up, now on Eliquis 2.5 Mg twice daily for postop DVT prophylaxis.  Outpatient follow-up with orthopedics versus telehealth follow-up in 2 weeks.  She has been cleared for DC from orthopedic standpoint.  Closed left orbital floor fracture: Also due to unwitnessed fall at nursing  facility.  Imaging showed layering blood in the left maxillary sinus.  ENT/Dr. Elijah Birk input appreciated and advises that given her comorbidities, use of platelet inhibitors and absence of ocular entrapment on exam and scan, this does not need to be fixed acutely and suspects that this will heal without needing anything done.  He recommends reevaluation by ENT in 1 week particularly if having any trouble with diplopia or pain with eye movement.  Advanced dementia with behavioral disturbance: As per H&P, at baseline patient can feed herself and at the memory care unit where she had been getting around with the use of wheelchair and even standing.  Delirium precautions.  Minimize sedative/opioids as much as possible.  Continue home meds including Depakote, Aricept and Namenda.  Dehydration: Holding furosemide due to soft blood pressures on admission.  Briefly on IV fluids, okay to stop now.  Macrocytic anemia: Baseline hemoglobin probably in the 9 g range.  Hemoglobin at baseline.  Follow CBC daily.  Vitamin B12 247 in November 2022.  Consider B12 supplements.  Hemoglobin stable.  Thrombocytopenia: Platelet counts in November 2022 were ranging in the 110s-140s.  Now presented with platelet counts in the 80s, stable since yesterday.  Follow daily CBC.  Platelet count relatively stable.  Anxiety and depression: Continue home regimen including Celexa and Ativan when able to tolerate p.o.  History of right hip fracture: Underwent pinning by Dr. Eulah Pont 02/2021.  Reportedly placed on Eliquis postprocedure-should have completed 30-day course so unsure why she was still on it.  Now on reduced dose Eliquis for current surgical postop DVT prophylaxis.  Dysphagia: Speech therapy has evaluated and  recommend regular diet and nectar thickened liquids.  Adult failure to thrive: Multifactorial due to very advanced age, severe dementia, frailty and medical problems.  Family has appropriately decided to transition her  to hospice at current facility.  Hopeful discharge tomorrow.  Body mass index is 20.4 kg/m.    DVT prophylaxis: apixaban (ELIQUIS) tablet 2.5 mg Start: 04/21/21 2200 SCDs Start: 04/20/21 1629     Code Status: DNR Family Communication: Son at bedside. Disposition:  Status is: Inpatient  Remains inpatient appropriate because: Postop progress, hopeful DC to memory care unit with hospice on 1/14.        Consultants:   Orthopedics  Procedures:   S/p cannulated left hip fracture pinning on 1/12.  Antimicrobials:   None  Subjective:  Interviewed and examined along with patient's son at bedside.  Patient sitting up in bed.  Looks improved compared to yesterday.  Awake and alert, recognizes her son.  Less fidgety.  Did not appear in pain.  No history available from patient due to advanced dementia.  Objective:   Vitals:   04/21/21 0500 04/21/21 0610 04/21/21 0654 04/21/21 1418  BP:   128/78 135/69  Pulse:  61 64 77  Resp:  18 17 17   Temp:   98.2 F (36.8 C) 99.1 F (37.3 C)  TempSrc:   Oral Oral  SpO2:  97%  96%  Weight: 55.6 kg     Height:        General exam: Elderly female, small built, frail, chronically ill looking, sitting up in bed without discomfort. HEENT: Left periorbital ecchymosis.  Left lateral supraorbital area laceration with sutures intact.  Left eye without subconjunctival hemorrhage and pupil reacting to light. Respiratory system: Clear to auscultation.  No increased work of breathing. Cardiovascular system: S1 & S2 heard, RRR. No JVD, murmurs, rubs, gallops or clicks. No pedal edema.  Telemetry personally reviewed: Sinus rhythm, DC'd telemetry 1/13 Gastrointestinal system: Abdomen is nondistended, soft and nontender. No organomegaly or masses felt. Normal bowel sounds heard. Central nervous system: Alert but not oriented. No focal neurological deficits. Extremities: Symmetric 5 x 5 power in upper extremities.  Left hip surgical site dressing clean and  dry. Skin: No rashes, lesions or ulcers Psychiatry: Judgement and insight impaired. Mood & affect cannot be assessed.     Data Reviewed:   I have personally reviewed following labs and imaging studies   CBC: Recent Labs  Lab 04/19/21 0241 04/20/21 0338 04/21/21 0243  WBC 7.4 6.5 6.9  NEUTROABS 6.4  --   --   HGB 10.3* 9.8* 9.2*  HCT 35.5* 31.0* 29.8*  MCV 102.9* 97.8 98.7  PLT 89* 81* 71*    Basic Metabolic Panel: Recent Labs  Lab 04/19/21 0241 04/20/21 0338 04/21/21 0243  NA 140 140 139  K 4.2 3.8 4.0  CL 106 109 109  CO2 26 24 24   GLUCOSE 123* 94 120*  BUN 35* 27* 29*  CREATININE 0.76 0.83 0.74  CALCIUM 8.9 8.4* 7.9*    Liver Function Tests: No results for input(s): AST, ALT, ALKPHOS, BILITOT, PROT, ALBUMIN in the last 168 hours.  CBG: Recent Labs  Lab 04/19/21 2100 04/20/21 0655  GLUCAP 100* 88    Microbiology Studies:   Recent Results (from the past 240 hour(s))  Resp Panel by RT-PCR (Flu A&B, Covid) Nasopharyngeal Swab     Status: None   Collection Time: 04/19/21  6:08 AM   Specimen: Nasopharyngeal Swab; Nasopharyngeal(NP) swabs in vial transport medium  Result Value Ref  Range Status   SARS Coronavirus 2 by RT PCR NEGATIVE NEGATIVE Final    Comment: (NOTE) SARS-CoV-2 target nucleic acids are NOT DETECTED.  The SARS-CoV-2 RNA is generally detectable in upper respiratory specimens during the acute phase of infection. The lowest concentration of SARS-CoV-2 viral copies this assay can detect is 138 copies/mL. A negative result does not preclude SARS-Cov-2 infection and should not be used as the sole basis for treatment or other patient management decisions. A negative result may occur with  improper specimen collection/handling, submission of specimen other than nasopharyngeal swab, presence of viral mutation(s) within the areas targeted by this assay, and inadequate number of viral copies(<138 copies/mL). A negative result must be combined  with clinical observations, patient history, and epidemiological information. The expected result is Negative.  Fact Sheet for Patients:  BloggerCourse.com  Fact Sheet for Healthcare Providers:  SeriousBroker.it  This test is no t yet approved or cleared by the [place] FDA and  has been authorized for detection and/or diagnosis of SARS-CoV-2 by FDA under an Emergency Use Authorization (EUA). This EUA will remain  in effect (meaning this test can be used) for the duration of the COVID-19 declaration under Section 564(b)(1) of the Act, 21 U.S.C.section 360bbb-3(b)(1), unless the authorization is terminated  or revoked sooner.       Influenza A by PCR NEGATIVE NEGATIVE Final   Influenza B by PCR NEGATIVE NEGATIVE Final    Comment: (NOTE) The Xpert Xpress SARS-CoV-2/FLU/RSV plus assay is intended as an aid in the diagnosis of influenza from Nasopharyngeal swab specimens and should not be used as a sole basis for treatment. Nasal washings and aspirates are unacceptable for Xpert Xpress SARS-CoV-2/FLU/RSV testing.  Fact Sheet for Patients: BloggerCourse.com  Fact Sheet for Healthcare Providers: SeriousBroker.it  This test is not yet approved or cleared by the [place] FDA and has been authorized for detection and/or diagnosis of SARS-CoV-2 by FDA under an Emergency Use Authorization (EUA). This EUA will remain in effect (meaning this test can be used) for the duration of the COVID-19 declaration under Section 564(b)(1) of the Act, 21 U.S.C. section 360bbb-3(b)(1), unless the authorization is terminated or revoked.  Performed at [facility], [address] [place], [place] [address]     Radiology Studies:  DG C-Arm 1-60 Min-No Report  Result Date: 04/20/2021 Fluoroscopy was utilized by the requesting physician.  No radiographic interpretation.   DG HIP  UNILAT WITH PELVIS 2-3 VIEWS LEFT  Result Date: 04/20/2021 CLINICAL DATA:  Fracture neck of left femur EXAM: DG HIP (WITH OR WITHOUT PELVIS) 2-3V LEFT COMPARISON:  04/19/2021 FINDINGS: Fluoroscopic assistance was provided for internal fixation of subcapital fracture of neck of left femur with 3 surgical screws. Fluoroscopic time was 71 seconds. Radiation dose is 10 mGy. IMPRESSION: Fluoroscopic assistance was provided for internal fixation of subcapital fracture of proximal left femur. Electronically Signed   By: Ernie Avena M.D.   On: 04/20/2021 16:32    Scheduled Meds:    acetaminophen  500 mg Oral Q6H   apixaban  2.5 mg Oral BID   cholecalciferol  1,000 Units Oral Daily   citalopram  10 mg Oral Daily   divalproex  250 mg Oral QHS   docusate sodium  100 mg Oral BID   donepezil  10 mg Oral Daily   feeding supplement   Oral BID   LORazepam  0.25 mg Oral BID   memantine  10 mg Oral BID   pantoprazole  40 mg  Oral Daily   senna  1 tablet Oral BID    Continuous Infusions:    methocarbamol (ROBAXIN) IV       LOS: 2 days     Marcellus ScottAnand Dakari Cregger, MD,  FACP, [facility] IncFHM, [facility], [facility] CenterCMPC (Care Management Physician Certified) [facility] [place]  To contact the attending provider between 7A-7P or the covering provider during after hours 7P-7A, please log into the web site [email or web address] and access using universal [place] password for that web site. If you do not have the password, please call the hospital operator.  04/21/2021, 4:34 PM

## 2021-04-21 NOTE — Progress Notes (Signed)
Mobility Specialist Criteria Algorithm Info.    04/21/21 1242  Mobility  Activity Transferred:  Chair to bed  Range of Motion/Exercises Passive  Level of Assistance +2 (takes two people)  Games developer wheel walker  Mobility Sit up in bed/chair position for meals  Mobility Response Tolerated well  Mobility performed by Mobility specialist; Family  Bed Position Semi-fowlers   Patient received in chair requesting assistance back to bed. Required max +2 assist and max cues to stand to stedy. Transferred to bed without incident. Was left in bed with all needs met and son at the bedside.   04/21/2021 12:43 PM  Rita Torres, [place], [place]  ZYSAY:[phone] Office: [phone]

## 2021-04-21 NOTE — Evaluation (Signed)
Physical Therapy Evaluation Patient Details Name: Rita Torres MRN: [id number] DOB: 1932/05/02 Today's Date: 04/21/2021  History of Present Illness  88 year old female who presented from nursing facility following an unwitnessed fall and staff found patient lying on her left side with facial trauma.  Admitted for fracture of floor of the left orbit and subcapital left femoral neck fracture with posterior lateral impaction.  ENT recommends conservative management for orbital fracture.  Pt now s/p left hip fracture pinning.  Pt with medical history significant for advanced dementia with behavioral disturbance, vitamin D deficiency, recent right subcapital femoral neck fracture s/p cannulated hip pinning on 02/12/2021 by Dr. Eulah PontMurphy.  Clinical Impression  Pt admitted with above diagnosis. PT and tech were able to assist pt to recliner with +2 max assist.  Pt confused and this limits her ability to participate with therapy. Pt with baseline dementia. Son feels that the A living can provide support pt needs when she is discharged.  Will follow acutely.  Pt currently with functional limitations due to the deficits listed below (see PT Problem List). Pt will benefit from skilled PT to increase their independence and safety with mobility to allow discharge to the venue listed below.           Recommendations for follow up therapy are one component of a multi-disciplinary discharge planning process, led by the attending physician.  Recommendations may be updated based on patient status, additional functional criteria and insurance authorization.  Follow Up Recommendations Home health PT (if the A living can provide incr care, if not may need short term SNF and then back to ALF)    Assistance Recommended at Discharge Frequent or constant Supervision/Assistance  Patient can return home with the following  A lot of help with walking and/or transfers;Two people to help with walking and/or transfers;A lot of  help with bathing/dressing/bathroom;Two people to help with bathing/dressing/bathroom;Assistance with cooking/housework;Assistance with feeding;Direct supervision/assist for medications management;Direct supervision/assist for financial management    Equipment Recommendations None recommended by PT  Recommendations for Other Services       Functional Status Assessment Patient has had a recent decline in their functional status and demonstrates the ability to make significant improvements in function in a reasonable and predictable amount of time.     Precautions / Restrictions Precautions Precautions: Fall Restrictions Weight Bearing Restrictions: Yes LLE Weight Bearing: Weight bearing as tolerated      Mobility  Bed Mobility Overal bed mobility: Needs Assistance Bed Mobility: Supine to Sit     Supine to sit: Mod assist;+2 for physical assistance;HOB elevated;Max assist     General bed mobility comments: Pt needed asist to move her LEs to EOB as well as her trunk. Pt resists movement at times and was hitting at therapist at tiems as well.    Transfers Overall transfer level: Needs assistance Equipment used: 2 person hand held assist Transfers: Sit to/from Stand;Bed to chair/wheelchair/BSC Sit to Stand: Max assist;+2 physical assistance;From elevated surface   Step pivot transfers: Max assist;+2 physical assistance;From elevated surface       General transfer comment: Pt needed max assist of 2 persons to stand with use of pad and gait belt.  Pt was able to bear weight on bil LEs for transfer.    Ambulation/Gait                  Stairs            Wheelchair Mobility    Modified Rankin (Stroke Patients Only)  Balance Overall balance assessment: Needs assistance Sitting-balance support: No upper extremity supported;Feet supported Sitting balance-Leahy Scale: Poor Sitting balance - Comments: Needed min to max assist to sit EOB as pt leaning  posteriorly and needed assist to lean anteriorly. Postural control: Posterior lean Standing balance support: Bilateral upper extremity supported;During functional activity Standing balance-Leahy Scale: Zero Standing balance comment: Pt could not stand fully upright but was able to bear weight on feet with +2 max assist                             Pertinent Vitals/Pain Pain Assessment: PAINAD Breathing: occasional labored breathing, short period of hyperventilation Negative Vocalization: occasional moan/groan, low speech, negative/disapproving quality Facial Expression: sad, frightened, frown Body Language: tense, distressed pacing, fidgeting Consolability: distracted or reassured by voice/touch PAINAD Score: 5 Pain Intervention(s): Limited activity within patient's tolerance;Monitored during session;Repositioned    Home Living Family/patient expects to be discharged to:: Assisted living                   Additional Comments: [facility] - they provide equipment pt will need per son    Prior Function Prior Level of Function : Needs assist  Cognitive Assist : Mobility (cognitive);ADLs (cognitive) Mobility (Cognitive): Step by step cues ADLs (Cognitive): Step by step cues Physical Assist : Mobility (physical);ADLs (physical) Mobility (physical): Bed mobility;Transfers ADLs (physical): Feeding;Grooming;Bathing;Dressing;Toileting Mobility Comments: Pt had been doing transfers to wheelchair with assist as well as sit to stands with therapy per son since recently broken hip. ADLs Comments: A by staff per son     Hand Dominance   Dominant Hand: Right    Extremity/Trunk Assessment   Upper Extremity Assessment Upper Extremity Assessment: Defer to OT evaluation    Lower Extremity Assessment Lower Extremity Assessment: LLE deficits/detail LLE: Unable to fully assess due to pain    Cervical / Trunk Assessment Cervical / Trunk Assessment: Kyphotic   Communication   Communication: No difficulties  Cognition Arousal/Alertness: Awake/alert Behavior During Therapy: Flat affect Overall Cognitive Status: History of cognitive impairments - at baseline Area of Impairment: Following commands;Safety/judgement;Problem solving;Orientation;Memory                 Orientation Level: Disoriented to;Place;Time;Situation   Memory: Decreased recall of precautions;Decreased short-term memory Following Commands: Follows one step commands inconsistently Safety/Judgement: Decreased awareness of safety;Decreased awareness of deficits   Problem Solving: Slow processing;Decreased initiation;Difficulty sequencing;Requires verbal cues;Requires tactile cues General Comments: Per son, hx of dementia Functional Status Assessment: Patient has had a recent decline in their functional status and demonstrates the ability to make significant improvements in function in a reasonable and predictable amount of time.      General Comments General comments (skin integrity, edema, etc.): Gave pt Busy posey material to occupy her and replaced her left mitt.  VSS.    Exercises General Exercises - Lower Extremity Heel Slides: AAROM;Left;5 reps;Supine   Assessment/Plan    PT Assessment Patient needs continued PT services  PT Problem List Decreased activity tolerance;Decreased range of motion;Decreased strength;Decreased balance;Decreased mobility;Decreased safety awareness;Decreased knowledge of use of DME;Decreased cognition;Decreased knowledge of precautions;Pain       PT Treatment Interventions DME instruction;Functional mobility training;Therapeutic activities;Therapeutic exercise;Balance training;Patient/family education;Wheelchair mobility training    PT Goals (Current goals can be found in the Care Plan section)  Acute Rehab PT Goals Patient Stated Goal: unable to state PT Goal Formulation: Patient unable to participate in goal setting Time For Goal  Achievement: 05/05/21 Potential to Achieve Goals: Fair    Frequency Min 3X/week     Co-evaluation               AM-PAC PT "6 Clicks" Mobility  Outcome Measure Help needed turning from your back to your side while in a flat bed without using bedrails?: Total Help needed moving from lying on your back to sitting on the side of a flat bed without using bedrails?: Total Help needed moving to and from a bed to a chair (including a wheelchair)?: Total Help needed standing up from a chair using your arms (e.g., wheelchair or bedside chair)?: Total Help needed to walk in hospital room?: Total Help needed climbing 3-5 steps with a railing? : Total 6 Click Score: 6    End of Session Equipment Utilized During Treatment: Gait belt Activity Tolerance: Patient limited by fatigue Patient left: in chair;with call bell/phone within reach;with chair alarm set;with family/visitor present Nurse Communication: Mobility status (use Stedy or Maximove to get pt back to bed if needed) PT Visit Diagnosis: Unsteadiness on feet (R26.81);Muscle weakness (generalized) (M62.81);Pain Pain - Right/Left: Left Pain - part of body: Hip    Time: 0932-6712 PT Time Calculation (min) (ACUTE ONLY): 23 min   Charges:   PT Evaluation $PT Eval Moderate Complexity: 1 Mod PT Treatments $Therapeutic Activity: 8-22 mins        August Longest M,PT Acute Rehab Services [phone] (pager)   Bevelyn Buckles 04/21/2021, 1:27 PM

## 2021-04-21 NOTE — Care Management Important Message (Signed)
Important Message  Patient Details  Name: Rita Torres MRN: [id number] Date of Birth: Mar 15, 1933   Medicare Important Message Given:  Yes     Sherilyn Banker 04/21/2021, 3:36 PM

## 2021-04-21 NOTE — Progress Notes (Deleted)
Physical Therapy Treatment Patient Details Name: Rita Torres MRN: [id number] DOB: 06-10-1932 Today's Date: 04/21/2021   History of Present Illness 88 year old female who presented from nursing facility following an unwitnessed fall and staff found patient lying on her left side with facial trauma.  Admitted for fracture of floor of the left orbit and subcapital left femoral neck fracture with posterior lateral impaction.  ENT recommends conservative management for orbital fracture.  Pt now s/p left hip fracture pinning.  Pt with medical history significant for advanced dementia with behavioral disturbance, vitamin D deficiency, recent right subcapital femoral neck fracture s/p cannulated hip pinning on 02/12/2021 by Dr. Percell Miller.    PT Comments    Pt admitted with above diagnosis. PT and tech were able to assist pt to recliner with +2 max assist.  Pt confused and this limits her ability to participate with therapy. Pt with baseline dementia. Son feels that the A living can provide support pt needs when she is discharged.  Will follow acutely.  Pt currently with functional limitations due to the deficits listed below (see PT Problem List). Pt will benefit from skilled PT to increase their independence and safety with mobility to allow discharge to the venue listed below.      Recommendations for follow up therapy are one component of a multi-disciplinary discharge planning process, led by the attending physician.  Recommendations may be updated based on patient status, additional functional criteria and insurance authorization.  Follow Up Recommendations  Home health PT (if the A living can provide incr care, if not may need short term SNF and then back to ALF)     Assistance Recommended at Discharge Frequent or constant Supervision/Assistance  Patient can return home with the following A lot of help with walking and/or transfers;Two people to help with walking and/or transfers;A lot of help with  bathing/dressing/bathroom;Two people to help with bathing/dressing/bathroom;Assistance with cooking/housework;Assistance with feeding;Direct supervision/assist for medications management;Direct supervision/assist for financial management   Equipment Recommendations  None recommended by PT    Recommendations for Other Services       Precautions / Restrictions Precautions Precautions: Fall Restrictions Weight Bearing Restrictions: Yes LLE Weight Bearing: Weight bearing as tolerated     Mobility  Bed Mobility Overal bed mobility: Needs Assistance Bed Mobility: Supine to Sit     Supine to sit: Mod assist;+2 for physical assistance;HOB elevated;Max assist     General bed mobility comments: Pt needed asist to move her LEs to EOB as well as her trunk. Pt resists movement at times and was hitting at therapist at tiems as well.    Transfers Overall transfer level: Needs assistance Equipment used: 2 person hand held assist Transfers: Sit to/from Stand;Bed to chair/wheelchair/BSC Sit to Stand: Max assist;+2 physical assistance;From elevated surface     Step pivot transfers: Max assist;+2 physical assistance;From elevated surface     General transfer comment: Pt needed max assist of 2 persons to stand with use of pad and gait belt.  Pt was able to bear weight on bil LEs for transfer.    Ambulation/Gait                   Stairs             Wheelchair Mobility    Modified Rankin (Stroke Patients Only)       Balance Overall balance assessment: Needs assistance Sitting-balance support: No upper extremity supported;Feet supported Sitting balance-Leahy Scale: Poor Sitting balance - Comments: Needed min to max assist  to sit EOB as pt leaning posteriorly and needed assist to lean anteriorly. Postural control: Posterior lean Standing balance support: Bilateral upper extremity supported;During functional activity Standing balance-Leahy Scale: Zero Standing balance  comment: Pt could not stand fully upright but was able to bear weight on feet with +2 max assist                            Cognition Arousal/Alertness: Awake/alert Behavior During Therapy: Flat affect Overall Cognitive Status: History of cognitive impairments - at baseline Area of Impairment: Following commands;Safety/judgement;Problem solving;Orientation;Memory                 Orientation Level: Disoriented to;Place;Time;Situation   Memory: Decreased recall of precautions;Decreased short-term memory Following Commands: Follows one step commands inconsistently Safety/Judgement: Decreased awareness of safety;Decreased awareness of deficits   Problem Solving: Slow processing;Decreased initiation;Difficulty sequencing;Requires verbal cues;Requires tactile cues General Comments: Per son, hx of dementia Functional Status Assessment: Patient has had a recent decline in their functional status and demonstrates the ability to make significant improvements in function in a reasonable and predictable amount of time.      Exercises General Exercises - Lower Extremity Heel Slides: AAROM;Left;5 reps;Supine    General Comments General comments (skin integrity, edema, etc.): Gave pt Busy posey material to occupy her and replaced her left mitt.  VSS.      Pertinent Vitals/Pain Pain Assessment: PAINAD Breathing: occasional labored breathing, short period of hyperventilation Negative Vocalization: occasional moan/groan, low speech, negative/disapproving quality Facial Expression: sad, frightened, frown Body Language: tense, distressed pacing, fidgeting Consolability: distracted or reassured by voice/touch PAINAD Score: 5 Pain Intervention(s): Limited activity within patient's tolerance;Monitored during session;Repositioned    Home Living Family/patient expects to be discharged to:: Assisted living                   Additional Comments: [facility] -  they provide equipment pt will need per son    Prior Function            PT Goals (current goals can now be found in the care plan section) Acute Rehab PT Goals Patient Stated Goal: unable to state PT Goal Formulation: Patient unable to participate in goal setting Time For Goal Achievement: 05/05/21 Potential to Achieve Goals: Fair    Frequency    Min 3X/week      PT Plan      Co-evaluation              AM-PAC PT "6 Clicks" Mobility   Outcome Measure  Help needed turning from your back to your side while in a flat bed without using bedrails?: Total Help needed moving from lying on your back to sitting on the side of a flat bed without using bedrails?: Total Help needed moving to and from a bed to a chair (including a wheelchair)?: Total Help needed standing up from a chair using your arms (e.g., wheelchair or bedside chair)?: Total Help needed to walk in hospital room?: Total Help needed climbing 3-5 steps with a railing? : Total 6 Click Score: 6    End of Session Equipment Utilized During Treatment: Gait belt Activity Tolerance: Patient limited by fatigue Patient left: in chair;with call bell/phone within reach;with chair alarm set;with family/visitor present Nurse Communication: Mobility status (use Stedy or Maximove to get pt back to bed if needed) PT Visit Diagnosis: Unsteadiness on feet (R26.81);Muscle weakness (generalized) (M62.81);Pain Pain - Right/Left: Left Pain - part  of body: Hip     Time: [id number] PT Time Calculation (min) (ACUTE ONLY): 23 min  Charges:  $Therapeutic Activity: 8-22 mins                     Mckennah Kretchmer M,PT Acute Rehab Services [phone] (pager)    Alvira Philips 04/21/2021, 1:00 PM

## 2021-04-21 NOTE — NC FL2 (Signed)
[place] LEVEL OF CARE SCREENING TOOL     IDENTIFICATION  Patient Name: Rita Torres Birthdate: 1932/04/28 Sex: female Admission Date (Current Location): 04/19/2021  [facility] and [place] Number:  Herbalist and Address:  [facility] [place] [place] [address] [place], [place] [address]      Provider Number: [id number]  Attending Physician Name and Address:  Modena Jansky, MD  Relative Name and Phone Number:  Laspina,(DIL)Lisa Other   [phone]    Current Level of Care: Hospital Recommended Level of Care: Memory Care Prior Approval Number:    Date Approved/Denied:   PASRR Number: [id number]  Discharge Plan: Other (Comment) (Memory Care)    Current Diagnoses: Patient Active Problem List   Diagnosis Date Noted   Closed left hip fracture ([place]) 04/19/2021   Macrocytic anemia 04/19/2021   Anxiety and depression 04/19/2021   Thrombocytopenia ([place]) 04/19/2021   Dehydration 04/19/2021   Closed fracture of left orbital floor ([place]) 04/19/2021   Fall 04/19/2021   Fracture of femoral neck, right, closed ([place]) 02/12/2021   Dementia with behavioral disturbance 02/12/2021   Generalized weakness 02/12/2021   Goals of care, counseling/discussion 02/12/2021   Pressure injury of skin 02/12/2021   Mild cognitive impairment 09/14/2016   OAB (overactive bladder) 11/16/2013   Osteopenia     Orientation RESPIRATION BLADDER Height & Weight     Self  O2 Incontinent Weight: 122 lb 9.2 oz (55.6 kg) Height:  5\' 5"  (165.1 cm)  BEHAVIORAL SYMPTOMS/MOOD NEUROLOGICAL BOWEL NUTRITION STATUS      Incontinent Diet (see discharge summary)  AMBULATORY STATUS COMMUNICATION OF NEEDS Skin   Total Care Verbally Surgical wounds                       Personal Care Assistance Level of Assistance  Bathing, Feeding, Dressing Bathing Assistance: Maximum assistance Feeding assistance: Limited assistance Dressing Assistance: Maximum assistance      Functional Limitations Info  Sight, Hearing, Speech Sight Info: Adequate Hearing Info: Adequate Speech Info: Adequate    SPECIAL CARE FACTORS FREQUENCY                       Contractures Contractures Info: Not present    Additional Factors Info  Code Status, Allergies Code Status Info: DNR Allergies Info: Penicillins           Current Medications (04/21/2021):  This is the current hospital active medication list Current Facility-Administered Medications  Medication Dose Route Frequency Provider Last Rate Last Admin   acetaminophen (TYLENOL) suppository 650 mg  650 mg Rectal Q6H PRN Modena Jansky, MD   650 mg at 04/20/21 2341   acetaminophen (TYLENOL) tablet 325-650 mg  325-650 mg Oral Q6H PRN Modena Jansky, MD       acetaminophen (TYLENOL) tablet 500 mg  500 mg Oral Q6H Gawne, Meghan M, PA-C       alum & mag hydroxide-simeth (MAALOX/MYLANTA) 200-200-20 MG/5ML suspension 30 mL  30 mL Oral Q4H PRN Gawne, Meghan M, PA-C       apixaban (ELIQUIS) tablet 5 mg  5 mg Oral BID Pham, Minh Q, RPH-CPP   5 mg at 04/21/21 1119   bisacodyl (DULCOLAX) suppository 10 mg  10 mg Rectal Daily PRN Gawne, Meghan M, PA-C       cholecalciferol (VITAMIN D3) tablet 1,000 Units  1,000 Units Oral Daily Gawne, Meghan M, PA-C       citalopram (CELEXA)  tablet 10 mg  10 mg Oral Daily Aggie Moats M, PA-C   10 mg at 04/21/21 1118   divalproex (DEPAKOTE ER) 24 hr tablet 250 mg  250 mg Oral QHS Gawne, Meghan M, PA-C       docusate sodium (COLACE) capsule 100 mg  100 mg Oral BID Gawne, Meghan M, PA-C       donepezil (ARICEPT) tablet 10 mg  10 mg Oral Daily Aggie Moats M, PA-C   10 mg at 04/21/21 1119   feeding supplement (ENSURE ENLIVE / ENSURE PLUS) liquid   Oral BID Gawne, Meghan M, PA-C       HYDROcodone-acetaminophen (NORCO/VICODIN) 5-325 MG per tablet 1 tablet  1 tablet Oral Q6H PRN Aggie Moats M, PA-C   1 tablet at 04/21/21 0800   LORazepam (ATIVAN) injection 0.25 mg  0.25 mg  Intravenous Q6H PRN Aggie Moats M, PA-C   0.25 mg at 04/19/21 1528   LORazepam (ATIVAN) tablet 0.25 mg  0.25 mg Oral BID Aggie Moats M, PA-C   0.25 mg at 04/21/21 1117   memantine (NAMENDA) tablet 10 mg  10 mg Oral BID Aggie Moats M, PA-C   10 mg at 04/21/21 1118   menthol-cetylpyridinium (CEPACOL) lozenge 3 mg  1 lozenge Oral PRN Aggie Moats M, PA-C       Or   phenol (CHLORASEPTIC) mouth spray 1 spray  1 spray Mouth/Throat PRN Gawne, Trinda Pascal M, PA-C       methocarbamol (ROBAXIN) tablet 500 mg  500 mg Oral Q6H PRN Gawne, Meghan M, PA-C       Or   methocarbamol (ROBAXIN) 500 mg in dextrose 5 % 50 mL IVPB  500 mg Intravenous Q6H PRN Gawne, Meghan M, PA-C       metoCLOPramide (REGLAN) tablet 5-10 mg  5-10 mg Oral Q8H PRN Gawne, Meghan M, PA-C       Or   metoCLOPramide (REGLAN) injection 5-10 mg  5-10 mg Intravenous Q8H PRN Gawne, Meghan M, PA-C       ondansetron (ZOFRAN) tablet 4 mg  4 mg Oral Q6H PRN Gawne, Meghan M, PA-C       Or   ondansetron (ZOFRAN) injection 4 mg  4 mg Intravenous Q6H PRN Gawne, Meghan M, PA-C       pantoprazole (PROTONIX) EC tablet 40 mg  40 mg Oral Daily Gawne, Meghan M, PA-C       polyethylene glycol (MIRALAX / GLYCOLAX) packet 17 g  17 g Oral Daily PRN Gawne, Meghan M, PA-C       senna (SENOKOT) tablet 8.6 mg  1 tablet Oral BID Gawne, Meghan M, PA-C       traMADol (ULTRAM) tablet 50 mg  50 mg Oral Q6H PRN Britt Bottom, PA-C         Discharge Medications: Please see discharge summary for a list of discharge medications.  Relevant Imaging Results:  Relevant Lab Results:   Additional Information SSN: [id number]  Joanne Chars, LCSW

## 2021-04-21 NOTE — Progress Notes (Addendum)
Pt is POD1 for hip pinning. Consult for apixaban stated ok to resume if hgb>8 on POD1. Hgb 9.2  Resume apixaban 2.5mg  BID for DVT prophylaxis.  Rx will follow peripherally  Ulyses Southward, PharmD, BCIDP, AAHIVP, CPP Infectious Disease Pharmacist 04/21/2021 8:11 AM

## 2021-04-21 NOTE — Progress Notes (Addendum)
[facility] 5N12 [facility]) Hospital Liaison Note   Received request from Transitions of Care Manager, Tammy Sours, for hospice services at home after discharge. Chart and patient information under review by [facility] physician. Hospice eligibility confirmed.   Spoke with DIL/Lisa to initiate education related to hospice philosophy, services, and team approach to care. DIL/Lisa verbalized understanding of information given. Per discussion, the plan is for patient to discharge home via PTAR once cleared to DC.    DME needs discussed. Patient has the following equipment in the home (Purchased privately): Walker Wheelchair Patient requests the following equipment for delivery: Hospital Bed  Address verified and is correct in the chart. DIL/Lisa is the family member to contact to arrange time of equipment delivery.    Please send signed and completed DNR home with patient/family. Please provide prescriptions at discharge as needed to ensure ongoing symptom management.    [facility] information and contact numbers given to family & above information shared with TOC.   Please call with any questions/concerns.    Thank you for the opportunity to participate in this patient's care.   Odette Fraction, MSW [facility] Liaison  [phone]

## 2021-04-21 NOTE — Evaluation (Signed)
Clinical/Bedside Swallow Evaluation Patient Details  Name: Rita Torres MRN: [id number] Date of Birth: [birth year]  Today's Date: 04/21/2021 Time: SLP Start Time (ACUTE ONLY): 1024 SLP Stop Time (ACUTE ONLY): 1057 SLP Time Calculation (min) (ACUTE ONLY): 33 min  Past Medical History:  Past Medical History:  Diagnosis Date   Dementia (HCC)    Dyspnea on exertion    Grief reaction    Memory loss    Osteopenia 04/2015   t score -1.8   Past Surgical History:  Past Surgical History:  Procedure Laterality Date   ABDOMINAL HYSTERECTOMY     BSO   HIP PINNING,CANNULATED Right 02/12/2021   Procedure: CANNULATED HIP PINNING;  Surgeon: Sheral Apley, MD;  Location: [facility];  Service: Orthopedics;  Laterality: Right;   OOPHORECTOMY     BSO   HPI:  88 year old female who presented from nursing facility following an unwitnessed fall and staff found patient lying on her left side with facial trauma.  Admitted for fracture of floor of the left orbit and subcapital left femoral neck fracture with posterior lateral impaction.  ENT recommends conservative management for orbital fracture.  Pt now s/p left hip fracture pinning.  Pt with medical history significant for advanced dementia with behavioral disturbance, vitamin D deficiency, recent right subcapital femoral neck fracture s/p cannulated hip pinning on 02/12/2021 by Dr. Eulah Pont.    Assessment / Plan / Recommendation  Clinical Impression  Pt presents with clinical indicators of pharyngeal dysphagia.  There was intermittent throat clearing with thin liquid.  There were no clincial s/s of aspiration with nectar thick liquid by serial straw sips in isolation and when used as liquid wash for regular solid graham cracker.  Pt exhibited good oral clearance of puree and solid texture.  Son Marlinda Mike reports no hx of dysphagia and that pt feeds herself independently at baseline. Pt has been agitated with RN today with difficulty accepting medications crushed  in puree, but is now quite calm.  SLP provided oral care with toothbrush to which pt was receptive.  Pt received pain medication this morning, and son reports some cognitive changes s/p anesthesia following recent R hip fx, which may impact her swallow function.  At present pt is not appropriate for further swallowing assessment.  If clinical s/s of aspiration persist with thin liquid, consider MBSS when pt is able to participate.    Recommend regular texture diet with nectar thick liquid at this time.  Pt will need 1:1 feeding assistance.   SLP Visit Diagnosis: Dysphagia, unspecified (R13.10)    Aspiration Risk  Mild aspiration risk    Diet Recommendation Regular;Nectar-thick liquid   Liquid Administration via: Cup;Straw Medication Administration:  (As tolerated.  Consider crushed or whole with puree.) Supervision: Staff to assist with self feeding Compensations: Slow rate;Small sips/bites Postural Changes: Seated upright at 90 degrees (as tolerated)    Other  Recommendations Oral Care Recommendations: Oral care BID    Recommendations for follow up therapy are one component of a multi-disciplinary discharge planning process, led by the attending physician.  Recommendations may be updated based on patient status, additional functional criteria and insurance authorization.  Follow up Recommendations Skilled nursing-short term rehab (<3 hours/day)      Assistance Recommended at Discharge Intermittent Supervision/Assistance  Functional Status Assessment Patient has had a recent decline in their functional status and demonstrates the ability to make significant improvements in function in a reasonable and predictable amount of time.  Frequency and Duration min 2x/week  2 weeks  Prognosis Prognosis for Safe Diet Advancement: Good      Swallow Study   General Date of Onset: 04/19/21 HPI: 88 year old female who presented from nursing facility following an unwitnessed fall and  staff found patient lying on her left side with facial trauma.  Admitted for fracture of floor of the left orbit and subcapital left femoral neck fracture with posterior lateral impaction.  ENT recommends conservative management for orbital fracture.  Pt now s/p left hip fracture pinning.  Pt with medical history significant for advanced dementia with behavioral disturbance, vitamin D deficiency, recent right subcapital femoral neck fracture s/p cannulated hip pinning on 02/12/2021 by Dr. Eulah Pont. Type of Study: Bedside Swallow Evaluation Previous Swallow Assessment: None Diet Prior to this Study: NPO Temperature Spikes Noted: No Respiratory Status: Room air History of Recent Intubation: Yes Length of Intubations (days):  (procedure only) Behavior/Cognition: Alert;Requires cueing Oral Cavity Assessment: Within Functional Limits Oral Care Completed by SLP: Yes Oral Cavity - Dentition: Adequate natural dentition Self-Feeding Abilities: Needs assist Patient Positioning: Upright in bed Baseline Vocal Quality: Low vocal intensity Volitional Cough: Cognitively unable to elicit Volitional Swallow: Unable to elicit    Oral/Motor/Sensory Function Overall Oral Motor/Sensory Function:  (Unable to assess 2/2 cognition) Facial Symmetry: Abnormal symmetry left (orbital swelling)   Ice Chips Ice chips: Not tested   Thin Liquid Thin Liquid: Impaired Presentation: Cup;Straw Pharyngeal  Phase Impairments: Throat Clearing - Immediate;Cough - Immediate    Nectar Thick Nectar Thick Liquid: Within functional limits Presentation: Straw   Honey Thick Honey Thick Liquid: Not tested   Puree Puree: Within functional limits Presentation: Spoon   Solid     Solid: Within functional limits Presentation: Spoon      Kerrie Pleasure, MA, CCC-SLP Acute Rehabilitation Services Office: [phone] 04/21/2021,11:11 AM

## 2021-04-21 NOTE — TOC Initial Note (Addendum)
Transition of Care [facility]) - Initial/Assessment Note    Patient Details  Name: Rita Torres MRN: [id number] Date of Birth: 10/04/1932  Transition of Care [facility]) CM/SW Contact:    Joanne Chars, LCSW Phone Number: 04/21/2021, 2:09 PM  Clinical Narrative:  CSW met with pt son Dallas in room regarding SNF vs PT at [facility] place/memory care.  Pt had gone to SNF last fall, they did not feel like it was a benefit.  He would like for pt to return to [facility] place, asked CSW to see if  Johnnette Barrios can provide any sort of PT.  CSW spoke with [place] at [facility].  Melissa was expecting pt to return with hospice services in place, including DME.  Said she had spoken with Loreli Slot' wife Lattie Haw about this.  CSW spoke with [place], he spoke with his wife and then confirmed that they do want to involve hospice at [facility] place.  Choice discussed, he would like to chose [facility].   MD informed, in agreement to make referral to [place].  CSW spoke with Haynes Dage at [facility] who will follow up.               Expected Discharge Plan: Home w Hospice Care Barriers to Discharge: Continued Medical Work up, Other (must enter comment) (pending hospice)   Patient Goals and CMS Choice        Expected Discharge Plan and Services Expected Discharge Plan: [place] In-house Referral: Clinical Social Work   Post Acute Care Choice: Hospice Living arrangements for the past 2 months: [place] ([place] care)                                      Prior Living Arrangements/Services Living arrangements for the past 2 months: [place] ([place] care) Lives with:: Facility Resident          Need for Family Participation in Patient Care: Yes (Comment) Care giver support system in place?: Yes (comment) Current home services: Other (comment) (none) Criminal Activity/Legal Involvement Pertinent to Current Situation/Hospitalization: No -  Comment as needed  Activities of Daily Living Home Assistive Devices/Equipment: Wheelchair ADL Screening (condition at time of admission) Patient's cognitive ability adequate to safely complete daily activities?: No Is the patient deaf or have difficulty hearing?: No Does the patient have difficulty seeing, even when wearing glasses/contacts?: No Does the patient have difficulty concentrating, remembering, or making decisions?: Yes Patient able to express need for assistance with ADLs?: No Does the patient have difficulty dressing or bathing?: Yes Independently performs ADLs?: No Communication: Dependent Is this a change from baseline?: Change from baseline, expected to last <3 days Dressing (OT): Dependent Is this a change from baseline?: Change from baseline, expected to last <3days Grooming: Needs assistance Is this a change from baseline?: Change from baseline, expected to last <3 days Feeding: Needs assistance Is this a change from baseline?: Change from baseline, expected to last <3 days Bathing: Dependent Is this a change from baseline?: Change from baseline, expected to last <3 days Toileting: Needs assistance Is this a change from baseline?: Change from baseline, expected to last <3 days In/Out Bed: Dependent, Independent with device (comment) Is this a change from baseline?: Change from baseline, expected to last <3 days Walks in Home: Dependent Is this a change from baseline?: Change from baseline, expected to last >3 days Does the patient have difficulty  walking or climbing stairs?: Yes Weakness of Legs: Both Weakness of Arms/Hands: Both  Permission Sought/Granted                  Emotional Assessment Appearance:: Appears stated age Attitude/Demeanor/Rapport: Unable to Assess Affect (typically observed): Unable to Assess Orientation: : Oriented to Self Alcohol / Substance Use: Not Applicable Psych Involvement: No (comment)  Admission diagnosis:  Closed left  hip fracture ([place]) [S72.002A] Closed fracture of left hip, initial encounter ([place]) [S72.002A] Closed fracture of left orbital floor, initial encounter [facility]) [S02.32XA] Patient Active Problem List   Diagnosis Date Noted   Closed left hip fracture ([place]) 04/19/2021   Macrocytic anemia 04/19/2021   Anxiety and depression 04/19/2021   Thrombocytopenia ([place]) 04/19/2021   Dehydration 04/19/2021   Closed fracture of left orbital floor ([place]) 04/19/2021   Fall 04/19/2021   Fracture of femoral neck, right, closed ([place]) 02/12/2021   Dementia with behavioral disturbance 02/12/2021   Generalized weakness 02/12/2021   Goals of care, counseling/discussion 02/12/2021   Pressure injury of skin 02/12/2021   Mild cognitive impairment 09/14/2016   OAB (overactive bladder) 11/16/2013   Osteopenia    PCP:  Lawerance Cruel, MD Pharmacy:   [id number] [place], [place] [address]Phone: [phone]:: [phone]    Social Determinants of Health (SDOH) Interventions    Readmission Risk Interventions No flowsheet data found.

## 2021-04-22 DIAGNOSIS — E860 Dehydration: Secondary | ICD-10-CM

## 2021-04-22 DIAGNOSIS — F03918 Unspecified dementia, unspecified severity, with other behavioral disturbance: Secondary | ICD-10-CM

## 2021-04-22 LAB — CBC
HCT: 31 % — ABNORMAL LOW (ref 36.0–46.0)
Hemoglobin: 9.5 g/dL — ABNORMAL LOW (ref 12.0–15.0)
MCH: 30.4 pg (ref 26.0–34.0)
MCHC: 30.6 g/dL (ref 30.0–36.0)
MCV: 99.4 fL (ref 80.0–100.0)
Platelets: 78 10*3/uL — ABNORMAL LOW (ref 150–400)
RBC: 3.12 MIL/uL — ABNORMAL LOW (ref 3.87–5.11)
RDW: 14.1 % (ref 11.5–15.5)
WBC: 7.2 10*3/uL (ref 4.0–10.5)
nRBC: 0 % (ref 0.0–0.2)

## 2021-04-22 MED ORDER — ACETAMINOPHEN 325 MG PO TABS: 650.0000 mg | ORAL_TABLET | Freq: Four times a day (QID) | ORAL | Status: AC | PRN

## 2021-04-22 MED ORDER — APIXABAN 2.5 MG PO TABS: 2.5000 mg | ORAL_TABLET | Freq: Two times a day (BID) | ORAL | 0 refills | Status: AC

## 2021-04-22 NOTE — TOC Progression Note (Signed)
Transition of Care [facility]) - Progression Note    Patient Details  Name: MILEVA KILDOW MRN: [id number] Date of Birth: 15-May-1932  Transition of Care [facility]) CM/SW Contact  Marks Scalera, [place], [place] Phone Number: 04/22/2021, 2:11 PM  Clinical Narrative:    Phone call to [facility] to check status of hospital bed delivery-confirmed that it is still pending.   Graeden Bitner [address], [place] Transition of Care [phone]    Expected Discharge Plan: Home w Hospice Care Barriers to Discharge: Continued Medical Work up, Other (must enter comment) (pending hospice)  Expected Discharge Plan and Services Expected Discharge Plan: [place] In-house Referral: Clinical Social Work   Post Acute Care Choice: Hospice Living arrangements for the past 2 months: [place] ([place] care)                                       Social Determinants of Health (SDOH) Interventions    Readmission Risk Interventions No flowsheet data found.

## 2021-04-22 NOTE — Progress Notes (Signed)
Report given to Med tech at [facility]

## 2021-04-22 NOTE — Progress Notes (Signed)
Speech Language Pathology Treatment: Dysphagia  Patient Details Name: Rita Torres MRN: [id number] DOB: 17-May-1932 Today's Date: 04/22/2021 Time: [id number] SLP Time Calculation (min) (ACUTE ONLY): 12 min  Assessment / Plan / Recommendation Clinical Impression  Pt was seen for skilled ST targeting diet tolerance and thin liquid trials.  Pt was encountered asleep in bed, and she roused to moderate verbal and tactile stimulation.  She appeared to be confused throughout this session and required cueing to follow directions.  Pt consumed trials of thin liquid and nectar-thick liquid via straw.  She refused solid trials on this date.  Pt required assistance for all PO intake.  Pt initially tolerated trials of thin liquid without overt difficulty; however, as trials progressed, she appeared to fatigue and clinical s/sx of aspiration were noted, including delayed throat clearing, multiple swallows per bolus, and watery eyes.  No overt s/sx of aspiration were observed with nectar-thick liquids.  Recommend continuation of regular solids and nectar-thick liquids with medication administered crushed in puree.  SLP will f/u to monitor diet tolerance and advance liquids as able.  If clinical s/sx of aspiration persist, pt may benefit from an instrumental swallow evaluation to further evaluate swallow function.    HPI HPI: 88 year old female who presented from nursing facility following an unwitnessed fall and staff found patient lying on her left side with facial trauma.  Admitted for fracture of floor of the left orbit and subcapital left femoral neck fracture with posterior lateral impaction.  ENT recommends conservative management for orbital fracture.  Pt now s/p left hip fracture pinning.  Pt with medical history significant for advanced dementia with behavioral disturbance, vitamin D deficiency, recent right subcapital femoral neck fracture s/p cannulated hip pinning on 02/12/2021 by Dr. Percell Miller.      SLP  Plan  Continue with current plan of care      Recommendations for follow up therapy are one component of a multi-disciplinary discharge planning process, led by the attending physician.  Recommendations may be updated based on patient status, additional functional criteria and insurance authorization.    Recommendations  Diet recommendations: Regular;Nectar-thick liquid Liquids provided via: Cup;Straw Medication Administration: Crushed with puree Supervision: Staff to assist with self feeding;Full supervision/cueing for compensatory strategies Compensations: Slow rate;Small sips/bites Postural Changes and/or Swallow Maneuvers: Seated upright 90 degrees                Oral Care Recommendations: Oral care BID;Staff/trained caregiver to provide oral care Follow Up Recommendations: Skilled nursing-short term rehab (<3 hours/day) Assistance recommended at discharge: Frequent or constant Supervision/Assistance SLP Visit Diagnosis: Dysphagia, unspecified (R13.10) Plan: Continue with current plan of care          Bretta Bang, M.S., [place] Office: [phone]  [place]  04/22/2021, 1:27 PM

## 2021-04-22 NOTE — TOC Progression Note (Signed)
Transition of Care [facility]) - Progression Note    Patient Details  Name: Rita Torres MRN: [id number] Date of Birth: [birth year]  Transition of Care [facility]) CM/SW Contact  Verna Czech [place], [place] Phone Number: [phone] 04/22/2021, 1:01 PM  Clinical Narrative:    Phone call to [facility] to confirm that patient has been approved for hospice services at [facility].    [employer] contacted, spoke with Librarian, academic Melissa who stated that patient  can return, however they are waiting for her DME equipment to be delivered before she returns. She will call this social worker back once hospital bed has been delivered.  Transition of Care to continue to follow for discharge needs  [facility], [place] Transition of Care [phone]     Expected Discharge Plan: Home w Hospice Care Barriers to Discharge: Continued Medical Work up, Other (must enter comment) (pending hospice)  Expected Discharge Plan and Services Expected Discharge Plan: Home w Hospice Care In-house Referral: Clinical Social Work   Post Acute Care Choice: Hospice Living arrangements for the past 2 months: Assisted Living Facility [facility] Memory care)                                       Social Determinants of Health (SDOH) Interventions    Readmission Risk Interventions No flowsheet data found.

## 2021-04-22 NOTE — Discharge Instructions (Signed)

## 2021-04-22 NOTE — TOC Progression Note (Signed)
Transition of Care [facility]) - Progression Note    Patient Details  Name: Rita Torres MRN: [id number] Date of Birth: 1932/04/27  Transition of Care [facility]) CM/SW Contact  [address], [place], [place] Phone Number: 04/22/2021, 4:42 PM  Clinical Narrative:    Patient to return to [facility] with [facility],  DME equipment delivered. [facility] contacted for transport.  Shalen Petrak [address] [place] Transition of Care [phone]    Expected Discharge Plan: Home w Hospice Care Barriers to Discharge: Continued Medical Work up, Other (must enter comment) (pending hospice)  Expected Discharge Plan and Services Expected Discharge Plan: [place] In-house Referral: Clinical Social Work   Post Acute Care Choice: Hospice Living arrangements for the past 2 months: [place] ([place] care) Expected Discharge Date: 04/22/21                                     Social Determinants of Health (SDOH) Interventions    Readmission Risk Interventions No flowsheet data found.

## 2021-04-22 NOTE — Progress Notes (Signed)
PROGRESS NOTE   Rita Torres  ZOX:[id number]    DOB: 1932/08/05    DOA: 04/19/2021  PCP: Daisy Florooss, Charles Alan, MD   I have briefly reviewed patients [facility].  Chief Complaint  Patient presents with   Fall    Brief Narrative:  88 year old female with medical history significant for advanced dementia with behavioral disturbance, vitamin D deficiency, recent right subcapital femoral neck fracture s/p cannulated hip pinning on 02/12/2021 by Dr. Eulah PontMurphy, presented from nursing facility following an unwitnessed fall and staff found patient lying on her left side with facial trauma.  Admitted for fracture of floor of the left orbit and subcapital left femoral neck fracture with posterior lateral impaction.  ENT recommends conservative management for orbital fracture.  Orthopedics consulted and s/p left cannulated hip pinning 1/12.  Family has now opted to discharge patient back to prior memory care unit with hospice.  Medically optimized for discharge.  Has been accepted for hospice.  Awaiting DME to be delivered to her facility prior to discharge.  TOC on board.   Assessment & Plan:  Principal Problem:   Closed left hip fracture (HCC) Active Problems:   Dementia with behavioral disturbance   Macrocytic anemia   Anxiety and depression   Thrombocytopenia (HCC)   Dehydration   Closed fracture of left orbital floor [facility](HCC)   Fall   Closed left subcapital femoral neck fracture, secondary to unwitnessed fall at nursing home: S/p Tdap booster in ED.  Orthopedics consultation appreciated and s/p cannulated hip pinning on 1/12.  Multimodality pain control.  Minimize opioids as much as possible.  Delirium precautions.  Pain seems to be adequately controlled.  Per orthopedics, WBAT LLE, dressings to be left intact until outpatient follow-up, now on Eliquis 2.5 Mg twice daily for postop DVT prophylaxis.  Outpatient follow-up with orthopedics versus telehealth follow-up  in 2 weeks.  She has been cleared for DC from orthopedic standpoint.  Closed left orbital floor fracture: Also due to unwitnessed fall at nursing facility.  Imaging showed layering blood in the left maxillary sinus.  ENT/Dr. Elijah Birkaldwell input appreciated and advises that given her comorbidities, use of platelet inhibitors and absence of ocular entrapment on exam and scan, this does not need to be fixed acutely and suspects that this will heal without needing anything done.  He recommends reevaluation by ENT in 1 week particularly if having any trouble with diplopia or pain with eye movement.  Advanced dementia with behavioral disturbance: As per H&P, at baseline patient can feed herself and at the memory care unit where she had been getting around with the use of wheelchair and even standing.  Delirium precautions.  Minimize sedative/opioids as much as possible.  Continue home meds including Depakote, Aricept and Namenda.  Dehydration: Holding furosemide due to soft blood pressures on admission.  Briefly on IV fluids, now discontinued.  Macrocytic anemia: Baseline hemoglobin probably in the 9 g range.  Hemoglobin at baseline.  Follow CBC daily.  Vitamin B12 247 in November 2022.  Consider B12 supplements.  Hemoglobin remains stable.  Thrombocytopenia: Platelet counts in November 2022 were ranging in the 110s-140s.  Now presented with platelet counts in the 80s, stable since yesterday.  Follow daily CBC.  Platelet count relatively stable.  Anxiety and depression: Continue home regimen including Celexa and Ativan when able to tolerate p.o.  History of right hip fracture: Underwent pinning by Dr. Eulah PontMurphy 02/2021.  Reportedly placed on Eliquis postprocedure-should have completed 30-day course so  unsure why she was still on it.  Now on reduced dose Eliquis for current surgical postop DVT prophylaxis.  Dysphagia: Speech therapy has evaluated and recommend regular diet and nectar thickened liquids.  Appetite  has been poor.  Unsure how much she is taking.  She needs to be fed.  Adult failure to thrive: Multifactorial due to very advanced age, severe dementia, frailty and medical problems.  Family has appropriately decided to transition her to hospice at current facility.  Plan for DC to prior memory care unit pending delivery of DME/hospital bed.  Body mass index is 19.63 kg/m.    DVT prophylaxis: apixaban (ELIQUIS) tablet 2.5 mg Start: 04/21/21 2200 SCDs Start: 04/20/21 1629     Code Status: DNR Family Communication: None at bedside. Disposition:  Status is: Inpatient  Remains inpatient appropriate because: DC to prior memory care unit once hospice DME have been delivered.        Consultants:   Orthopedics  Procedures:   S/p cannulated left hip fracture pinning on 1/12.  Antimicrobials:   None  Subjective:  Patient nonverbal.  No family in room.  Appears to be resting comfortably and did not appear in any pain or distress.  Objective:   Vitals:   04/22/21 0436 04/22/21 0500 04/22/21 0615 04/22/21 0759  BP: (!) 175/87  (!) 149/93 (!) 143/110  Pulse: 70  76 (!) 42  Resp:   20 18  Temp: 98.3 F (36.8 C)  98.2 F (36.8 C) 99.2 F (37.3 C)  TempSrc: Oral  Oral Oral  SpO2: 94%  94% 93%  Weight:  53.5 kg    Height:        General exam: Elderly female, small built, frail, chronically ill looking, sitting up in bed without discomfort. HEENT: Left periorbital ecchymosis.  Left lateral supraorbital area laceration with sutures intact.  Left eye without subconjunctival hemorrhage and pupil reacting to light. Respiratory system: Poor inspiratory effort but appears clear to auscultation.  No increased work of breathing. Cardiovascular system: S1 & S2 heard, RRR. No JVD, murmurs, rubs, gallops or clicks. No pedal edema.  Telemetry personally reviewed: Sinus rhythm, DC'd telemetry 1/13 Gastrointestinal system: Abdomen is nondistended, soft and nontender. No organomegaly or masses  felt. Normal bowel sounds heard. Central nervous system: Eyes closed, at times mumbles incomprehensibly, cannot assess alertness.  Not oriented. No focal neurological deficits. Extremities: Symmetric 5 x 5 power in upper extremities.  Left hip surgical site dressing clean and dry. Skin: No rashes, lesions or ulcers Psychiatry: Judgement and insight impaired. Mood & affect cannot be assessed.     Data Reviewed:   I have personally reviewed following labs and imaging studies   CBC: Recent Labs  Lab 04/19/21 0241 04/20/21 0338 04/21/21 0243 04/22/21 0030  WBC 7.4 6.5 6.9 7.2  NEUTROABS 6.4  --   --   --   HGB 10.3* 9.8* 9.2* 9.5*  HCT 35.5* 31.0* 29.8* 31.0*  MCV 102.9* 97.8 98.7 99.4  PLT 89* 81* 71* 78*    Basic Metabolic Panel: Recent Labs  Lab 04/19/21 0241 04/20/21 0338 04/21/21 0243  NA 140 140 139  K 4.2 3.8 4.0  CL 106 109 109  CO2 26 24 24   GLUCOSE 123* 94 120*  BUN 35* 27* 29*  CREATININE 0.76 0.83 0.74  CALCIUM 8.9 8.4* 7.9*    Liver Function Tests: No results for input(s): AST, ALT, ALKPHOS, BILITOT, PROT, ALBUMIN in the last 168 hours.  CBG: Recent Labs  Lab 04/19/21 2100  04/20/21 0655  GLUCAP 100* 88    Microbiology Studies:   Recent Results (from the past 240 hour(s))  Resp Panel by RT-PCR (Flu A&B, Covid) Nasopharyngeal Swab     Status: None   Collection Time: 04/19/21  6:08 AM   Specimen: Nasopharyngeal Swab; Nasopharyngeal(NP) swabs in vial transport medium  Result Value Ref Range Status   SARS Coronavirus 2 by RT PCR NEGATIVE NEGATIVE Final    Comment: (NOTE) SARS-CoV-2 target nucleic acids are NOT DETECTED.  The SARS-CoV-2 RNA is generally detectable in upper respiratory specimens during the acute phase of infection. The lowest concentration of SARS-CoV-2 viral copies this assay can detect is 138 copies/mL. A negative result does not preclude SARS-Cov-2 infection and should not be used as the sole basis for treatment or other  patient management decisions. A negative result may occur with  improper specimen collection/handling, submission of specimen other than nasopharyngeal swab, presence of viral mutation(s) within the areas targeted by this assay, and inadequate number of viral copies(<138 copies/mL). A negative result must be combined with clinical observations, patient history, and epidemiological information. The expected result is Negative.  Fact Sheet for Patients:  BloggerCourse.comhttps://www.fda.gov/media/152166/download  Fact Sheet for Healthcare Providers:  SeriousBroker.ithttps://www.fda.gov/media/152162/download  This test is no t yet approved or cleared by the [place] FDA and  has been authorized for detection and/or diagnosis of SARS-CoV-2 by FDA under an Emergency Use Authorization (EUA). This EUA will remain  in effect (meaning this test can be used) for the duration of the COVID-19 declaration under Section 564(b)(1) of the Act, 21 U.S.C.section 360bbb-3(b)(1), unless the authorization is terminated  or revoked sooner.       Influenza A by PCR NEGATIVE NEGATIVE Final   Influenza B by PCR NEGATIVE NEGATIVE Final    Comment: (NOTE) The Xpert Xpress SARS-CoV-2/FLU/RSV plus assay is intended as an aid in the diagnosis of influenza from Nasopharyngeal swab specimens and should not be used as a sole basis for treatment. Nasal washings and aspirates are unacceptable for Xpert Xpress SARS-CoV-2/FLU/RSV testing.  Fact Sheet for Patients: BloggerCourse.comhttps://www.fda.gov/media/152166/download  Fact Sheet for Healthcare Providers: SeriousBroker.ithttps://www.fda.gov/media/152162/download  This test is not yet approved or cleared by the [place] FDA and has been authorized for detection and/or diagnosis of SARS-CoV-2 by FDA under an Emergency Use Authorization (EUA). This EUA will remain in effect (meaning this test can be used) for the duration of the COVID-19 declaration under Section 564(b)(1) of the Act, 21 U.S.C. section  360bbb-3(b)(1), unless the authorization is terminated or revoked.  Performed at [facility], [address] [place], [place] [address]     Radiology Studies:  DG C-Arm 1-60 Min-No Report  Result Date: 04/20/2021 Fluoroscopy was utilized by the requesting physician.  No radiographic interpretation.   DG HIP UNILAT WITH PELVIS 2-3 VIEWS LEFT  Result Date: 04/20/2021 CLINICAL DATA:  Fracture neck of left femur EXAM: DG HIP (WITH OR WITHOUT PELVIS) 2-3V LEFT COMPARISON:  04/19/2021 FINDINGS: Fluoroscopic assistance was provided for internal fixation of subcapital fracture of neck of left femur with 3 surgical screws. Fluoroscopic time was 71 seconds. Radiation dose is 10 mGy. IMPRESSION: Fluoroscopic assistance was provided for internal fixation of subcapital fracture of proximal left femur. Electronically Signed   By: Ernie AvenaPalani  Rathinasamy M.D.   On: 04/20/2021 16:32    Scheduled Meds:    apixaban  2.5 mg Oral BID   cholecalciferol  1,000 Units Oral Daily   citalopram  10 mg Oral Daily   divalproex  250 mg Oral QHS   docusate sodium  100 mg Oral BID   donepezil  10 mg Oral Daily   feeding supplement   Oral BID   LORazepam  0.25 mg Oral BID   memantine  10 mg Oral BID   pantoprazole  40 mg Oral Daily   senna  1 tablet Oral BID    Continuous Infusions:    methocarbamol (ROBAXIN) IV       LOS: 3 days     Marcellus Scott, MD,  FACP, [facility], [facility], [facility] (Care Management Physician Certified) [facility] [place]  To contact the attending provider between 7A-7P or the covering provider during after hours 7P-7A, please log into the web site [email or web address] and access using universal Pink password for that web site. If you do not have the password, please call the hospital operator.  04/22/2021, 3:04 PM

## 2021-04-22 NOTE — Progress Notes (Signed)
Patient Rita Torres to [facility] via [facility]. Facility contacted with ETA.

## 2021-04-22 NOTE — Discharge Summary (Signed)
Physician Discharge Summary  Rita Torres YNW:[id number] DOB: 1932-08-12  PCP: Daisy Florooss, Charles Alan, MD  [facility].   Discharged to: [facility], with hospice.  Admit date: 04/19/2021 Discharge date: 04/22/2021  Recommendations for Outpatient Follow-up:    Follow-up Information     Sheral ApleyMurphy, Timothy D, MD. Schedule an appointment as soon as possible for a visit in 2 week(s).   Specialty: Orthopedic Surgery Why: Postop follow-up. Contact information: [address] [place] [phone] [phone]         Daisy Florooss, Charles Alan, MD. Schedule an appointment as soon as possible for a visit in 1 week(s).   Specialty: Family Medicine Why: With repeat labs (CBC & BMP), provided family wish to pursue. Contact information: [address] [place] [address] [phone]         Wendall StadeNishan, Peter C, MD .   Specialty: Cardiology Contact information: [phone] [address] [place] [address] [phone]                  Home Health: None    Equipment/Devices: Per hospice services.    Discharge Condition: Improved and stable.  Overall prognosis is quite poor.   Code Status: DNR Diet recommendation:  Discharge Diet Orders (From admission, onward)     Start     Ordered   04/22/21 0000  Diet general       Comments: Regular consistency diet and nectar thickened liquids.   04/22/21 1613             Discharge Diagnoses:  Principal Problem:   Closed left hip fracture (HCC) Active Problems:   Dementia with behavioral disturbance   Macrocytic anemia   Anxiety and depression   Thrombocytopenia (HCC)   Dehydration   Closed fracture of left orbital floor [facility](HCC)   Fall   Brief Summary: 88 year [name] with medical history significant for advanced dementia with behavioral disturbance, vitamin D deficiency, recent right subcapital femoral neck fracture s/p  cannulated hip pinning on 02/12/2021 by Dr. Eulah PontMurphy, presented from nursing facility following an unwitnessed fall and staff found patient lying on her left side with facial trauma.  Admitted for fracture of floor of the left orbit and subcapital left femoral neck fracture with posterior lateral impaction.  ENT recommends conservative management for orbital fracture.  Orthopedics consulted and s/p left cannulated hip pinning 1/12.  Family has now opted to discharge patient back to prior memory care unit with hospice.     Assessment & Plan:   Closed left subcapital femoral neck fracture, secondary to unwitnessed fall at nursing home: S/p Tdap booster in ED.  Orthopedics consultation appreciated and s/p cannulated hip pinning on 1/12.  Multimodality pain control.  Minimize opioids as much as possible.  Delirium precautions.  Pain seems to be adequately controlled.  Per orthopedics, WBAT LLE, dressings to be left intact until outpatient follow-up, now on Eliquis 2.5 Mg twice daily x30 days for postop DVT prophylaxis.  Outpatient follow-up with orthopedics versus telehealth follow-up in 2 weeks.  She has been cleared for DC from orthopedic standpoint.  Closed left orbital floor fracture: Also due to unwitnessed fall at nursing facility.  Imaging showed layering blood in the left maxillary sinus.  ENT/Dr. Elijah Birkaldwell input appreciated and advises that given her comorbidities, use of platelet inhibitors and absence of ocular entrapment on exam and scan, this does not need to be fixed acutely and suspects that this will  heal without needing anything done.  He recommends reevaluation by ENT in 1 week particularly if having any trouble with diplopia or pain with eye movement: This can be pursued if family wish to pursue any form of aggressive evaluation and management.   Advanced dementia with behavioral disturbance: As per H&P, at baseline patient can feed herself and at the memory care unit where she had been getting  around with the use of wheelchair and even standing.  Delirium precautions.  Minimize sedative/opioids as much as possible.  Continue home meds including Depakote, Aricept and Namenda.   Dehydration: Resolved after brief IV fluids.  Remains at high risk for recurrent dehydration due to inconsistent and poor oral intake.  Unsure why she was on Lasix PTA, could consider stopping this at prior memory care unit.  This can be pursued by her PCP   Macrocytic anemia: Baseline hemoglobin probably in the 9 g range.  Hemoglobin at baseline.  Follow CBC daily.  Vitamin B12 247 in November 2022.  Consider B12 supplements.  Hemoglobin remains stable.  Thrombocytopenia: Platelet counts in November 2022 were ranging in the 110s-140s.  Now presented with platelet counts in the 80s, stable since yesterday.  Follow daily CBC.  Platelet count relatively stable.  Anxiety and depression: Continue home regimen including Celexa and Klonopin.  History of right hip fracture: Underwent pinning by Dr. Eulah Pont 02/2021.  Reportedly placed on Eliquis postprocedure-should have completed 30-day course so unsure why she was still on it.  Now on reduced dose Eliquis for current surgical postop DVT prophylaxis.   Dysphagia: Speech therapy has evaluated and recommend regular diet and nectar thickened liquids.  Appetite has been poor.  Unsure how much she is taking.  She needs to be fed.   Adult failure to thrive: Multifactorial due to very advanced age, severe dementia, frailty and medical problems.  Family has appropriately decided to transition her to hospice at current facility.  Plan for DC to prior memory care unit with hospice.  Body mass index is 19.63 kg/m.      Consultants:   Orthopedics   Procedures:   S/p cannulated left hip fracture pinning on 1/12.   Discharge Instructions  Discharge Instructions     Call MD for:  difficulty breathing, headache or visual disturbances   Complete by: As directed    Call MD  for:  extreme fatigue   Complete by: As directed    Call MD for:  persistant dizziness or light-headedness   Complete by: As directed    Call MD for:  persistant nausea and vomiting   Complete by: As directed    Call MD for:  redness, tenderness, or signs of infection (pain, swelling, redness, odor or green/yellow discharge around incision site)   Complete by: As directed    Call MD for:  severe uncontrolled pain   Complete by: As directed    Call MD for:  temperature >100.4   Complete by: As directed    Diet general   Complete by: As directed    Regular consistency diet and nectar thickened liquids.   Increase activity slowly   Complete by: As directed    No wound care   Complete by: As directed         Medication List     STOP taking these medications    loperamide 2 MG capsule Commonly known as: IMODIUM   polyethylene glycol 17 g packet Commonly known as: MIRALAX / GLYCOLAX  TAKE these medications    acetaminophen 325 MG tablet Commonly known as: TYLENOL Take 2 tablets (650 mg total) by mouth every 6 (six) hours as needed for mild pain. What changed:  when to take this reasons to take this   apixaban 2.5 MG Tabs tablet Commonly known as: Eliquis Take 1 tablet (2.5 mg total) by mouth 2 (two) times daily. To prevent blood clots after surgery What changed:  medication strength how much to take additional instructions   citalopram 10 MG tablet Commonly known as: CeleXA Take 1 tablet (10 mg total) by mouth daily.   divalproex 250 MG 24 hr tablet Commonly known as: DEPAKOTE ER TAKE 1 TABLET BY MOUTH EVERY DAY AT NIGHT What changed:  how much to take how to take this when to take this additional instructions   docusate sodium 100 MG capsule Commonly known as: COLACE Take 1 capsule (100 mg total) by mouth 2 (two) times daily.   donepezil 10 MG tablet Commonly known as: Aricept Take 1 tablet every night What changed:  how much to take how to  take this when to take this   ENSURE ORIGINAL PO Take 237 mLs by mouth in the morning and at bedtime. vanilla What changed: Another medication with the same name was removed. Continue taking this medication, and follow the directions you see here.   furosemide 20 MG tablet Commonly known as: LASIX Take 10 mg by mouth daily.   LORazepam 0.5 MG tablet Commonly known as: ATIVAN Take 0.25 mg by mouth 2 (two) times daily.   memantine 10 MG tablet Commonly known as: NAMENDA 1 TABLET TWICE A DAY What changed:  how much to take how to take this when to take this additional instructions   multivitamin with minerals Tabs tablet Take 1 tablet by mouth daily.   senna 8.6 MG Tabs tablet Commonly known as: SENOKOT Take 1 tablet (8.6 mg total) by mouth 2 (two) times daily.   traMADol 50 MG tablet Commonly known as: Ultram Take 1 tablet (50 mg total) by mouth every 6 (six) hours as needed for moderate pain or severe pain.   Vitamin D3 25 MCG tablet Commonly known as: Vitamin D Take 1 tablet (1,000 Units total) by mouth daily.       Allergies  Allergen Reactions   Penicillins Rash    Tolerated Ancef. Did it involve swelling of the face/tongue/throat, SOB, or low BP? N Did it involve sudden or severe rash/hives, skin peeling, or any reaction on the inside of your mouth or nose? N Did you need to seek medical attention at a hospital or doctor's office? N When did it last happen?       If all above answers are "NO", may proceed with cephalosporin use.       Procedures/Studies: CT HEAD WO CONTRAST ( )  Result Date: 04/19/2021 CLINICAL DATA:  Head trauma, minor (Age >= 65y) Head trauma, moderate-severe EXAM: CT HEAD WITHOUT CONTRAST TECHNIQUE: Contiguous axial images were obtained from the base of the skull through the vertex without intravenous contrast. RADIATION DOSE REDUCTION: This exam was performed according to the departmental dose-optimization program which includes  automated exposure control, adjustment of the mA and/or kV according to patient size and/or use of iterative reconstruction technique. COMPARISON:  02/11/2021 FINDINGS: Brain: There is atrophy and chronic small vessel disease changes. No acute intracranial abnormality. Specifically, no hemorrhage, hydrocephalus, mass lesion, acute infarction, or significant intracranial injury. Vascular: No hyperdense vessel or unexpected calcification. Skull: No acute  calvarial abnormality. Sinuses/Orbits: Air-fluid level in the left maxillary sinus. Fracture through the floor of the left orbit seen on coronal imaging. Mildly depressed fracture fragments. No evidence of entrapment. Other: Soft tissue swelling over the left orbit. IMPRESSION: Atrophy, chronic microvascular disease. No acute intracranial abnormality. Fracture through the floor the left orbit. Mildly depressed fracture fragments without evidence of entrapment. Overlying soft tissue swelling. Electronically Signed   By: Charlett NoseKevin  Dover M.D.   On: 04/19/2021 03:09   CT Cervical Spine Wo Contrast  Result Date: 04/19/2021 CLINICAL DATA:  Neck trauma (Age >= 65y) EXAM: CT CERVICAL SPINE WITHOUT CONTRAST TECHNIQUE: Multidetector CT imaging of the cervical spine was performed without intravenous contrast. Multiplanar CT image reconstructions were also generated. RADIATION DOSE REDUCTION: This exam was performed according to the departmental dose-optimization program which includes automated exposure control, adjustment of the mA and/or kV according to patient size and/or use of iterative reconstruction technique. COMPARISON:  02/11/2021 FINDINGS: Alignment: Normal Skull base and vertebrae: No acute fracture. No primary bone lesion or focal pathologic process. Soft tissues and spinal canal: No prevertebral fluid or swelling. No visible canal hematoma. Disc levels:  Diffuse degenerative disc disease and facet disease. Upper chest: No acute findings Other: Left orbital floor  fracture again noted as seen on head CT with blood layering in the left maxillary sinus. IMPRESSION: No acute bony abnormality in the cervical spine. Left orbital floor fracture with layering blood in the left maxillary sinus. Electronically Signed   By: Charlett NoseKevin  Dover M.D.   On: 04/19/2021 03:11   DG Pelvis Portable  Result Date: 04/19/2021 CLINICAL DATA:  Fall with EXAM: PORTABLE PELVIS 1-2 VIEWS COMPARISON:  Right hip radiograph dated 02/11/2021 and pelvic radiograph dated 03/22/2020. FINDINGS: Evaluation is very limited due to positioning. Prior fixation screw of the right femoral neck fracture. The screws appear intact as visualized. There is foreshortened appearance of the left femoral neck with an area of angulation. Although this may be positional, a femoral neck fracture is not excluded. Further evaluation with CT is recommended. The bones are osteopenic. No dislocation. The soft tissues are unremarkable. IMPRESSION: Findings concerning for fracture of the left femoral neck. Further evaluation with CT recommended. Electronically Signed   By: Elgie CollardArash  Radparvar M.D.   On: 04/19/2021 02:54   CT Hip Left Wo Contrast  Result Date: 04/19/2021 CLINICAL DATA:  Hip trauma with fracture suspected. X-ray completed. EXAM: CT OF THE LEFT HIP WITHOUT CONTRAST TECHNIQUE: Multidetector CT imaging of the left hip was performed according to the standard protocol. Multiplanar CT image reconstructions were also generated. RADIATION DOSE REDUCTION: This exam was performed according to the departmental dose-optimization program which includes automated exposure control, adjustment of the mA and/or kV according to patient size and/or use of iterative reconstruction technique. COMPARISON:  Preceding radiography FINDINGS: Bones/Joint/Cartilage Subcapital left femoral neck fracture with posterior impaction. Mild for age degenerative spurring at the hip joint. No visible pelvic ring fracture. Ligaments Suboptimally assessed by  CT. Muscles and Tendons No evidence of major muscular ligamentous disruption. Soft tissues Expected swelling around the injury. The rectum and distal sigmoid are distended by stool. IMPRESSION: Subcapital left femoral neck fracture with posterolateral impaction. Electronically Signed   By: Tiburcio PeaJonathan  Watts M.D.   On: 04/19/2021 05:56   DG Chest Portable 1 View  Result Date: 04/19/2021 CLINICAL DATA:  Fall. EXAM: PORTABLE CHEST 1 VIEW COMPARISON:  Chest radiograph dated 02/11/2021. FINDINGS: No focal consolidation, pleural effusion or pneumothorax. Mild cardiomegaly. No acute osseous pathology. IMPRESSION:  1. No acute cardiopulmonary process. 2. Mild cardiomegaly. Electronically Signed   By: Elgie Collard M.D.   On: 04/19/2021 02:52   DG C-Arm 1-60 Min-No Report  Result Date: 04/20/2021 Fluoroscopy was utilized by the requesting physician.  No radiographic interpretation.   DG HIP UNILAT WITH PELVIS 2-3 VIEWS LEFT  Result Date: 04/20/2021 CLINICAL DATA:  Fracture neck of left femur EXAM: DG HIP (WITH OR WITHOUT PELVIS) 2-3V LEFT COMPARISON:  04/19/2021 FINDINGS: Fluoroscopic assistance was provided for internal fixation of subcapital fracture of neck of left femur with 3 surgical screws. Fluoroscopic time was 71 seconds. Radiation dose is 10 mGy. IMPRESSION: Fluoroscopic assistance was provided for internal fixation of subcapital fracture of proximal left femur. Electronically Signed   By: Ernie Avena M.D.   On: 04/20/2021 16:32      Subjective: Patient nonverbal.  No family in room.  Appears to be resting comfortably and did not appear in any pain or distress.  Discharge Exam:  Vitals:   04/22/21 0500 04/22/21 0615 04/22/21 0759 04/22/21 1512  BP:  (!) 149/93 (!) 143/110 135/86  Pulse:  76 (!) 42 88  Resp:  20 18 18   Temp:  98.2 F (36.8 C) 99.2 F (37.3 C) 98.6 F (37 C)  TempSrc:  Oral Oral Oral  SpO2:  94% 93% 94%  Weight: 53.5 kg     Height:        General exam:  Elderly female, small built, frail, chronically ill looking, sitting up in bed without discomfort. HEENT: Left periorbital ecchymosis.  Left lateral supraorbital area laceration with sutures intact.  Left eye without subconjunctival hemorrhage and pupil reacting to light. Respiratory system: Poor inspiratory effort but appears clear to auscultation.  No increased work of breathing. Cardiovascular system: S1 & S2 heard, RRR. No JVD, murmurs, rubs, gallops or clicks. No pedal edema.  Telemetry personally reviewed: Sinus rhythm, DC'd telemetry 1/13 Gastrointestinal system: Abdomen is nondistended, soft and nontender. No organomegaly or masses felt. Normal bowel sounds heard. Central nervous system: Eyes closed, at times mumbles incomprehensibly, cannot assess alertness.  Not oriented. No focal neurological deficits. Extremities: Symmetric 5 x 5 power in upper extremities.  Left hip surgical site dressing clean and dry. Skin: No rashes, lesions or ulcers Psychiatry: Judgement and insight impaired. Mood & affect cannot be assessed.     The results of significant diagnostics from this hospitalization (including imaging, microbiology, ancillary and laboratory) are listed below for reference.     Microbiology: Recent Results (from the past 240 hour(s))  Resp Panel by RT-PCR (Flu A&B, Covid) Nasopharyngeal Swab     Status: None   Collection Time: 04/19/21  6:08 AM   Specimen: Nasopharyngeal Swab; Nasopharyngeal(NP) swabs in vial transport medium  Result Value Ref Range Status   SARS Coronavirus 2 by RT PCR NEGATIVE NEGATIVE Final    Comment: (NOTE) SARS-CoV-2 target nucleic acids are NOT DETECTED.  The SARS-CoV-2 RNA is generally detectable in upper respiratory specimens during the acute phase of infection. The lowest concentration of SARS-CoV-2 viral copies this assay can detect is 138 copies/mL. A negative result does not preclude SARS-Cov-2 infection and should not be used as the sole basis  for treatment or other patient management decisions. A negative result may occur with  improper specimen collection/handling, submission of specimen other than nasopharyngeal swab, presence of viral mutation(s) within the areas targeted by this assay, and inadequate number of viral copies(<138 copies/mL). A negative result must be combined with clinical observations, patient history, and  epidemiological information. The expected result is Negative.  Fact Sheet for Patients:  BloggerCourse.com  Fact Sheet for Healthcare Providers:  SeriousBroker.it  This test is no t yet approved or cleared by the [place] FDA and  has been authorized for detection and/or diagnosis of SARS-CoV-2 by FDA under an Emergency Use Authorization (EUA). This EUA will remain  in effect (meaning this test can be used) for the duration of the COVID-19 declaration under Section 564(b)(1) of the Act, 21 U.S.C.section 360bbb-3(b)(1), unless the authorization is terminated  or revoked sooner.       Influenza A by PCR NEGATIVE NEGATIVE Final   Influenza B by PCR NEGATIVE NEGATIVE Final    Comment: (NOTE) The Xpert Xpress SARS-CoV-2/FLU/RSV plus assay is intended as an aid in the diagnosis of influenza from Nasopharyngeal swab specimens and should not be used as a sole basis for treatment. Nasal washings and aspirates are unacceptable for Xpert Xpress SARS-CoV-2/FLU/RSV testing.  Fact Sheet for Patients: BloggerCourse.com  Fact Sheet for Healthcare Providers: SeriousBroker.it  This test is not yet approved or cleared by the [place] FDA and has been authorized for detection and/or diagnosis of SARS-CoV-2 by FDA under an Emergency Use Authorization (EUA). This EUA will remain in effect (meaning this test can be used) for the duration of the COVID-19 declaration under Section 564(b)(1) of the Act, 21  U.S.C. section 360bbb-3(b)(1), unless the authorization is terminated or revoked.  Performed at [facility], [address], [place], [place] [address]      Labs: CBC: Recent Labs  Lab 04/19/21 0241 04/20/21 0338 04/21/21 0243 04/22/21 0030  WBC 7.4 6.5 6.9 7.2  NEUTROABS 6.4  --   --   --   HGB 10.3* 9.8* 9.2* 9.5*  HCT 35.5* 31.0* 29.8* 31.0*  MCV 102.9* 97.8 98.7 99.4  PLT 89* 81* 71* 78*    Basic Metabolic Panel: Recent Labs  Lab 04/19/21 0241 04/20/21 0338 04/21/21 0243  NA 140 140 139  K 4.2 3.8 4.0  CL 106 109 109  CO2 26 24 24   GLUCOSE 123* 94 120*  BUN 35* 27* 29*  CREATININE 0.76 0.83 0.74  CALCIUM 8.9 8.4* 7.9*    Liver Function Tests: No results for input(s): AST, ALT, ALKPHOS, BILITOT, PROT, ALBUMIN in the last 168 hours.  CBG: Recent Labs  Lab 04/19/21 2100 04/20/21 0655  GLUCAP 100* 88   I discussed in detail with patient's son, updated care and answered all questions.  He reiterated that at this time, main focus for patient is one of complete comfort.  Time coordinating discharge: 25 minutes  SIGNED:  06/18/21, MD,  FACP, [facility], [facility], [facility] (Care Management Physician Certified). [facility]  To contact the attending provider between 7A-7P or the covering provider during after hours 7P-7A, please log into the web site [email or web address] and access using universal [place] password for that web site. If you do not have the password, please call the hospital operator.

## 2021-05-10 DEATH — deceased
